# Patient Record
Sex: Female | Born: 1985 | Hispanic: No | Marital: Single | Smoking: Current every day smoker
Health system: Southern US, Community
[De-identification: ages and names within clinical notes are randomized; demographics above are authoritative.]

## PROBLEM LIST (undated history)

## (undated) ENCOUNTER — Inpatient Hospital Stay (HOSPITAL_COMMUNITY): Payer: Self-pay

## (undated) DIAGNOSIS — F329 Major depressive disorder, single episode, unspecified: Secondary | ICD-10-CM

## (undated) DIAGNOSIS — F99 Mental disorder, not otherwise specified: Secondary | ICD-10-CM

## (undated) DIAGNOSIS — Z7289 Other problems related to lifestyle: Secondary | ICD-10-CM

## (undated) DIAGNOSIS — F4310 Post-traumatic stress disorder, unspecified: Secondary | ICD-10-CM

## (undated) DIAGNOSIS — F32A Depression, unspecified: Secondary | ICD-10-CM

## (undated) DIAGNOSIS — R45851 Suicidal ideations: Secondary | ICD-10-CM

## (undated) DIAGNOSIS — F319 Bipolar disorder, unspecified: Secondary | ICD-10-CM

## (undated) HISTORY — PX: FOOT SURGERY: SHX648

## (undated) HISTORY — DX: Suicidal ideations: R45.851

---
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
## 2007-03-08 ENCOUNTER — Encounter: Admission: RE | Admit: 2007-03-08 | Discharge: 2007-04-02 | Payer: Self-pay | Admitting: Podiatry

## 2007-04-03 LAB — SICKLE CELL SCREEN: SICKLE CELL SCREEN: NORMAL

## 2007-04-30 ENCOUNTER — Emergency Department (HOSPITAL_COMMUNITY): Admission: EM | Admit: 2007-04-30 | Discharge: 2007-04-30 | Payer: Self-pay | Admitting: Emergency Medicine

## 2010-02-16 ENCOUNTER — Emergency Department (HOSPITAL_COMMUNITY)
Admission: EM | Admit: 2010-02-16 | Discharge: 2010-02-17 | Disposition: A | Discharge status: Other Acute Inpatient Hospital | Payer: Self-pay | Source: Home / Self Care | Admitting: Emergency Medicine

## 2010-02-17 ENCOUNTER — Inpatient Hospital Stay (HOSPITAL_COMMUNITY)
Admission: AD | Admit: 2010-02-17 | Discharge: 2010-02-22 | Payer: Self-pay | Source: Home / Self Care | Attending: Psychiatry | Admitting: Psychiatry

## 2010-02-22 LAB — ETHANOL
Alcohol, Ethyl (B): 296 mg/dL — ABNORMAL HIGH (ref 0–10)
Alcohol, Ethyl (B): 109 mg/dL — ABNORMAL HIGH (ref 0–10)

## 2010-02-22 LAB — BASIC METABOLIC PANEL
BUN: 3 mg/dL — ABNORMAL LOW (ref 6–23)
CO2: 27 mEq/L (ref 19–32)
Calcium: 9.3 mg/dL (ref 8.4–10.5)
Chloride: 113 mEq/L — ABNORMAL HIGH (ref 96–112)
Creatinine, Ser: 0.82 mg/dL (ref 0.4–1.2)
GFR calc Af Amer: >60 mL/min (ref >60)
GFR calc non Af Amer: >60 mL/min (ref >60)
Glucose, Bld: 108 mg/dL — ABNORMAL HIGH (ref 70–99)
Potassium: 3.7 mEq/L (ref 3.5–5.1)
Sodium: 145 mEq/L (ref 135–145)

## 2010-02-22 LAB — RAPID URINE DRUG SCREEN, HOSP PERFORMED
Amphetamines: NOT DETECTED
Barbiturates: NOT DETECTED
Benzodiazepines: NOT DETECTED
Cocaine: NOT DETECTED
Opiates: NOT DETECTED
Tetrahydrocannabinol: NOT DETECTED

## 2010-02-22 LAB — URINALYSIS, ROUTINE W REFLEX MICROSCOPIC
Bilirubin Urine: NEGATIVE
Hgb urine dipstick: NEGATIVE
Ketones, ur: NEGATIVE mg/dL
Nitrite: NEGATIVE
Protein, ur: NEGATIVE mg/dL
Specific Gravity, Urine: 1.017 (ref 1.005–1.030)
Urine Glucose, Fasting: NEGATIVE mg/dL
Urobilinogen, UA: 0.2 mg/dL (ref 0.0–1.0)
pH: 5.5 (ref 5.0–8.0)

## 2010-02-22 LAB — URINE MICROSCOPIC-ADD ON

## 2010-02-22 LAB — CBC
HCT: 42 % (ref 36.0–46.0)
Hemoglobin: 14.7 g/dL (ref 12.0–15.0)
MCH: 31.2 pg (ref 26.0–34.0)
MCHC: 35 g/dL (ref 30.0–36.0)
MCV: 89.2 fL (ref 78.0–100.0)
Platelets: 294 10*3/uL (ref 150–400)
RBC: 4.71 MIL/uL (ref 3.87–5.11)
RDW: 11.9 % (ref 11.5–15.5)
WBC: 6.9 10*3/uL (ref 4.0–10.5)

## 2010-02-22 LAB — DIFFERENTIAL
Basophils Absolute: 0.1 10*3/uL (ref 0.0–0.1)
Basophils Relative: 1 % (ref 0–1)
Eosinophils Absolute: 0.1 10*3/uL (ref 0.0–0.7)
Eosinophils Relative: 1 % (ref 0–5)
Lymphocytes Relative: 40 % (ref 12–46)
Lymphs Abs: 2.8 10*3/uL (ref 0.7–4.0)
Monocytes Absolute: 0.4 10*3/uL (ref 0.1–1.0)
Monocytes Relative: 5 % (ref 3–12)
Neutro Abs: 3.6 10*3/uL (ref 1.7–7.7)
Neutrophils Relative %: 53 % (ref 43–77)

## 2010-02-22 LAB — PREGNANCY, URINE: Preg Test, Ur: NEGATIVE

## 2010-02-22 LAB — TRICYCLICS SCREEN, URINE: TCA Scrn: NOT DETECTED

## 2010-03-04 NOTE — Discharge Summary (Addendum)
NAMESALLEE, HOGREFE NO.:  [id number]  MEDICAL RECORD NO.:  [id number]          PATIENT TYPE:  IPS  LOCATION:  0306                          FACILITY:  [facility]  PHYSICIAN:  Eulogio Ditch, MD DATE OF BIRTH:  04/01/1985  DATE OF ADMISSION:  02/17/2010 DATE OF DISCHARGE:  02/22/2010                              DISCHARGE SUMMARY   IDENTIFYING INFORMATION:  This is a 25 year old single African American female.  This is a voluntary admission.  HISTORY OF PRESENT ILLNESS:  Leathie presented by way of our emergency room where she was brought by EMS after she had been drinking alcohol and stated that she was depressed and felt suicidal.  She called EMS and reported this then hung up the phone.  The call was subsequently traced, found by EMS, and she was brought to the emergency room.  She was quite intoxicated and combative and verbally abusive at the time she was brought to the emergency room.  She has a history of major depressive disorder and possibly schizoaffective disorder and is followed at [facility].  She has a history of previous admissions to our adolescent unit, the last one at age 39 and a previous admission to [facility] [place] 68.  Her closest sibling is a sister who lives in [place].  She lives alone here in her own apartment.  Her parents died together when the patient was 25 years old from a drug overdose.  She had been in and out of foster care system during her childhood and teenage years.  She admits to drinking heavily, but denies any drug use.  MEDICAL EVALUATION AND DIAGNOSTIC STUDIES:  She was medically evaluated in the emergency room where she was found to have an alcohol level of 296 mg/dL.  Basic metabolic profile unremarkable.  BUN 3, creatinine 0.82.  Urine pregnancy test negative urine.  Drug screen also negative.  ADMITTING MEDICATIONS: 1. Trazodone 50 mg at bedtime. 2. Had previously  been on Abilify which had been stopped over a year     ago.  DRUG ALLERGIES:  None.  ADMITTING MENTAL STATUS EXAM:  Fully alert female, cooperative, blunt affect.  Fairly good eye contact.  Speech soft, soft in tone, barely audible sometimes, coherent, goal directed.  Affect depressed.  This is her mood.  Denying suicidal thoughts and blaming her actions and behavior on the alcohol.  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  DIAGNOSIS:  AXIS I:  Major depressive disorder recurrent, rule out schizoaffective disorder with paranoid thinking, alcohol abuse and rule out alcohol induced mood disorder. AXIS II: Borderline intellectual functioning. AXIS III: Asthma. AXIS IV: Burden of chronic mental illness and significant social isolation. AXIS V: Current 40.  COURSE OF HOSPITALIZATION:  She was admitted to our dual diagnosis unit and initially started on Seroquel 200 mg p.o. q.h.s. and trazodone 50 mg h.s. p.r.n. insomnia.  She did not require detox from alcohol while here.  Her affect and demeanor were very quiet and she did appear to be quite depressed.  She expressed that she only feels suicidal when drinking alone  and also admitted that she was kind of depressed, feeling that her life was going nowhere, always living alone, having no one except her sister over in [place].  After discharge, she planned on going staying with her sister about a week so that she did not need to be alone.  She was looking for work but was unable to find any.  She had previously been attending the [employer] [employer] [facility] [employer] [facility] [employer] [facility], but had stopped going there.  Because of her complaints of depression, she was started on Celexa 20 mg daily which, records indicated, that she had done well on previously.  She was started on Celexa on the 14th.  By the 15th, her affect was full, looking much better, requesting to go home and planning on going to stay with her sister.  Our counselor made  contact with the patient's sister, Wynona Canes, via phone.  Her sister expressed her concerns that something might happen to Hilja while she was drinking. The sister lives in [place] and is not physically close and was not able to identify anyone that could monitor the patient's behavior but believed the patient had no access to weapons which the patient had confirmed.  Her sister was receptive to receiving suicide prevention information.  DISCHARGE MENTAL STATUS EXAM:  By the 16th, she was in full contact with reality, pleasant, cooperative.  Affect broader she was looking forward to going to her own home, and then she was getting a ride to her sister's house.  No suicidal thoughts, positive thoughts about getting a job and going and visiting her sister, agreed to follow up with [facility].  DISCHARGE DIAGNOSIS:  AXIS I:  Depressive disorder NOS, alcohol abuse. AXIS II:  Borderline intellectual functioning. AXIS III:  No diagnosis. AXIS IV: Significant social isolation, having a supportive sister in [place] is an asset. AXIS V: Current 60, past year not known.  DISCHARGE CONDITION:  Stable.  DISCHARGE PLAN:  Follow up with [facility] and at [facility] on January 23 at 9:00 a.m. with Berton Mount, RN.  DISCHARGE MEDICATIONS: 1. Citalopram 20 mg daily. 2. Quetiapine XR 200 mg daily at bedtime. 3. Trazodone 50 mg h.s. p.r.n. insomnia.  She was instructed to stop     Abilify.     Margaret A. Lorin Picket, N.P.   ______________________________ Eulogio Ditch, MD    MAS/MEDQ  D:  02/25/2010  T:  02/25/2010  Job:  [id number]  Electronically Signed by Kari Baars N.P. on 02/26/2010 11:41:24 AM Electronically Signed by Eulogio Ditch  on 03/04/2010 05:31:37 AM

## 2010-03-10 ENCOUNTER — Emergency Department (HOSPITAL_COMMUNITY)
Admission: EM | Admit: 2010-03-10 | Discharge: 2010-03-11 | Disposition: A | Discharge status: Home / Self Care | Payer: Medicare Other | Attending: Emergency Medicine | Admitting: Emergency Medicine

## 2010-03-10 DIAGNOSIS — F1010 Alcohol abuse, uncomplicated: Secondary | ICD-10-CM | POA: Insufficient documentation

## 2010-03-10 DIAGNOSIS — F39 Unspecified mood [affective] disorder: Secondary | ICD-10-CM

## 2010-03-10 DIAGNOSIS — F3289 Other specified depressive episodes: Secondary | ICD-10-CM | POA: Insufficient documentation

## 2010-03-10 DIAGNOSIS — F329 Major depressive disorder, single episode, unspecified: Secondary | ICD-10-CM | POA: Insufficient documentation

## 2010-04-06 ENCOUNTER — Emergency Department (HOSPITAL_COMMUNITY)
Admission: EM | Admit: 2010-04-06 | Discharge: 2010-04-06 | Disposition: A | Discharge status: Other Acute Inpatient Hospital | Payer: Medicare Other | Source: Home / Self Care | Attending: Emergency Medicine | Admitting: Emergency Medicine

## 2010-04-06 ENCOUNTER — Inpatient Hospital Stay (HOSPITAL_COMMUNITY)
Admission: AD | Admit: 2010-04-06 | Discharge: 2010-04-09 | DRG: 885 | Disposition: A | Discharge status: Home / Self Care | Payer: Medicare Other | Source: Ambulatory Visit | Attending: Psychiatry | Admitting: Psychiatry

## 2010-04-06 DIAGNOSIS — F3289 Other specified depressive episodes: Secondary | ICD-10-CM | POA: Insufficient documentation

## 2010-04-06 DIAGNOSIS — Z609 Problem related to social environment, unspecified: Secondary | ICD-10-CM

## 2010-04-06 DIAGNOSIS — F17200 Nicotine dependence, unspecified, uncomplicated: Secondary | ICD-10-CM

## 2010-04-06 DIAGNOSIS — Z733 Stress, not elsewhere classified: Secondary | ICD-10-CM

## 2010-04-06 DIAGNOSIS — J45909 Unspecified asthma, uncomplicated: Secondary | ICD-10-CM

## 2010-04-06 DIAGNOSIS — R45851 Suicidal ideations: Secondary | ICD-10-CM | POA: Insufficient documentation

## 2010-04-06 DIAGNOSIS — T43502A Poisoning by unspecified antipsychotics and neuroleptics, intentional self-harm, initial encounter: Secondary | ICD-10-CM | POA: Insufficient documentation

## 2010-04-06 DIAGNOSIS — T438X2A Poisoning by other psychotropic drugs, intentional self-harm, initial encounter: Secondary | ICD-10-CM | POA: Insufficient documentation

## 2010-04-06 DIAGNOSIS — Z79899 Other long term (current) drug therapy: Secondary | ICD-10-CM | POA: Insufficient documentation

## 2010-04-06 DIAGNOSIS — F329 Major depressive disorder, single episode, unspecified: Principal | ICD-10-CM

## 2010-04-06 DIAGNOSIS — T43294A Poisoning by other antidepressants, undetermined, initial encounter: Secondary | ICD-10-CM | POA: Insufficient documentation

## 2010-04-06 DIAGNOSIS — F1020 Alcohol dependence, uncomplicated: Secondary | ICD-10-CM

## 2010-04-06 LAB — RAPID URINE DRUG SCREEN, HOSP PERFORMED
Amphetamines: NOT DETECTED
Barbiturates: NOT DETECTED
Benzodiazepines: NOT DETECTED
Cocaine: NOT DETECTED
Opiates: NOT DETECTED
Tetrahydrocannabinol: NOT DETECTED

## 2010-04-06 LAB — DIFFERENTIAL
Basophils Absolute: 0 10*3/uL (ref 0.0–0.1)
Basophils Relative: 0 % (ref 0–1)
Eosinophils Absolute: 0 10*3/uL (ref 0.0–0.7)
Eosinophils Relative: 0 % (ref 0–5)
Lymphocytes Relative: 28 % (ref 12–46)
Lymphs Abs: 2.5 10*3/uL (ref 0.7–4.0)
Monocytes Absolute: 0.6 10*3/uL (ref 0.1–1.0)
Monocytes Relative: 7 % (ref 3–12)
Neutro Abs: 5.7 10*3/uL (ref 1.7–7.7)
Neutrophils Relative %: 65 % (ref 43–77)

## 2010-04-06 LAB — CBC
HCT: 39 % (ref 36.0–46.0)
Hemoglobin: 13.1 g/dL (ref 12.0–15.0)
MCH: 29.6 pg (ref 26.0–34.0)
MCHC: 33.6 g/dL (ref 30.0–36.0)
MCV: 88.2 fL (ref 78.0–100.0)
Platelets: 259 10*3/uL (ref 150–400)
RBC: 4.42 MIL/uL (ref 3.87–5.11)
RDW: 11.7 % (ref 11.5–15.5)
WBC: 8.8 10*3/uL (ref 4.0–10.5)

## 2010-04-06 LAB — URINALYSIS, ROUTINE W REFLEX MICROSCOPIC
Bilirubin Urine: NEGATIVE
Hgb urine dipstick: NEGATIVE
Ketones, ur: NEGATIVE mg/dL
Nitrite: NEGATIVE
Protein, ur: NEGATIVE mg/dL
Specific Gravity, Urine: 1.011 (ref 1.005–1.030)
Urine Glucose, Fasting: NEGATIVE mg/dL
Urobilinogen, UA: 0.2 mg/dL (ref 0.0–1.0)
pH: 5.5 (ref 5.0–8.0)

## 2010-04-06 LAB — COMPREHENSIVE METABOLIC PANEL
ALT: 10 U/L (ref 0–35)
AST: 15 U/L (ref 0–37)
Albumin: 4.4 g/dL (ref 3.5–5.2)
Alkaline Phosphatase: 51 U/L (ref 39–117)
BUN: 5 mg/dL — ABNORMAL LOW (ref 6–23)
CO2: 22 mEq/L (ref 19–32)
Calcium: 9 mg/dL (ref 8.4–10.5)
Chloride: 108 mEq/L (ref 96–112)
Creatinine, Ser: 0.79 mg/dL (ref 0.4–1.2)
GFR calc Af Amer: >60 mL/min (ref >60)
GFR calc non Af Amer: >60 mL/min (ref >60)
Glucose, Bld: 99 mg/dL (ref 70–99)
Potassium: 3.6 mEq/L (ref 3.5–5.1)
Sodium: 138 mEq/L (ref 135–145)
Total Bilirubin: 0.7 mg/dL (ref 0.3–1.2)
Total Protein: 7.1 g/dL (ref 6.0–8.3)

## 2010-04-06 LAB — ETHANOL
Alcohol, Ethyl (B): 172 mg/dL — ABNORMAL HIGH (ref 0–10)
Alcohol, Ethyl (B): 69 mg/dL — ABNORMAL HIGH (ref 0–10)

## 2010-04-06 LAB — ACETAMINOPHEN LEVEL: Acetaminophen (Tylenol), Serum: <10 ug/mL — ABNORMAL LOW (ref 10–30)

## 2010-04-06 LAB — PREGNANCY, URINE: Preg Test, Ur: NEGATIVE

## 2010-04-06 LAB — SALICYLATE LEVEL: Salicylate Lvl: <4 mg/dL (ref 2.8–20.0)

## 2010-04-07 DIAGNOSIS — F329 Major depressive disorder, single episode, unspecified: Secondary | ICD-10-CM

## 2010-04-07 DIAGNOSIS — F1010 Alcohol abuse, uncomplicated: Secondary | ICD-10-CM

## 2010-04-07 NOTE — H&P (Addendum)
NAME:  Nichole Lopez, Nichole Lopez NO.:  [id number]  MEDICAL RECORD NO.:  [id number]           PATIENT TYPE:  I  LOCATION:  0306                          FACILITY:  [facility]  PHYSICIAN:  Anselm Jungling, MD  DATE OF BIRTH:  [birth year]  DATE OF ADMISSION:  04/06/2010 DATE OF DISCHARGE:                      PSYCHIATRIC ADMISSION ASSESSMENT   PSYCHIATRIC ADMISSION ASSESSMENT:  Admission information:  The patient is a 25 year old African American female patient who presented to the [facility] emergency room after overdosing on her medication after an argument with her boyfriend while quite inebriated.  PAST PSYCHIATRIC HISTORY:  This is the second admission at [facility] for Nichole Lopez.  Her last admission was in January of this year and she was admitted and treated.  PAST PSYCHIATRIC HISTORY:  __________  one admission at age 89, second admission in child adolescent unit at 46.  She has finished the eighth grade, was not in regular classes and has no GED.  She is the second oldest of 4 children.  Older sister and two younger brothers.  Two brothers are in [place].  The sister is in [place].  She lives alone and is followed as a payee from DSS by Charlette Caffey and goes through [facility] and gets her [facility] through the [facility].  FAMILY HISTORY:  Parents died together when the patient was 25 years old from a drug overdose.  She has been in and out of foster care during that time.  She is currently on disability for major depressive disorder, schizoaffective disorder, paranoid schizophrenia and possible bipolar disorder.  ALCOHOL AND DRUG HISTORY:  The patient says she had her first alcohol at age 34 and denies any drug usage.  She smokes half a pack a day.  She sees a medical provider at [facility].  MEDICAL PROBLEMS:  Significant for asthma.  CURRENT MEDICATIONS: 1. Citalopram 20 mg p.o. daily. 2. Quetiapine XR 200 mg  daily at bedtime. 3. Trazodone 50 mg p.r.n. insomnia.  DRUG ALLERGIES:  No known drug allergies.  The patient was seen in the emergency room and taken there by EMS at 3 a.m. after an argument with her boyfriend where she took 11 trazodone pills after drinking quite heavily.  Physical exam was unremarkable and the patient appeared depressed.  SIGNIFICANT LABORATORY:  Included negative urine pregnancy, negative urine microscopic.  Urine drug screen was negative.  CBC was normal. Acetaminophen level less than 10.  Comprehensive metabolic panel was normal with a low BUN of 5.  Salicylate was less than 4 and blood alcohol was significant at 172.  The patient denies any previous history of seizure disorder or withdrawal symptoms.  MENTAL STATUS EXAM:  The patient states that she was not suicidal at the time, she just wanted to go to the emergency room to have her stomach pumped and then go home.  She is wearing paper scrubs, appears depressed.  She is cooperative.  Hair is combed.  She makes poor eye contact.  Speech is clear and goal oriented and is coherent.  Mood is depressed.  Affect is restricted.  Thought process appears linear however, she has absolutely no  insight and very poor judgment.  She has obvious decreased intellectual functioning.  ASSESSMENT:  Axis I:  Major depressive disorder, not otherwise specified.  Alcohol abuse. Axis II:  Borderline intellectual functioning. Axis III:  None. Status post overdose without significant sequelae. Axis IV:  Significant social isolation. Axis V:  40, past year 74.  PLAN:  Admit for stabilization and to follow for withdrawal symptoms. Case manager and therapist will contact DSS and [facility] to follow up on more supportive interaction between the patient and client.  Estimated length of stay 2-4 days.    ______________________________ Verne Spurr, PA   ______________________________ Anselm Jungling,  MD    NM/MEDQ  D:  04/07/2010  T:  04/07/2010  Job:  [id number]  Electronically Signed by Geralyn Flash MD on 04/07/2010 12:56:43 PM Electronically Signed by Verne Spurr  on 05/18/2010 09:48:57 AM

## 2010-04-08 DIAGNOSIS — F329 Major depressive disorder, single episode, unspecified: Secondary | ICD-10-CM

## 2010-04-12 NOTE — Discharge Summary (Signed)
NAME:  [name], [name] NO.:  [id number]  MEDICAL RECORD NO.:  [id number]           PATIENT TYPE:  I  LOCATION:  0306                          FACILITY:  BH  PHYSICIAN:  Anselm Jungling, MD  DATE OF BIRTH:  02/01/1986  DATE OF ADMISSION:  04/06/2010 DATE OF DISCHARGE:  04/09/2010                              DISCHARGE SUMMARY   IDENTIFYING DATA/REASON FOR ADMISSION:  This was an inpatient psychiatric admission for Nichole Lopez, a 25 year old single African American female and client of the [facility]. She was admitted because of increasing features of depression.  Please refer to the admission note for further details pertaining to the symptoms, circumstances and history that led to her hospitalization. She was given an initial Axis I diagnosis of depressive disorder NOS and history of alcohol abuse.  MEDICAL/LABORATORY:  The patient was medically and physically assessed by the psychiatric nurse practitioner.  She had come to [place] through the emergency department where she had been evaluated after an overdose. There were no significant medical issues during her stay.  HOSPITAL COURSE:  The patient was admitted to the adult inpatient psychiatric service.  She presented as a well-nourished, normally- developed young adult female who was pleasant, but depressed and very sad.  She spoke with a soft voice and tended to avoid eye contact.  She appeared very shy and under confident.  She stated that it was her idea to come into this inpatient setting to get help, but she could not really state why.  She acknowledged having taken some form of overdose prior to admission, but stated that she did not know why she had tried to kill herself.  She indicated fairly quickly that she did not want to remain in the inpatient program, but she was felt to be in need of further evaluation and treatment.  She was continued on her usual regimen of  Seroquel 200 mg nightly.  She was also given trazodone to assist with sleep which was helpful.  We contacted her assertive community treatment team and the case manager coordinated the aftercare and discharge plan with them.  The patient appeared appropriate for discharge on the fourth hospital day.  She had been consistently absent suicidal ideation through that time and was ready and willing to continue working with the [employer].  Other plans of hers that indicated strong future orientation included a thought of returning to school at [employer] to earn her GED.  She agreed to the following aftercare plan.  AFTERCARE:  The patient was to be picked up by [facility] immediately upon discharge.  She was also to follow up with [employer] with an appointment on April 14, 2010 at 4 p.m.  DISCHARGE MEDICATIONS:  Seroquel 200 mg nightly.  DISCHARGE DIAGNOSES:  AXIS I:  Depressive disorder not otherwise specified and history of alcohol dependence. AXIS II:  Deferred. AXIS III:  No acute or chronic illnesses. AXIS IV:  Stressors severe. AXIS V:  Global assessment of functioning on discharge 47.     Anselm Jungling, MD  SPB/MEDQ  D:  04/09/2010  T:  04/10/2010  Job:  [id number]  Electronically Signed by Geralyn Flash MD on 04/12/2010 11:04:32 AM
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
## 2010-07-15 ENCOUNTER — Emergency Department (HOSPITAL_COMMUNITY)
Admission: EM | Admit: 2010-07-15 | Discharge: 2010-07-15 | Disposition: A | Discharge status: Home / Self Care | Payer: Medicare Other | Attending: Emergency Medicine | Admitting: Emergency Medicine

## 2010-07-15 DIAGNOSIS — F329 Major depressive disorder, single episode, unspecified: Secondary | ICD-10-CM | POA: Insufficient documentation

## 2010-07-15 DIAGNOSIS — F458 Other somatoform disorders: Secondary | ICD-10-CM | POA: Insufficient documentation

## 2010-07-15 DIAGNOSIS — R6884 Jaw pain: Secondary | ICD-10-CM | POA: Insufficient documentation

## 2010-07-15 DIAGNOSIS — F3289 Other specified depressive episodes: Secondary | ICD-10-CM | POA: Insufficient documentation

## 2010-07-22 ENCOUNTER — Inpatient Hospital Stay (INDEPENDENT_AMBULATORY_CARE_PROVIDER_SITE_OTHER)
Admission: RE | Admit: 2010-07-22 | Discharge: 2010-07-22 | Disposition: A | Discharge status: Home / Self Care | Payer: Medicare Other | Source: Ambulatory Visit | Attending: Family Medicine | Admitting: Family Medicine

## 2010-07-22 DIAGNOSIS — M26609 Unspecified temporomandibular joint disorder, unspecified side: Secondary | ICD-10-CM

## 2010-08-25 ENCOUNTER — Inpatient Hospital Stay (INDEPENDENT_AMBULATORY_CARE_PROVIDER_SITE_OTHER)
Admission: RE | Admit: 2010-08-25 | Discharge: 2010-08-25 | Disposition: A | Discharge status: Home / Self Care | Payer: Medicare Other | Source: Ambulatory Visit | Attending: Family Medicine | Admitting: Family Medicine

## 2010-08-25 DIAGNOSIS — R109 Unspecified abdominal pain: Secondary | ICD-10-CM

## 2010-08-25 LAB — POCT URINALYSIS DIP (DEVICE)
Bilirubin Urine: NEGATIVE
Glucose, UA: NEGATIVE mg/dL
Hgb urine dipstick: NEGATIVE
Leukocytes, UA: NEGATIVE
Nitrite: NEGATIVE
Protein, ur: NEGATIVE mg/dL
Specific Gravity, Urine: >=1.03 (ref 1.005–1.030)
Urobilinogen, UA: 0.2 mg/dL (ref 0.0–1.0)
pH: 5 (ref 5.0–8.0)

## 2010-08-25 LAB — POCT PREGNANCY, URINE: Preg Test, Ur: NEGATIVE

## 2010-09-14 ENCOUNTER — Emergency Department (HOSPITAL_COMMUNITY)
Admission: EM | Admit: 2010-09-14 | Discharge: 2010-09-15 | Disposition: A | Discharge status: Home / Self Care | Payer: Medicare Other | Attending: Emergency Medicine | Admitting: Emergency Medicine

## 2010-09-14 DIAGNOSIS — F1010 Alcohol abuse, uncomplicated: Secondary | ICD-10-CM | POA: Insufficient documentation

## 2010-09-14 DIAGNOSIS — IMO0002 Reserved for concepts with insufficient information to code with codable children: Secondary | ICD-10-CM | POA: Insufficient documentation

## 2010-09-15 LAB — CBC
MCV: 85.2 fL (ref 78.0–100.0)
Platelets: 305 10*3/uL (ref 150–400)
RBC: 4.99 MIL/uL (ref 3.87–5.11)
RDW: 12.3 % (ref 11.5–15.5)
WBC: 7.7 10*3/uL (ref 4.0–10.5)

## 2010-09-15 LAB — RAPID URINE DRUG SCREEN, HOSP PERFORMED
Barbiturates: NOT DETECTED
Benzodiazepines: NOT DETECTED
Cocaine: NOT DETECTED
Tetrahydrocannabinol: NOT DETECTED

## 2010-09-15 LAB — COMPREHENSIVE METABOLIC PANEL
ALT: 8 U/L (ref 0–35)
AST: 13 U/L (ref 0–37)
Albumin: 5 g/dL (ref 3.5–5.2)
Alkaline Phosphatase: 55 U/L (ref 39–117)
CO2: 20 mEq/L (ref 19–32)
Chloride: 105 mEq/L (ref 96–112)
GFR calc non Af Amer: >60 mL/min (ref >60)
Potassium: 3.8 mEq/L (ref 3.5–5.1)
Sodium: 139 mEq/L (ref 135–145)
Total Bilirubin: 0.3 mg/dL (ref 0.3–1.2)

## 2010-09-15 LAB — DIFFERENTIAL
Basophils Absolute: 0.1 10*3/uL (ref 0.0–0.1)
Eosinophils Absolute: 0 10*3/uL (ref 0.0–0.7)
Eosinophils Relative: 0 % (ref 0–5)
Lymphs Abs: 2.4 10*3/uL (ref 0.7–4.0)
Neutrophils Relative %: 63 % (ref 43–77)

## 2010-09-24 ENCOUNTER — Emergency Department (HOSPITAL_COMMUNITY)
Admission: EM | Admit: 2010-09-24 | Discharge: 2010-09-25 | Disposition: A | Discharge status: Home / Self Care | Payer: Medicare Other | Attending: Emergency Medicine | Admitting: Emergency Medicine

## 2010-09-24 DIAGNOSIS — R4182 Altered mental status, unspecified: Secondary | ICD-10-CM | POA: Insufficient documentation

## 2010-09-24 DIAGNOSIS — F1010 Alcohol abuse, uncomplicated: Secondary | ICD-10-CM | POA: Insufficient documentation

## 2010-09-24 DIAGNOSIS — F329 Major depressive disorder, single episode, unspecified: Secondary | ICD-10-CM | POA: Insufficient documentation

## 2010-09-24 DIAGNOSIS — F3289 Other specified depressive episodes: Secondary | ICD-10-CM | POA: Insufficient documentation

## 2010-09-24 LAB — URINALYSIS, ROUTINE W REFLEX MICROSCOPIC
Glucose, UA: NEGATIVE mg/dL
Leukocytes, UA: NEGATIVE
pH: 6.5 (ref 5.0–8.0)

## 2010-09-24 LAB — DIFFERENTIAL
Basophils Relative: 1 % (ref 0–1)
Eosinophils Absolute: 0 10*3/uL (ref 0.0–0.7)
Eosinophils Relative: 1 % (ref 0–5)
Lymphs Abs: 2.3 10*3/uL (ref 0.7–4.0)
Monocytes Absolute: 0.3 10*3/uL (ref 0.1–1.0)
Monocytes Relative: 5 % (ref 3–12)
Neutrophils Relative %: 53 % (ref 43–77)

## 2010-09-24 LAB — CBC
MCH: 30.1 pg (ref 26.0–34.0)
MCHC: 35.4 g/dL (ref 30.0–36.0)
MCV: 85.1 fL (ref 78.0–100.0)
Platelets: 285 10*3/uL (ref 150–400)
RBC: 4.82 MIL/uL (ref 3.87–5.11)

## 2010-09-24 LAB — RAPID URINE DRUG SCREEN, HOSP PERFORMED
Cocaine: NOT DETECTED
Opiates: NOT DETECTED

## 2010-09-24 LAB — BASIC METABOLIC PANEL
BUN: 4 mg/dL — ABNORMAL LOW (ref 6–23)
CO2: 20 mEq/L (ref 19–32)
Calcium: 9.7 mg/dL (ref 8.4–10.5)
Glucose, Bld: 100 mg/dL — ABNORMAL HIGH (ref 70–99)
Sodium: 139 mEq/L (ref 135–145)

## 2010-09-24 LAB — PREGNANCY, URINE: Preg Test, Ur: NEGATIVE

## 2010-09-24 LAB — URINE MICROSCOPIC-ADD ON

## 2010-10-18 ENCOUNTER — Emergency Department (HOSPITAL_COMMUNITY)
Admission: EM | Admit: 2010-10-18 | Discharge: 2010-10-19 | Disposition: A | Discharge status: Home / Self Care | Payer: Medicare Other | Attending: Emergency Medicine | Admitting: Emergency Medicine

## 2010-10-18 DIAGNOSIS — F411 Generalized anxiety disorder: Secondary | ICD-10-CM | POA: Insufficient documentation

## 2010-10-18 DIAGNOSIS — F1010 Alcohol abuse, uncomplicated: Secondary | ICD-10-CM | POA: Insufficient documentation

## 2010-10-18 DIAGNOSIS — IMO0002 Reserved for concepts with insufficient information to code with codable children: Secondary | ICD-10-CM | POA: Insufficient documentation

## 2010-10-18 LAB — URINALYSIS, ROUTINE W REFLEX MICROSCOPIC
Glucose, UA: NEGATIVE mg/dL
Leukocytes, UA: NEGATIVE
Specific Gravity, Urine: 1.006 (ref 1.005–1.030)
pH: 6 (ref 5.0–8.0)

## 2010-10-18 LAB — CBC
Hemoglobin: 13.9 g/dL (ref 12.0–15.0)
MCH: 30.5 pg (ref 26.0–34.0)
MCHC: 35 g/dL (ref 30.0–36.0)

## 2010-10-18 LAB — DIFFERENTIAL
Basophils Relative: 1 % (ref 0–1)
Eosinophils Absolute: 0 10*3/uL (ref 0.0–0.7)
Eosinophils Relative: 0 % (ref 0–5)
Monocytes Absolute: 0.2 10*3/uL (ref 0.1–1.0)
Monocytes Relative: 4 % (ref 3–12)
Neutro Abs: 3.5 10*3/uL (ref 1.7–7.7)

## 2010-10-18 LAB — RAPID URINE DRUG SCREEN, HOSP PERFORMED
Amphetamines: NOT DETECTED
Benzodiazepines: NOT DETECTED
Cocaine: NOT DETECTED
Opiates: NOT DETECTED

## 2010-10-18 LAB — COMPREHENSIVE METABOLIC PANEL
AST: 19 U/L (ref 0–37)
CO2: 21 mEq/L (ref 19–32)
Calcium: 9.4 mg/dL (ref 8.4–10.5)
Creatinine, Ser: 0.6 mg/dL (ref 0.50–1.10)
GFR calc Af Amer: >60 mL/min (ref >60)
GFR calc non Af Amer: >60 mL/min (ref >60)

## 2010-10-18 LAB — ACETAMINOPHEN LEVEL: Acetaminophen (Tylenol), Serum: <15 ug/mL (ref 10–30)

## 2010-11-01 LAB — STREP A DNA PROBE: Group A Strep Probe: NEGATIVE

## 2010-12-30 ENCOUNTER — Inpatient Hospital Stay (HOSPITAL_COMMUNITY)
Admission: AD | Admit: 2010-12-30 | Discharge: 2011-01-03 | DRG: 882 | Disposition: A | Discharge status: Home / Self Care | Payer: Medicare Other | Attending: Psychiatry | Admitting: Psychiatry

## 2010-12-30 ENCOUNTER — Encounter (HOSPITAL_COMMUNITY): Payer: Self-pay | Admitting: Behavioral Health

## 2010-12-30 ENCOUNTER — Emergency Department (HOSPITAL_COMMUNITY)
Admission: EM | Admit: 2010-12-30 | Discharge: 2010-12-30 | Disposition: A | Discharge status: Home / Self Care | Payer: Medicare Other | Source: Home / Self Care | Attending: Emergency Medicine | Admitting: Emergency Medicine

## 2010-12-30 ENCOUNTER — Encounter: Payer: Self-pay | Admitting: Emergency Medicine

## 2010-12-30 DIAGNOSIS — Z818 Family history of other mental and behavioral disorders: Secondary | ICD-10-CM

## 2010-12-30 DIAGNOSIS — X838XXA Intentional self-harm by other specified means, initial encounter: Secondary | ICD-10-CM

## 2010-12-30 DIAGNOSIS — F1994 Other psychoactive substance use, unspecified with psychoactive substance-induced mood disorder: Secondary | ICD-10-CM

## 2010-12-30 DIAGNOSIS — F609 Personality disorder, unspecified: Secondary | ICD-10-CM

## 2010-12-30 DIAGNOSIS — S61209A Unspecified open wound of unspecified finger without damage to nail, initial encounter: Secondary | ICD-10-CM

## 2010-12-30 DIAGNOSIS — IMO0002 Reserved for concepts with insufficient information to code with codable children: Secondary | ICD-10-CM

## 2010-12-30 DIAGNOSIS — S41109A Unspecified open wound of unspecified upper arm, initial encounter: Secondary | ICD-10-CM

## 2010-12-30 DIAGNOSIS — F4310 Post-traumatic stress disorder, unspecified: Principal | ICD-10-CM

## 2010-12-30 DIAGNOSIS — Z6379 Other stressful life events affecting family and household: Secondary | ICD-10-CM

## 2010-12-30 DIAGNOSIS — F1010 Alcohol abuse, uncomplicated: Secondary | ICD-10-CM

## 2010-12-30 DIAGNOSIS — F603 Borderline personality disorder: Secondary | ICD-10-CM

## 2010-12-30 DIAGNOSIS — R45851 Suicidal ideations: Secondary | ICD-10-CM

## 2010-12-30 DIAGNOSIS — F509 Eating disorder, unspecified: Secondary | ICD-10-CM

## 2010-12-30 HISTORY — DX: Major depressive disorder, single episode, unspecified: F32.9

## 2010-12-30 HISTORY — DX: Mental disorder, not otherwise specified: F99

## 2010-12-30 HISTORY — DX: Depression, unspecified: F32.A

## 2010-12-30 LAB — DIFFERENTIAL
Basophils Relative: 1 % (ref 0–1)
Lymphocytes Relative: 31 % (ref 12–46)
Lymphs Abs: 1.8 10*3/uL (ref 0.7–4.0)
Monocytes Relative: 4 % (ref 3–12)
Neutro Abs: 3.8 10*3/uL (ref 1.7–7.7)
Neutrophils Relative %: 64 % (ref 43–77)

## 2010-12-30 LAB — BASIC METABOLIC PANEL
GFR calc Af Amer: >90 mL/min (ref >90)
GFR calc non Af Amer: >90 mL/min (ref >90)
Potassium: 3.7 mEq/L (ref 3.5–5.1)
Sodium: 137 mEq/L (ref 135–145)

## 2010-12-30 LAB — RAPID URINE DRUG SCREEN, HOSP PERFORMED: Benzodiazepines: NOT DETECTED

## 2010-12-30 LAB — URINALYSIS, ROUTINE W REFLEX MICROSCOPIC
Glucose, UA: NEGATIVE mg/dL
Protein, ur: NEGATIVE mg/dL
pH: 5.5 (ref 5.0–8.0)

## 2010-12-30 LAB — CBC
Hemoglobin: 14.8 g/dL (ref 12.0–15.0)
RBC: 4.86 MIL/uL (ref 3.87–5.11)

## 2010-12-30 LAB — PREGNANCY, URINE: Preg Test, Ur: NEGATIVE

## 2010-12-30 LAB — URINE MICROSCOPIC-ADD ON

## 2010-12-30 MED ORDER — IBUPROFEN 600 MG PO TABS
600.0000 mg | ORAL_TABLET | Freq: Three times a day (TID) | ORAL | Status: DC | PRN
  Administered 2010-12-30 – 2011-01-03 (×2): 600 mg via ORAL
  Filled 2010-12-30 (×3): qty 1

## 2010-12-30 MED ORDER — LORAZEPAM 1 MG PO TABS: 1.0000 mg | ORAL_TABLET | Freq: Three times a day (TID) | ORAL | Status: DC | PRN

## 2010-12-30 MED ORDER — ONDANSETRON HCL 4 MG PO TABS: 4.0000 mg | ORAL_TABLET | Freq: Three times a day (TID) | ORAL | Status: DC | PRN

## 2010-12-30 MED ORDER — ALUM & MAG HYDROXIDE-SIMETH 200-200-20 MG/5ML PO SUSP: 30.0000 mL | ORAL | Status: DC | PRN

## 2010-12-30 MED ORDER — ACETAMINOPHEN 325 MG PO TABS: 650.0000 mg | ORAL_TABLET | ORAL | Status: DC | PRN

## 2010-12-30 MED ORDER — PNEUMOCOCCAL VAC POLYVALENT 25 MCG/0.5ML IJ INJ
0.5000 mL | INJECTION | INTRAMUSCULAR | Status: AC
  Administered 2010-12-31: 0.5 mL via INTRAMUSCULAR

## 2010-12-30 MED ORDER — IBUPROFEN 200 MG PO TABS: 600.0000 mg | ORAL_TABLET | Freq: Three times a day (TID) | ORAL | Status: DC | PRN

## 2010-12-30 MED ORDER — ZOLPIDEM TARTRATE 5 MG PO TABS: 5.0000 mg | ORAL_TABLET | Freq: Every evening | ORAL | Status: DC | PRN

## 2010-12-30 MED ORDER — MAGNESIUM HYDROXIDE 400 MG/5ML PO SUSP: 30.0000 mL | Freq: Every day | ORAL | Status: DC | PRN

## 2010-12-30 MED ORDER — NICOTINE 21 MG/24HR TD PT24: 21.0000 mg | MEDICATED_PATCH | Freq: Every day | TRANSDERMAL | Status: DC

## 2010-12-30 MED ORDER — TRAZODONE HCL 50 MG PO TABS
50.0000 mg | ORAL_TABLET | Freq: Every evening | ORAL | Status: DC | PRN
  Administered 2010-12-31 – 2011-01-02 (×3): 50 mg via ORAL
  Filled 2010-12-30 (×3): qty 1

## 2010-12-30 MED ORDER — CHLORDIAZEPOXIDE HCL 25 MG PO CAPS
25.0000 mg | ORAL_CAPSULE | Freq: Four times a day (QID) | ORAL | Status: DC | PRN
  Administered 2010-12-30 – 2010-12-31 (×2): 25 mg via ORAL
  Filled 2010-12-30 (×3): qty 1

## 2010-12-30 MED ORDER — NICOTINE 21 MG/24HR TD PT24
21.0000 mg | MEDICATED_PATCH | Freq: Every day | TRANSDERMAL | Status: DC
  Administered 2010-12-30 – 2011-01-02 (×3): 21 mg via TRANSDERMAL
  Filled 2010-12-30 (×4): qty 1
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
## 2010-12-30 NOTE — ED Notes (Signed)
Breakfast tray served---pt. Eating from tray--police at bedside.
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
## 2010-12-30 NOTE — ED Notes (Signed)
Pt ambulated to the restroom and void, sample collected at that time. Pt also changed into blue scrubs.

## 2010-12-30 NOTE — ED Provider Notes (Signed)
History     CSN: [id number] Arrival date & time: 12/30/2010  2:26 AM   First MD Initiated Contact with Patient 12/30/10 0247      Chief Complaint  Patient presents with  . Extremity Laceration    (Consider location/radiation/quality/duration/timing/severity/associated sxs/prior treatment) The history is provided by the patient.    Pt presents to the ED by [employer] for cutting her own for arm multiples times and being suicidal. She states that she is suicidal and will kill herself if she goes home because she was listening to depressed music and is sad. Pt is currently intoxicated and is here voluntarily, she states that she needs help and that she drinks a lot. Pt denies any illicit drugs. PTs behavior is inappropriate. Unable to assess pt and do a proper HPI due to intoxication.  History reviewed. No pertinent past medical history.  Past Surgical History  Procedure Date  . Foot surgery     Family History  Problem Relation Age of Onset  . Diabetes Other     History  Substance Use Topics  . Smoking status: Current Everyday Smoker -- 0.5 packs/day    Types: Cigarettes  . Smokeless tobacco: Not on file  . Alcohol Use: Yes     Heavy    OB History    Grav Para Term Preterm Abortions TAB SAB Ect Mult Living                  Review of Systems  Unable to perform ROS   Allergies  Review of patient's allergies indicates no known allergies.  Home Medications  No current outpatient prescriptions on file.  BP 138/90  Pulse 125  Temp(Src) 98.8 F (37.1 C) (Oral)  Resp 18  SpO2 100%  LMP 12/12/2010  Physical Exam  Constitutional: She appears well-developed and well-nourished.  HENT:  Head: Normocephalic and atraumatic.  Eyes: Conjunctivae are normal. Pupils are equal, round, and reactive to light.  Neck: Trachea normal, normal range of motion and full passive range of motion without pain. Neck supple.  Cardiovascular: Normal rate, regular rhythm and normal pulses.     Pulmonary/Chest: Effort normal and breath sounds normal. Chest wall is not dull to percussion. She exhibits no tenderness, no crepitus, no edema, no deformity and no retraction.  Abdominal: Soft. Normal appearance and bowel sounds are normal.  Musculoskeletal: Normal range of motion.  Lymphadenopathy:       Head (right side): No submental, no submandibular, no tonsillar, no preauricular, no posterior auricular and no occipital adenopathy present.       Head (left side): No submental, no submandibular, no tonsillar, no preauricular, no posterior auricular and no occipital adenopathy present.    She has no cervical adenopathy.    She has no axillary adenopathy.  Neurological: She is alert. She has normal strength.  Skin: Skin is warm, dry and intact.     Psychiatric: Her affect is blunt and inappropriate. Her speech is slurred (pt is intoxicated on alcohol). Cognition and memory are normal. She expresses impulsivity and inappropriate judgment. She expresses homicidal and suicidal ideation. She expresses suicidal plans.    ED Course  Procedures (including critical care time)   Labs Reviewed  BASIC METABOLIC PANEL  CBC  DIFFERENTIAL  DRUGS OF ABUSE SCREEN W ALC, ROUTINE URINE  ETHANOL  URINALYSIS, ROUTINE W REFLEX MICROSCOPIC  PREGNANCY, URINE   No results found.   No diagnosis found.    MDM  Waiting to be assessed by ACT  Dorthula Matas, PA 12/30/10 [phone]

## 2010-12-30 NOTE — ED Notes (Signed)
[place] with Behavioral Health called stating pt. Has been accepted at that facility--will be going to room 300 bed 2--police officers here and will transport---waiting for a call back from there to give report so she can be transferred

## 2010-12-30 NOTE — ED Notes (Signed)
Requesting drink of water---water given--pt. Drinking and tolerating well.  No change in status
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
## 2010-12-30 NOTE — ED Notes (Signed)
Pt has multiple cuts noted to her left forearm she states she cut herself because she wanted to hurt herself  Denies SI

## 2010-12-30 NOTE — ED Notes (Signed)
Ate approx. 75% of breaklfast--now lying on carrier---more calm watching TV--Police remain at bedside.

## 2010-12-30 NOTE — ED Provider Notes (Signed)
Medical screening examination/treatment/procedure(s) were performed by non-physician practitioner and as supervising physician I was immediately available for consultation/collaboration.   Joya Gaskins, MD 12/30/10 [phone]

## 2010-12-30 NOTE — ED Notes (Signed)
Report given to Minerva Areola, RN @ Behavioral Health

## 2010-12-30 NOTE — ED Notes (Signed)
Taken to bathroom to void----handcuffs removed and police escorted to restroom--Ambulates with a steady gait.--Back to exam room after voiding and handcuffs are off---police remain on either side of pt.

## 2010-12-30 NOTE — BH Assessment (Signed)
Writer informed by Patsy Lager in the assessment office that pt has been accepted to [facility] by Dr.  Estill Dooms. Pt informed EDP-Anthony Freida Busman and pt's nurse of pt's disposition. EDP agrees with disposition and will discharge patient to [facility] for in-pt treatment. Writer has completed pt's support paperwork. Pt is under IVC and will be transferred to [facility] by [employer].   Melynda Ripple, MS (Assessment Counselor)

## 2010-12-30 NOTE — ED Notes (Signed)
Has rested on carrier for the past hour--Police remain at bedside with pt.

## 2010-12-31 DIAGNOSIS — F319 Bipolar disorder, unspecified: Secondary | ICD-10-CM

## 2010-12-31 MED ORDER — FLUOXETINE HCL 10 MG PO CAPS
30.0000 mg | ORAL_CAPSULE | Freq: Every day | ORAL | Status: DC
  Administered 2010-12-31: 30 mg via ORAL
  Administered 2011-01-01: 10 mg via ORAL
  Administered 2011-01-02 – 2011-01-03 (×2): 30 mg via ORAL
  Filled 2010-12-31 (×4): qty 3
  Filled 2010-12-31: qty 21
  Filled 2010-12-31: qty 3
  Filled 2010-12-31: qty 1
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
## 2010-12-31 NOTE — Progress Notes (Signed)
[facility] Group Notes:  (Counselor/Nursing/MHT/Case Management/Adjunct)  12/31/2010 3:48 PM  Type of Therapy:  Group Therapy aty 1:15pm  Participation Level:  None  Participation Quality:  Attentive and Resistant  Affect:  Flat  Cognitive:  Alert  Insight:  None  Engagement in Group:  None  Engagement in Therapy:  None  Modes of Intervention:  Activity, Socialization and Support  Summary of Progress/Problems:Group played 'Ungame' in afternoon as we needed something to lift mood after an unpleasant outburst by one pt. Seriah was given several opportunities to participate yet chose not to although she was attentive to others.    Clide Dales 12/31/2010, 3:48 PM

## 2010-12-31 NOTE — Progress Notes (Signed)
Patient ID: Nichole Lopez, female   DOB: 1985-06-10, 25 y.o.   MRN: [id number] Pt denies SI/HI/AVH and pain.  Her left finger's dressing was removed, wound shows no S/S of infection--steristrips in place.  Wound cleaned, antibiotic ointment, and non-stick dressing applied.  Pt did not attend groups this am, stayed in her bed--wanted to get some sleep.

## 2010-12-31 NOTE — Progress Notes (Signed)
Patient ID: Nichole Lopez, female   DOB: 02-18-1985, 25 y.o.   MRN: [id number] Pt. Eyes closed, resting, resp. Even no distress noted. Staff will continue to monitor q64min for safety.

## 2010-12-31 NOTE — Progress Notes (Signed)
[facility] Group Notes:  (Counselor/Nursing/MHT/Case Management/Adjunct)  12/31/2010 12:23 PM  Type of Therapy:  Group Processing at 11am  Participation Level:  Did Not Attend  Nichole Lopez 12/31/2010, 12:23 PM

## 2010-12-31 NOTE — Progress Notes (Signed)
[facility] Adult Inpatient Family/Significant Other Suicide Prevention Education  Suicide Prevention Education:  Education Completed, Trula Ore, , sister at [phone]  (name of family member/significant other) has been identified by the patient as the family member/significant other with whom the patient will be residing, and identified as the person(s) who will aid the patient in the event of a mental health crisis (suicidal ideations/suicide attempt).  With written consent from the patient, the family member/significant other has been provided the following suicide prevention education, prior to the and/or following the discharge of the patient.  The suicide prevention education provided includes the following:  Suicide risk factors  Suicide prevention and interventions  National Suicide Hotline telephone number  [facility] assessment telephone number  [facility] Emergency Assistance 911  [facility] and/or Residential Mobile Crisis Unit telephone number  Reminder to contact Pt's therapist at [facility]  Request made of family/significant other to:  Remove weapons (e.g., guns, rifles, knives), all items previously/currently identified as safety concern.    Remove drugs/medications (over-the-counter, prescriptions, illicit drugs), all items previously/currently identified as a safety concern.  The family member/significant other verbalizes understanding of the suicide prevention education information provided.  The family member/significant other agrees to remove the items of safety concern listed above. Mobile Crisis Services also explained to pt and card placed in her chart for pt at discharge.  Clide Dales 12/31/2010, 4:27 PM

## 2010-12-31 NOTE — Progress Notes (Signed)
Adult Psychosocial Assessment Update Interdisciplinary Team  Previous [facility] admissions/discharges:  Admissions Discharges  Date:04/06/10 Date:  Date:02/17/10 Date:  Date: Date:  Date: Date:  Date: Date:   Changes since the last Psychosocial Assessment (including adherence to outpatient mental health and/or substance abuse treatment, situational issues contributing to decompensation and/or relapse). Pt reports that she is seen by [facility] and has recently enrolled in [employer]  to work towards obtaining GED; pt reports anxiety increases when she leaves the house.  Pt also reports use of alcohol increasing yet unclear on current use. Pt also reports  Tendency to isolate in apartment and recently listening to sad music which increased   Her thoughts/feelings of hopelessness.     Discharge Plan 1. Will you be returning to the same living situation after discharge?   Yes: Yes; lives alone No:      If no, what is your plan?           2. Would you like a referral for services when you are discharged? Yes:     If yes, for what services?  No:              Summary and Recommendations (to be completed by the evaluator) Pt is 25 YO unemployed female admitted with diagnosis of Bipolar, Depressed. Pt cut  Self with mirror while intoxicated.Patient will benefit from crisis stabilization, medication evaluation, group therapy and psycho ed groups, in addition to case management for discharge planning.                      Signature:  Clide Dales, 12/31/2010 4:14 PM
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
## 2010-12-31 NOTE — Progress Notes (Signed)
Patient seen during d/c planning group.  She advised of admitted to the hospital after becoming drunk and cutting herself.  She currently dines SI/HI.  She rates depression at zero, anxiety at six and hopelessness/helplessness at zero.  She is not interested in residential treatment.  She advised of having a place to live at discharge and that she has a sister who is supportive.  She receives outpatient services through [employer] [facility].  Suicide Prevention education reviewed.

## 2011-01-01 NOTE — Progress Notes (Signed)
PatID: Nichole Lopez, female   DOB: April 22, 1985, 25 y.o.   MRN: [id number]  [facility] Group Notes:  (Counselor/Nursing/MHT/Case Management/Adjunct)  01/01/2011 1:15 PM  Type of Therapy:  Group Therapy, Dance/Movement Therapy   Participation Level:  Active  Participation Quality:  Inattentive  Affect:  Depressed  Cognitive:  Oriented  Insight:  None  Engagement in Group:  Limited  Engagement in Therapy:  Limited  Modes of Intervention:  Clarification, Problem-solving, Role-play, Socialization and Support  Summary of Progress/Problems:   Group focused on an time when they were successful in their lives. Group discussed ways to be successful with the 12 steps and AA and NA surrounded by others in recovery. 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 Pt made eye contact with peers and therapist but did not share personal experiences with the group.   Thomasena Edis, [employer]

## 2011-01-01 NOTE — Progress Notes (Signed)
Pt in room laying in bed, denies pain/complaints at this time.  Minimal contact, did not attend group this evening.  Support and encouragement offered, will continue to monitor.

## 2011-01-01 NOTE — Progress Notes (Signed)
Pt.  Reports day good, denies depression, but facial expression is sad. Pt. Reports she will continue services at [facility] (community support), pt. Reports sadness, mother died in 08-Jul-2002, more sad during holidays. Staff will continue to monitor q33min for safety, Pinkey finger left hand cleaned dressed.

## 2011-01-01 NOTE — Progress Notes (Signed)
[facility] MD Progress Note  01/01/2011 12:12 PM  Subjective: Patient seen and evaluated. Chart reviewed. Patient stated that she slept from 10 PM until 6 AM this morning. She states that she is feeling better today and denied any acute complications. She denied any side effects from the reinitiation of trazodone and fluoxetine. She contracted for safety and denied any acute safety concerns. She denied any current thoughts of self injurious behavior and felt stable on the unit.  Objective: Patient reports her mood to be "okay". Her affect was mood congruent and euthymic. she was a lot brighter today than yesterday upon admission. She denied any current thoughts of self injurious behavior, suicidal ideation or homicidal ideation. Her speech was normal rate, tone and volume. Her eye contact was good. There is no psychomotor agitation or retardation noted. She seemed to be in good spirits and was not having any acute issues this time. Insight is limited and judgment is poor.  Sleep:  Number of Hours: 6.5   Vital Signs:Blood pressure 129/88, pulse 68, temperature 98.9 F (37.2 C), temperature source Oral, resp. rate 15, height 6\' 9"  (2.057 m), weight 79.833 kg (176 lb), last menstrual period 12/12/2010.  Lab Results: No results found for this or any previous visit (from the past 48 hour(s)).  Physical Findings: CIWA:  CIWA-Ar Total: 0   Assessment and plan: At this time we will continue crisis stabilization with the continuation of fluoxetine and trazodone. There no acute safety concerns at this time.  We will followup after the weekend with her community support team and family collateral is pending. Patient agreeable to the plan. No side effects reported from her medications.  Discussed with team.  Lupe Carney 01/01/2011, 12:12 PM

## 2011-01-02 DIAGNOSIS — F1010 Alcohol abuse, uncomplicated: Secondary | ICD-10-CM

## 2011-01-02 MED ORDER — NICOTINE 21 MG/24HR TD PT24
21.0000 mg | MEDICATED_PATCH | Freq: Every day | TRANSDERMAL | Status: DC
  Administered 2011-01-03: 21 mg via TRANSDERMAL
  Filled 2011-01-02 (×2): qty 1

## 2011-01-02 NOTE — Progress Notes (Signed)
Pt is depressed and sad   She attends groups and participates minimally   She contracts for safety and has no self harm behaviors    Pt received bandages For her cut finger   She said she did it before coming to the hospital by breaking a window    Verbal support given  Medications administered and effectiveness monitored    Q 15 min checks   Pt safe at present

## 2011-01-02 NOTE — Progress Notes (Signed)
Positive for group, feels ready to go home.  Pt does still appear blunted and depressed but brightens on approach and with interaction.  Denies SI/HI/hallucinations, denies any thoughts of self harm.  Support and encouragement offered, will continue to monitor.

## 2011-01-02 NOTE — Progress Notes (Signed)
[facility] MD Progress Note   Nichole Lopez is requesting to go home today. She is going home to her own apartment. Her sister lives in [place] but she does not plan on staying with her. She recognizes that she didn't unwise thing with drinking which led her to cut herself. But she says she feels fine and wants to get back to school and to her own activities.  Objective:Marland Kitchen Pleasant and alert. She is cooperative and polite.  In full contact with reality.  Does not appear distracted, but rather somewhat bored and disinterested.  She has a sense of humor and can appreciate me teasing her. No dangerous ideas. Insight is adequate. Impulse control poor. Judgement poor.   No withdrawal symptoms.   Sleep:  Number of Hours: 6.5   Vital Signs:Blood pressure 112/79, pulse 86, temperature 98.1 F (36.7 C), temperature source Oral, resp. rate 65, height 6\' 9"  (2.057 m), weight 79.833 kg (176 lb), last menstrual period 12/12/2010.  Lab Results: No results found for this or any previous visit (from the past 48 hour(s)).   Sosie Gato A 01/02/2011, 3:35 PM

## 2011-01-02 NOTE — Progress Notes (Signed)
[facility] Group Notes:  (Counselor/Nursing/MHT/Case Management/Adjunct)  01/02/2011 1:15 PM  Type of Therapy:  Group Therapy, Dance/Movement Therapy   Participation Level:  Did Not Attend   Nichole Lopez  

## 2011-01-03 MED ORDER — FLUOXETINE HCL 10 MG PO CAPS: 30.0000 mg | ORAL_CAPSULE | Freq: Every day | ORAL | Status: DC

## 2011-01-03 MED ORDER — TRAZODONE HCL 50 MG PO TABS: 50.0000 mg | ORAL_TABLET | Freq: Every day | ORAL | Status: DC

## 2011-01-03 NOTE — Progress Notes (Signed)
[facility] Group Notes:  (Counselor/Nursing/MHT/Case Management/Adjunct)  01/03/2011 2:38 PM  Type of Therapy:  Group Processing at 11am  Participation Level:  None  Participation Quality:  Quiet  Affect:  Blunted  Cognitive:  Oriented  Insight:  Unknown as patient did not share  Engagement in Group:  None  Engagement in Therapy:  None  Modes of Intervention:  Problem-solving, Socialization and Support  Summary of Progress/Problems: Patient attended group session for only a portion of the group; she was quiet and did not participate verbally.   Clide Dales 01/03/2011, 2:38 PM

## 2011-01-03 NOTE — Progress Notes (Signed)
Patient ID: Nichole Lopez, female   DOB: [birth year], 25 y.o.   MRN: [id number] Nursing Pt. Is med compliant and attending Groups.She states she is ready for Discharge and has made plans for follow up care. She denies S/I,H/I and A/V hallucinations.Encouraged and supported.
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
## 2011-01-03 NOTE — Progress Notes (Signed)
Pt to d/c today.  Sent with scripts, meds.  Follow up with [facility] and [facility].  Met all inpt. Goals.

## 2011-01-03 NOTE — Progress Notes (Signed)
Patient ID: Nichole Lopez, female   DOB: 28-Aug-1985, 25 y.o.   MRN: [id number] Nursing Discharge Note: patient discharged home today.  She denies any SI/HI/AVH.  She was given samples of her medications along with her discharge instructions.  Her prescription for prozac was left on the unit and staff attempted to call her with no success.  She is supposed to follow up with [facility] tomorrow, but she stated that she had to attend school and was vague about showing up.  Patient did not have any questions regarding discharge.  She received all her belongings and left ambulatory.

## 2011-01-04 NOTE — Progress Notes (Signed)
Patient Discharge Instructions: No consents for [facility], or [facility], 01/04/2011, 2:11 PM

## 2011-02-25 ENCOUNTER — Emergency Department (HOSPITAL_COMMUNITY)
Admission: EM | Admit: 2011-02-25 | Discharge: 2011-02-26 | Disposition: A | Discharge status: Other Acute Inpatient Hospital | Payer: Medicare Other | Source: Home / Self Care | Attending: Emergency Medicine | Admitting: Emergency Medicine

## 2011-02-25 ENCOUNTER — Encounter (HOSPITAL_COMMUNITY): Payer: Self-pay | Admitting: *Deleted

## 2011-02-25 DIAGNOSIS — Z7289 Other problems related to lifestyle: Secondary | ICD-10-CM

## 2011-02-25 DIAGNOSIS — J45909 Unspecified asthma, uncomplicated: Secondary | ICD-10-CM | POA: Insufficient documentation

## 2011-02-25 DIAGNOSIS — F411 Generalized anxiety disorder: Secondary | ICD-10-CM | POA: Insufficient documentation

## 2011-02-25 DIAGNOSIS — F1010 Alcohol abuse, uncomplicated: Secondary | ICD-10-CM

## 2011-02-25 DIAGNOSIS — R45851 Suicidal ideations: Secondary | ICD-10-CM | POA: Insufficient documentation

## 2011-02-25 DIAGNOSIS — F329 Major depressive disorder, single episode, unspecified: Secondary | ICD-10-CM

## 2011-02-25 DIAGNOSIS — F4310 Post-traumatic stress disorder, unspecified: Secondary | ICD-10-CM | POA: Insufficient documentation

## 2011-02-25 DIAGNOSIS — Z79899 Other long term (current) drug therapy: Secondary | ICD-10-CM | POA: Insufficient documentation

## 2011-02-25 DIAGNOSIS — F17200 Nicotine dependence, unspecified, uncomplicated: Secondary | ICD-10-CM | POA: Insufficient documentation

## 2011-02-25 DIAGNOSIS — Z781 Physical restraint status: Secondary | ICD-10-CM | POA: Insufficient documentation

## 2011-02-25 LAB — POCT PREGNANCY, URINE: Preg Test, Ur: NEGATIVE

## 2011-02-25 LAB — CBC
MCHC: 34.9 g/dL (ref 30.0–36.0)
RDW: 11.9 % (ref 11.5–15.5)

## 2011-02-25 LAB — POCT I-STAT, CHEM 8
BUN: 5 mg/dL — ABNORMAL LOW (ref 6–23)
HCT: 42 % (ref 36.0–46.0)
Sodium: 143 mEq/L (ref 135–145)
TCO2: 22 mmol/L (ref 0–100)

## 2011-02-25 LAB — DIFFERENTIAL
Basophils Absolute: 0 10*3/uL (ref 0.0–0.1)
Basophils Relative: 1 % (ref 0–1)
Monocytes Absolute: 0.3 10*3/uL (ref 0.1–1.0)
Neutro Abs: 3.2 10*3/uL (ref 1.7–7.7)
Neutrophils Relative %: 58 % (ref 43–77)

## 2011-02-25 LAB — RAPID URINE DRUG SCREEN, HOSP PERFORMED
Barbiturates: NOT DETECTED
Cocaine: NOT DETECTED
Opiates: NOT DETECTED

## 2011-02-25 LAB — ACETAMINOPHEN LEVEL: Acetaminophen (Tylenol), Serum: <15 ug/mL (ref 10–30)

## 2011-02-25 MED ORDER — ZIPRASIDONE MESYLATE 20 MG IM SOLR: 10.0000 mg | Freq: Once | INTRAMUSCULAR | Status: DC

## 2011-02-25 MED ORDER — ONDANSETRON HCL 4 MG PO TABS: 4.0000 mg | ORAL_TABLET | Freq: Three times a day (TID) | ORAL | Status: DC | PRN

## 2011-02-25 MED ORDER — ALUM & MAG HYDROXIDE-SIMETH 200-200-20 MG/5ML PO SUSP: 30.0000 mL | ORAL | Status: DC | PRN

## 2011-02-25 MED ORDER — TETANUS-DIPHTHERIA TOXOIDS TD 5-2 LFU IM INJ
0.5000 mL | INJECTION | Freq: Once | INTRAMUSCULAR | Status: AC
  Administered 2011-02-25: 0.5 mL via INTRAMUSCULAR
  Filled 2011-02-25: qty 0.5

## 2011-02-25 MED ORDER — ZOLPIDEM TARTRATE 5 MG PO TABS: 5.0000 mg | ORAL_TABLET | Freq: Every evening | ORAL | Status: DC | PRN

## 2011-02-25 MED ORDER — NICOTINE 21 MG/24HR TD PT24
21.0000 mg | MEDICATED_PATCH | Freq: Every day | TRANSDERMAL | Status: DC
  Administered 2011-02-26: 21 mg via TRANSDERMAL
  Filled 2011-02-25: qty 1

## 2011-02-25 MED ORDER — ZIPRASIDONE MESYLATE 20 MG IM SOLR
20.0000 mg | Freq: Once | INTRAMUSCULAR | Status: AC
  Administered 2011-02-25: 20 mg via INTRAMUSCULAR
  Filled 2011-02-25: qty 20

## 2011-02-25 MED ORDER — ACETAMINOPHEN 325 MG PO TABS: 650.0000 mg | ORAL_TABLET | ORAL | Status: DC | PRN

## 2011-02-25 MED ORDER — IBUPROFEN 600 MG PO TABS: 600.0000 mg | ORAL_TABLET | Freq: Three times a day (TID) | ORAL | Status: DC | PRN

## 2011-02-25 MED ORDER — ZIPRASIDONE MESYLATE 20 MG IM SOLR
INTRAMUSCULAR | Status: AC
  Filled 2011-02-25: qty 20

## 2011-02-25 MED ORDER — ZIPRASIDONE MESYLATE 20 MG IM SOLR
10.0000 mg | Freq: Once | INTRAMUSCULAR | Status: AC
  Administered 2011-02-25: 10 mg via INTRAMUSCULAR

## 2011-02-25 MED ORDER — LORAZEPAM 1 MG PO TABS: 1.0000 mg | ORAL_TABLET | Freq: Three times a day (TID) | ORAL | Status: DC | PRN

## 2011-02-25 NOTE — ED Notes (Signed)
1 bag belongings and 1 large purse placed and locked in [address] in TCU area

## 2011-02-25 NOTE — ED Notes (Signed)
This RN asked the patient if she could be cooperative so we could remove the restraints. Patient responded by shaking her head "no" back and forth; no verbalizations at this time. Restraints and sitter remain in place.

## 2011-02-25 NOTE — ED Notes (Signed)
Attempted to assess pt. Pt moving around in bed, but would not open eyes. Did confirm address and who she was with  affirmative "mmm-humm". Pt would not respond to other questions, only readjusted self in bed several times. Will revisit with pt when more responsive.

## 2011-02-25 NOTE — ED Notes (Signed)
Pt released of all restraints. Pt ambulated to bathroom, now laying in bed with eyes closed. Pt made aware that if pt starts to act up or become violent restraints will be applied again. Pt reports being hungry, pt made aware that lunch tray was ordered and will be up soon. Will call and give report to pysch ed.

## 2011-02-25 NOTE — ED Notes (Addendum)
Night shift RN reported that In and out cath not performed or necessary because pt used the bathroom and provided a clean catch urine specimen.

## 2011-02-25 NOTE — ED Notes (Signed)
Pt resting in bed with eyes closed. Upper limb restraints applied. Radial pulses +2. 2 finger widths between restraint and wrists. Respirations even and unlabored, bilateral symmetrical rise and fall of chest. Skin warm and dry. In no acute distress.

## 2011-02-25 NOTE — ED Notes (Signed)
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

## 2011-02-25 NOTE — ED Notes (Signed)
Pt. Was offered food and drink but did not want any at this time. Pt. Was offered to use the restroom, but does not have to go at this time.

## 2011-02-25 NOTE — ED Notes (Signed)
Pt. Belongings were lock in locker 37.

## 2011-02-25 NOTE — ED Notes (Signed)
Upon observation noted R hand restraint off, went to room w/security and reapplied restraint, patient talking asking for cover, she is cold, blanket applied as well, does not need anything at this time

## 2011-02-25 NOTE — ED Notes (Signed)
[id number]<[id number]> Expected date:<BR> Expected time:<BR> Means of arrival:<BR> Comments:<BR> EMS/suicidal/ETOH

## 2011-02-25 NOTE — ED Notes (Signed)
Report received from [place], [place]

## 2011-02-25 NOTE — ED Notes (Signed)
Went by pt's room, pt still sleeping and not rousing. Will revisit when pt more responsive.

## 2011-02-25 NOTE — ED Notes (Signed)
Pt attempting to bite her wrist restraints off, was explained that if she cooperates that possibly wrist restraint could be removed in near future and that she will soon be given a lunch tray. Pt agreed.

## 2011-02-25 NOTE — ED Provider Notes (Signed)
History     CSN: [id number]  Arrival date & time 02/25/11  4098   First MD Initiated Contact with Patient 02/25/11 0430      Chief Complaint  Patient presents with  . Suicidal    (Consider location/radiation/quality/duration/timing/severity/associated sxs/prior treatment) HPI  Past Medical History  Diagnosis Date  . Asthma   . Mental disorder PTSD Depression Schizophrenic disorder,  anxiety  . Depression     Past Surgical History  Procedure Date  . Foot surgery     Family History  Problem Relation Age of Onset  . Diabetes Other   . Depression Maternal Uncle     History  Substance Use Topics  . Smoking status: Current Everyday Smoker -- 0.5 packs/day    Types: Cigarettes  . Smokeless tobacco: Not on file  . Alcohol Use: 13.2 oz/week    12 Cans of beer, 10 Shots of liquor per week     Heavy    OB History    Grav Para Term Preterm Abortions TAB SAB Ect Mult Living                  Review of Systems  Allergies  Review of patient's allergies indicates no known allergies.  Home Medications   Current Outpatient Rx  Name Route Sig Dispense Refill  . FLUOXETINE HCL 20 MG PO CAPS Oral Take 20 mg by mouth daily.    . TRAZODONE HCL 50 MG PO TABS Oral Take 50 mg by mouth at bedtime.        BP 99/67  Pulse 85  Temp(Src) 98.2 F (36.8 C) (Oral)  Resp 22  SpO2 99%  Physical Exam  ED Course  Procedures (including critical care time)  Labs Reviewed  ETHANOL - Abnormal; Notable for the following:    Alcohol, Ethyl (B) 200 (*)    All other components within normal limits  SALICYLATE LEVEL - Abnormal; Notable for the following:    Salicylate Lvl <2.0 (*)    All other components within normal limits  POCT I-STAT, CHEM 8 - Abnormal; Notable for the following:    BUN 5 (*)    All other components within normal limits  ETHANOL - Abnormal; Notable for the following:    Alcohol, Ethyl (B) 27 (*)    All other components within normal limits  CBC    DIFFERENTIAL  URINE RAPID DRUG SCREEN (HOSP PERFORMED)  ACETAMINOPHEN LEVEL  POCT PREGNANCY, URINE  I-STAT, CHEM 8  POCT PREGNANCY, URINE   No results found.   No diagnosis found.    MDM  Patient required restraints and Geodon IM for potential harmful behavior to self.  Recheck at 1545: Sleeping but easily aroused        Donnetta Hutching, MD 02/25/11 1555

## 2011-02-25 NOTE — ED Notes (Signed)
Seizure pads applied to pt bed per RN.,Nichole Lopez. Pt. Hitting head on bed rails.

## 2011-02-25 NOTE — ED Notes (Signed)
Plan of care for pt is that once pt wakes up, restraints will be taken off slowly and pt response and psychological status will be assessed. If pt remains calm and cooperative pt will be moved to psych ed.
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
## 2011-02-25 NOTE — ED Provider Notes (Addendum)
History     CSN: [id number]  Arrival date & time 02/25/11  6962   First MD Initiated Contact with Patient 02/25/11 0430      Chief Complaint  Patient presents with  . Suicidal    (Consider location/radiation/quality/duration/timing/severity/associated sxs/prior treatment) Patient is a 26 y.o. female presenting with mental health disorder and intoxication. The history is provided by the patient. No language interpreter was used.  Mental Health Problem The primary symptoms include bizarre behavior. The current episode started today. This is a recurrent problem.  The bizarre behavior started this week. She has abnormal sexual behavior, agression and agitated behavior.  Precipitated by: unknown. The degree of incapacity that she is experiencing as a consequence of her illness is severe. Additional symptoms of the illness include agitation and distractible. Additional symptoms of the illness do not include no headaches or no seizures. She has already injured self. She does not contemplate injuring another person. She has not already  injured another person. Risk factors that are present for mental illness include a history of mental illness and substance abuse.  Alcohol Intoxication This is a recurrent problem. Episode onset: unknown. The problem occurs constantly. The problem has not changed since onset.Pertinent negatives include no chest pain, no headaches and no shortness of breath. The symptoms are aggravated by nothing. The symptoms are relieved by nothing. She has tried nothing for the symptoms. The treatment provided no relief.    Past Medical History  Diagnosis Date  . Asthma   . Mental disorder PTSD Depression Schizophrenic disorder,  anxiety  . Depression     Past Surgical History  Procedure Date  . Foot surgery     Family History  Problem Relation Age of Onset  . Diabetes Other   . Depression Maternal Uncle     History  Substance Use Topics  . Smoking status:  Current Everyday Smoker -- 0.5 packs/day    Types: Cigarettes  . Smokeless tobacco: Not on file  . Alcohol Use: 13.2 oz/week    12 Cans of beer, 10 Shots of liquor per week     Heavy    OB History    Grav Para Term Preterm Abortions TAB SAB Ect Mult Living                  Review of Systems  Constitutional: Negative for fever.  HENT: Negative.   Eyes: Negative.   Respiratory: Negative for shortness of breath.   Cardiovascular: Negative.  Negative for chest pain.  Gastrointestinal: Negative.   Genitourinary: Negative.   Musculoskeletal: Negative.   Skin: Negative.   Neurological: Negative.  Negative for seizures and headaches.  Hematological: Negative.   Psychiatric/Behavioral: Positive for self-injury and agitation.    Allergies  Review of patient's allergies indicates no known allergies.  Home Medications   Current Outpatient Rx  Name Route Sig Dispense Refill  . FLUOXETINE HCL 10 MG PO CAPS Oral Take 3 capsules (30 mg total) by mouth daily. 90 capsule 0  . TRAZODONE HCL 50 MG PO TABS Oral Take 50 mg by mouth at bedtime.        BP 118/88  Pulse 111  Resp 18  SpO2 99%  Physical Exam  Constitutional: She appears well-developed and well-nourished. She is uncooperative.  HENT:  Head: Normocephalic and atraumatic.  Right Ear: No hemotympanum.  Left Ear: No hemotympanum.  Eyes: Conjunctivae and EOM are normal. Pupils are equal, round, and reactive to light.  Neck: Normal range of motion. Neck supple.  No tracheal deviation present.  Cardiovascular: Normal rate and regular rhythm.   Pulmonary/Chest: Effort normal and breath sounds normal. She has no wheezes.  Abdominal: Soft. Bowel sounds are normal. There is no tenderness. There is no rebound and no guarding.  Musculoskeletal: Normal range of motion.  Neurological: She is alert. She has normal reflexes.  Skin: Skin is warm.       Multiple superfical lacerations on the volar aspect of the left forearm    Psychiatric: Her speech is rapid and/or pressured. She is agitated and aggressive. She expresses impulsivity.       Hyper sexual comments  Chaperone Porfirio Mylar present during entirety of exam  ED Course  Procedures (including critical care time)   Labs Reviewed  CBC  DIFFERENTIAL  I-STAT, CHEM 8  POCT PREGNANCY, URINE  ETHANOL  URINE RAPID DRUG SCREEN (HOSP PERFORMED)  SALICYLATE LEVEL  ACETAMINOPHEN LEVEL   No results found.   No diagnosis found.    MDM  Trying to run and verbally assualtive to staff  Signed out to EDP   Case d/w Aurther Loft of ACT and patient will be added to list  Rolly Magri K Demani Weyrauch-Rasch, MD 02/25/11 [phone]

## 2011-02-25 NOTE — ED Notes (Signed)
Report given to pysch ed.

## 2011-02-25 NOTE — ED Notes (Signed)
This RN asked patient if she could be cooperative if we removed the restraints. Patient shook head "no" back and forth. No verbalizations at this time. Restraints and sitter to remain in place at this time.
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
## 2011-02-25 NOTE — ED Notes (Signed)
Restraints released, given drink, offered food, offered toileting, placed blanket over her, she went back to sleep, no complaints, one scratched area on left wrist bleeding, cleaned and bandaid applied
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
## 2011-02-26 ENCOUNTER — Encounter (HOSPITAL_COMMUNITY): Payer: Self-pay | Admitting: *Deleted

## 2011-02-26 ENCOUNTER — Inpatient Hospital Stay (HOSPITAL_COMMUNITY)
Admission: AD | Admit: 2011-02-26 | Discharge: 2011-03-03 | DRG: 897 | Disposition: A | Discharge status: Home / Self Care | Payer: Medicare Other | Source: Ambulatory Visit | Attending: Psychiatry | Admitting: Psychiatry

## 2011-02-26 DIAGNOSIS — F411 Generalized anxiety disorder: Secondary | ICD-10-CM

## 2011-02-26 DIAGNOSIS — Z79899 Other long term (current) drug therapy: Secondary | ICD-10-CM

## 2011-02-26 DIAGNOSIS — X838XXA Intentional self-harm by other specified means, initial encounter: Secondary | ICD-10-CM

## 2011-02-26 DIAGNOSIS — F1994 Other psychoactive substance use, unspecified with psychoactive substance-induced mood disorder: Secondary | ICD-10-CM

## 2011-02-26 DIAGNOSIS — Z8659 Personal history of other mental and behavioral disorders: Secondary | ICD-10-CM

## 2011-02-26 DIAGNOSIS — F17200 Nicotine dependence, unspecified, uncomplicated: Secondary | ICD-10-CM

## 2011-02-26 DIAGNOSIS — F3289 Other specified depressive episodes: Secondary | ICD-10-CM

## 2011-02-26 DIAGNOSIS — F4310 Post-traumatic stress disorder, unspecified: Secondary | ICD-10-CM | POA: Diagnosis present

## 2011-02-26 DIAGNOSIS — S51809A Unspecified open wound of unspecified forearm, initial encounter: Secondary | ICD-10-CM

## 2011-02-26 DIAGNOSIS — J45909 Unspecified asthma, uncomplicated: Secondary | ICD-10-CM

## 2011-02-26 DIAGNOSIS — Z6827 Body mass index (BMI) 27.0-27.9, adult: Secondary | ICD-10-CM

## 2011-02-26 DIAGNOSIS — F1010 Alcohol abuse, uncomplicated: Secondary | ICD-10-CM

## 2011-02-26 DIAGNOSIS — F329 Major depressive disorder, single episode, unspecified: Secondary | ICD-10-CM

## 2011-02-26 DIAGNOSIS — IMO0002 Reserved for concepts with insufficient information to code with codable children: Secondary | ICD-10-CM

## 2011-02-26 DIAGNOSIS — F509 Eating disorder, unspecified: Secondary | ICD-10-CM

## 2011-02-26 DIAGNOSIS — F603 Borderline personality disorder: Secondary | ICD-10-CM | POA: Diagnosis present

## 2011-02-26 DIAGNOSIS — F1999 Other psychoactive substance use, unspecified with unspecified psychoactive substance-induced disorder: Secondary | ICD-10-CM

## 2011-02-26 DIAGNOSIS — F1020 Alcohol dependence, uncomplicated: Principal | ICD-10-CM

## 2011-02-26 MED ORDER — VITAMIN B-1 100 MG PO TABS
100.0000 mg | ORAL_TABLET | Freq: Every day | ORAL | Status: DC
  Administered 2011-02-27 – 2011-03-03 (×5): 100 mg via ORAL
  Filled 2011-02-26 (×7): qty 1

## 2011-02-26 MED ORDER — FLUOXETINE HCL 20 MG PO CAPS
20.0000 mg | ORAL_CAPSULE | Freq: Every day | ORAL | Status: DC
  Administered 2011-02-26 – 2011-03-02 (×5): 20 mg via ORAL
  Filled 2011-02-26 (×7): qty 1

## 2011-02-26 MED ORDER — CHLORDIAZEPOXIDE HCL 25 MG PO CAPS
50.0000 mg | ORAL_CAPSULE | Freq: Once | ORAL | Status: AC
  Administered 2011-02-26: 50 mg via ORAL
  Filled 2011-02-26: qty 2

## 2011-02-26 MED ORDER — CHLORDIAZEPOXIDE HCL 25 MG PO CAPS: 25.0000 mg | ORAL_CAPSULE | Freq: Four times a day (QID) | ORAL | Status: DC | PRN

## 2011-02-26 MED ORDER — CHLORDIAZEPOXIDE HCL 25 MG PO CAPS
25.0000 mg | ORAL_CAPSULE | ORAL | Status: AC
  Administered 2011-02-28 (×2): 25 mg via ORAL
  Filled 2011-02-26: qty 1

## 2011-02-26 MED ORDER — MAGNESIUM HYDROXIDE 400 MG/5ML PO SUSP: 30.0000 mL | Freq: Every day | ORAL | Status: DC | PRN

## 2011-02-26 MED ORDER — TRAZODONE HCL 50 MG PO TABS: 50.0000 mg | ORAL_TABLET | Freq: Every day | ORAL | Status: DC

## 2011-02-26 MED ORDER — FLUOXETINE HCL 20 MG PO CAPS
20.0000 mg | ORAL_CAPSULE | Freq: Every day | ORAL | Status: DC
  Filled 2011-02-26 (×2): qty 1

## 2011-02-26 MED ORDER — ACETAMINOPHEN 325 MG PO TABS: 650.0000 mg | ORAL_TABLET | Freq: Four times a day (QID) | ORAL | Status: DC | PRN

## 2011-02-26 MED ORDER — ACETAMINOPHEN 325 MG PO TABS
650.0000 mg | ORAL_TABLET | Freq: Four times a day (QID) | ORAL | Status: DC | PRN
  Administered 2011-02-27: 650 mg via ORAL

## 2011-02-26 MED ORDER — THIAMINE HCL 100 MG/ML IJ SOLN: 100.0000 mg | Freq: Once | INTRAMUSCULAR | Status: DC

## 2011-02-26 MED ORDER — HYDROXYZINE HCL 25 MG PO TABS: 25.0000 mg | ORAL_TABLET | Freq: Four times a day (QID) | ORAL | Status: DC | PRN

## 2011-02-26 MED ORDER — ONDANSETRON 4 MG PO TBDP: 4.0000 mg | ORAL_TABLET | Freq: Four times a day (QID) | ORAL | Status: AC | PRN

## 2011-02-26 MED ORDER — CHLORDIAZEPOXIDE HCL 25 MG PO CAPS
25.0000 mg | ORAL_CAPSULE | Freq: Four times a day (QID) | ORAL | Status: AC
  Administered 2011-02-26 (×2): 25 mg via ORAL
  Filled 2011-02-26 (×2): qty 1

## 2011-02-26 MED ORDER — CHLORDIAZEPOXIDE HCL 25 MG PO CAPS: 25.0000 mg | ORAL_CAPSULE | Freq: Three times a day (TID) | ORAL | Status: AC | PRN

## 2011-02-26 MED ORDER — TRAZODONE HCL 50 MG PO TABS
50.0000 mg | ORAL_TABLET | Freq: Every day | ORAL | Status: DC
  Administered 2011-02-26 – 2011-03-01 (×3): 50 mg via ORAL
  Filled 2011-02-26 (×6): qty 1

## 2011-02-26 MED ORDER — LOPERAMIDE HCL 2 MG PO CAPS: 2.0000 mg | ORAL_CAPSULE | ORAL | Status: AC | PRN

## 2011-02-26 MED ORDER — ADULT MULTIVITAMIN W/MINERALS CH
1.0000 | ORAL_TABLET | Freq: Every day | ORAL | Status: DC
  Administered 2011-02-26 – 2011-03-03 (×6): 1 via ORAL
  Filled 2011-02-26 (×8): qty 1

## 2011-02-26 MED ORDER — CHLORDIAZEPOXIDE HCL 25 MG PO CAPS
25.0000 mg | ORAL_CAPSULE | Freq: Every day | ORAL | Status: AC
  Administered 2011-03-01: 25 mg via ORAL
  Filled 2011-02-26: qty 1

## 2011-02-26 MED ORDER — ALUM & MAG HYDROXIDE-SIMETH 200-200-20 MG/5ML PO SUSP: 30.0000 mL | ORAL | Status: DC | PRN

## 2011-02-26 MED ORDER — CHLORDIAZEPOXIDE HCL 25 MG PO CAPS
25.0000 mg | ORAL_CAPSULE | Freq: Three times a day (TID) | ORAL | Status: AC
  Administered 2011-02-27 (×3): 25 mg via ORAL
  Filled 2011-02-26 (×3): qty 1

## 2011-02-26 NOTE — ED Provider Notes (Signed)
Pt relates she was drinking last night and felt sad and she cut her left arm. States she was last admitted to [facility] in Feb for similar episode and felt better when discharged. States she has been depressed and admits she isn't taking her medications.   Pt has flat affect. She has multiple linear superficial fresh abrasions in chaotic placement on her left volar forearm and upper arm.   Devoria Albe, MD, FACEP   Ward Givens, MD 02/26/11 [phone]

## 2011-02-26 NOTE — ED Notes (Signed)
Pt accepted to [facility] by Jorje Guild, PA to [place], 304-1. Pt IVC Reita Cliche will do paperwork from the Assessment office at [facility] as well as notify Dr Lynelle Doctor.

## 2011-02-26 NOTE — Progress Notes (Signed)
Reita Cliche, ACT states patient has been accepted at [facility] by Dr Jorje Guild.   Devoria Albe, MD, Armando Gang

## 2011-02-26 NOTE — ED Notes (Signed)
[employer] here to serve IVC papers and transport pt to [facility].  Security will transport valubles from the safe to [facility].

## 2011-02-26 NOTE — Progress Notes (Signed)
Suicide Risk Assessment  Admission Assessment     Demographic factors:  Assessment Details Time of Assessment: Admission Information Obtained From: Patient Current Mental Status:  Current Mental Status: Self-harm behaviors Loss Factors:  Loss Factors: Legal issues Historical Factors:  Historical Factors: Family history of mental illness or substance abuse Risk Reduction Factors:  Risk Reduction Factors: Religious beliefs about death  CLINICAL FACTORS:   Severe Anxiety and/or Agitation Depression:   Comorbid alcohol abuse/dependence Impulsivity Alcohol/Substance Abuse/Dependencies Personality Disorders:   Cluster B More than one psychiatric diagnosis Previous Psychiatric Diagnoses and Treatments  COGNITIVE FEATURES THAT CONTRIBUTE TO RISK:  Polarized thinking    Diagnosis:  Axis I: Alcohol Dependence. Substance Induced Mood Disorder. Posttraumatic Stress Disorder. Axis II: Borderline Personality Disorder.   The patient was seen today and reports the following:   ADL's: Intact.  Sleep: The patient reports to sleeping reasonably well.  Appetite: The patient reports a good appetite today.   Mild>(1-10) >Severe (By patient report) Hopelessness (1-10): 0  Depression (1-10): 0  Anxiety (1-10): 0    Suicidal Ideation: The patient denies any suicidal ideations today.  Plan: No  Intent: No  Means: No   Homicidal Ideation: The patient adamantly denies any homicidal ideations today.  Plan: No  Intent: No.  Means: No   General Appearance /Behavior: Casual and cooperative and repeatedly rocking back and forth.  Eye Contact: Good.  Speech: Appropriate in rate and volume.  Motor Behavior: Appropriate.  Level of Consciousness: Alert and Oriented x 3.  Mental Status: Alert and Oriented x 3.  Mood: Mildly Depressed in appearance. Affect: Mildly Constricted.  Anxiety Level: Moderately Anxious.  Thought Process: WNL.  Thought Content: The patient denies any auditory or visual  hallucinations or delusional thinking. Perception:. WNL.  Judgment: Fair.  Insight: Fair.  Cognition: Orientated to time, place and person.   Time was spent today discussing with the patient the situation leading to her admission.  The patient states that she began drinking alcohol again almost immediately after her last discharge from [facility],  The patient reports to drinking at least 12 beers per day over the last several weeks.  She denies ever stating that she would kill herself by jumping off of a bridge.  She does have multiple superficial lacerations on her right arm stating she does this "to feel good."  Treatment Plan Summary:  1. Daily contact with patient to assess and evaluate symptoms and progress in treatment  2. Medication management  3. The patient will deny suicidal ideations or homicidal ideations for 48 hours prior to discharge and have a depression and anxiety rating of 3 or less. The patient will also deny any auditory or visual hallucinations or delusional thinking.  4. The patient will report no symptoms of withdrawal at time of discharge.  Plan:  1. Will restart Prozac at 20 mgs po q am for depression. 2. Will restart Trazodone 50 mgs po qhs for sleep.  3. Will continue to monitor. 4. Will allow the patient to sign for voluntary care. 5. Will place on a Librium Protocol and will add a 50 mg loading dosage.  SUICIDE RISK:   Mild:  Suicidal ideation of limited frequency, intensity, duration, and specificity.  There are no identifiable plans, no associated intent, mild dysphoria and related symptoms, good self-control (both objective and subjective assessment), few other risk factors, and identifiable protective factors, including available and accessible social support.  Jarae Panas 02/26/2011, 1:40 PM

## 2011-02-26 NOTE — Progress Notes (Signed)
Patient ID: Nichole Lopez, female   DOB: Nov 13, 1985, 26 y.o.   MRN: [id number]  Pt reported that she was depressed and that she has severe ADHD. Pt was very fidgeted on admission and had very poor focus, pt was also very silly and superficial. On the unit the patient continues to display the same behaviors. Pt continues to be very manic and continues to require redirection. Pt reported being negative SI/HI, no AH/VH noted.
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
## 2011-02-26 NOTE — Progress Notes (Signed)
Pt accepted to [facility] and will come by [employer] due to being IVC.  Pt will go to room/bed 304-1 per PA Hessie Diener to Dr. Koren Shiver.   Support paper work will be completed and will be done so at [facility] since pt is IVC.  Pt can arrive at [facility] at 10:15.

## 2011-02-26 NOTE — Tx Team (Signed)
Initial Interdisciplinary Treatment Plan  PATIENT STRENGTHS: (choose at least two) Active sense of humor  PATIENT STRESSORS: Substance abuse   PROBLEM LIST: Problem List/Patient Goals Date to be addressed Date deferred Reason deferred Estimated date of resolution  Substance Abuse  02/26/2011           Depression 02/26/2011                                          DISCHARGE CRITERIA:  Withdrawal symptoms are absent or subacute and managed without 24-hour nursing intervention  PRELIMINARY DISCHARGE PLAN: Return to previous living arrangement  PATIENT/FAMIILY INVOLVEMENT: This treatment plan has been presented to and reviewed with the patient, Nichole Lopez, and/or family member, .  The patient and family have been given the opportunity to ask questions and make suggestions.  Jacquelyne Balint Shanta 02/26/2011, 5:55 PM
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
## 2011-02-26 NOTE — Progress Notes (Signed)
Patient ID: Nichole Lopez, female   DOB: 27-Jul-1985, 26 y.o.   MRN: [id number]  Pt requested to sign a 72 hour request for discharge, it was signed at 1845 on 02/26/2011.

## 2011-02-26 NOTE — Progress Notes (Signed)
Patient ID: Marieclaire Bettenhausen, female   DOB: [birth year], 26 y.o.   MRN: [id number]   [facility] Group Notes:  (Counselor/Nursing/MHT/Case Management/Adjunct)  02/26/2011 1:15 PM  Type of Therapy:  Group Therapy, Dance/Movement Therapy   Participation Level:  Did Not Attend  Kym Groom

## 2011-02-26 NOTE — ED Notes (Signed)
2 pt belongings bags, 1 black pocket book sent w/  [employer]

## 2011-02-26 NOTE — Progress Notes (Signed)
Pt positive for group tonight, denies SI/HI/hallucinations.  Pt on phone much of evening.  Pt's brightens on approach, silly at times.  Pt interacting appropriately within milieu.  Support and encouragement offered, will continue to monitor.

## 2011-02-26 NOTE — ED Notes (Signed)
Pt information sent [facility], [facility], and [facility] for review.

## 2011-02-27 MED ORDER — NICOTINE POLACRILEX 2 MG MT GUM
2.0000 mg | CHEWING_GUM | OROMUCOSAL | Status: DC | PRN
  Administered 2011-02-27 – 2011-03-03 (×6): 2 mg via ORAL
  Filled 2011-02-27 (×2): qty 1

## 2011-02-27 MED ORDER — ZIPRASIDONE MESYLATE 20 MG IM SOLR
20.0000 mg | Freq: Once | INTRAMUSCULAR | Status: AC
  Administered 2011-02-27: 20 mg via INTRAMUSCULAR

## 2011-02-27 MED ORDER — ZIPRASIDONE MESYLATE 20 MG IM SOLR
INTRAMUSCULAR | Status: AC
  Administered 2011-02-27: 18:00:00
  Filled 2011-02-27: qty 20

## 2011-02-27 NOTE — Progress Notes (Signed)
Patient ID: Nichole Lopez, female   DOB: [birth year], 26 y.o.   MRN: [id number]   Pt has been very flat and depressed on the unit. Pt reported that she felt better than she did yesterday and that she was not experiencing any withdrawals. Pt reported that she is somewhat depressed, however she is hoping that she could be discharged before Tuesday. Pt reported being negative SI/HI, no AH/VH noted.

## 2011-02-27 NOTE — CIRT (Signed)
Face to face evaluation completed at 1830 and patient remains in open door seclusion. Evaluation reviewed with Dr. Harvie Heck Readling who had no further recommendation at this time.  Doristine Locks RN-BC A/C
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
## 2011-02-27 NOTE — Progress Notes (Signed)
Patient ID: Nichole Lopez, female   DOB: 09/21/1985, 26 y.o.   MRN: [id number]   [facility] Adult Inpatient Family/Significant Other Suicide Prevention Education  Suicide Prevention Education:  Patient Refusal for Family/Significant Other Suicide Prevention Education: The patient Nichole Lopez has refused to provide written consent for family/significant other to be provided Family/Significant Other Suicide Prevention Education during admission and/or prior to discharge.  Physician notified.  Pt. accepted information on suicide prevention, warning signs to look for with suicide and crisis line numbers to use. The pt. agreed to call crisis line numbers if having warning signs or having thoughts of suicide.    Rhunette Croft 02/27/2011, 2:49 PM

## 2011-02-27 NOTE — CIRT (Signed)
Pt was in the dining area eating dinner, she asked to go to the restroom where she locked herself in the bathroom. At [employer] security did a two person escort to the seclusion room. At 1757 patient is in open door seclusion. While in seclusion patient was very silly and was making vigor statements to staff. Pt was redirected several times regarding the vigor statement, she was able to respond to redirection. At 1800 Jorje Guild PA was notified, at [employer] PA gave orders for Geodon 20mg  IM. At 1825 patient was given Geodon 20mg  IM, she took medication without any problems. The shot was given in her right arm. At 1837 patient patient allowed staff to take her vital signs, her vitals were TEMP 97.9 HR 106 BP 129/89 RESP 18. At 1850 patient went to sleep, seclusion stopped at that time.

## 2011-02-27 NOTE — CIRT (Signed)
Post Seclusion/Restraint Episode Mini Treatment Team  Date of Seclusion or Restraint Episode:  02/27/2011 1755 Today's Date:  02/27/2011  List of Patient Triggers/Skill Deficits: Patient was in dining area eating dinner asked to go to the bathroom, she locked herself in the bathroom.   Review of Medications:  Current Facility-Administered Medications  Medication Dose Route Frequency Provider Last Rate Last Dose  . acetaminophen (TYLENOL) tablet 650 mg  650 mg Oral Q6H PRN Franchot Gallo, MD   650 mg at 02/27/11 1212  . alum & mag hydroxide-simeth (MAALOX/MYLANTA) 200-200-20 MG/5ML suspension 30 mL  30 mL Oral Q4H PRN Jorje Guild, PA      . chlordiazePOXIDE (LIBRIUM) capsule 25 mg  25 mg Oral QID Franchot Gallo, MD   25 mg at 02/26/11 2211   Followed by  . chlordiazePOXIDE (LIBRIUM) capsule 25 mg  25 mg Oral TID Franchot Gallo, MD   25 mg at 02/27/11 1728   Followed by  . chlordiazePOXIDE (LIBRIUM) capsule 25 mg  25 mg Oral BH-qamhs Randy Readling, MD       Followed by  . chlordiazePOXIDE (LIBRIUM) capsule 25 mg  25 mg Oral Daily Randy Readling, MD      . chlordiazePOXIDE (LIBRIUM) capsule 25 mg  25 mg Oral TID PRN Jorje Guild, PA      . FLUoxetine (PROZAC) capsule 20 mg  20 mg Oral Daily Franchot Gallo, MD   20 mg at 02/27/11 0927  . loperamide (IMODIUM) capsule 2-4 mg  2-4 mg Oral PRN Franchot Gallo, MD      . magnesium hydroxide (MILK OF MAGNESIA) suspension 30 mL  30 mL Oral Daily PRN Franchot Gallo, MD      . mulitivitamin with minerals tablet 1 tablet  1 tablet Oral Daily Franchot Gallo, MD   1 tablet at 02/27/11 1610  . nicotine polacrilex (NICORETTE) gum 2 mg  2 mg Oral PRN Mickie D. Adams, PA   2 mg at 02/27/11 1729  . ondansetron (ZOFRAN-ODT) disintegrating tablet 4 mg  4 mg Oral Q6H PRN Franchot Gallo, MD      . thiamine (B-1) injection 100 mg  100 mg Intramuscular Once Franchot Gallo, MD      . thiamine (VITAMIN B-1) tablet 100 mg  100 mg Oral Daily Franchot Gallo, MD   100 mg at  02/27/11 0927  . traZODone (DESYREL) tablet 50 mg  50 mg Oral QHS Franchot Gallo, MD   50 mg at 02/26/11 2211  . ziprasidone (GEODON) 20 MG injection           . ziprasidone (GEODON) injection 20 mg  20 mg Intramuscular Once Jorje Guild, PA   20 mg at 02/27/11 1833    Compliant with Medications:  Yes  Need for Medication Adjustment:  Yes  Plan to Prevent Future Episodes of Seclusion and Restraint: Patient verbalized that she would not attempt to escape again and that she work on programming in hopes that she could be released on Tuesday.    Staff Present: Maryjo Rochester RN, Gena Fray, Scientist, physiological, Security, Cherlyn Roberts RN, Will RN, Tori RN  Physician   Nurse Practitioner/PA   Pharmacist   Nurse   Nurse   Nurse   MHT/NT   Counselor/Case Manager   Department Leadership        Jacquelyne Balint Shanta 02/27/2011, 6:58 PM

## 2011-02-27 NOTE — Progress Notes (Signed)
Pt is currently in quiet room sleeping.  She had received 20 mg of Geodon earlier after CIRT.  Respirations even and unlabored, no distress noted.  Will continue to monitor.

## 2011-02-27 NOTE — Progress Notes (Signed)
Nichole Lopez is a 26 y.o. female [id number] [birth year]  02/26/2011 Principal Problem:  *Alcohol dependence, continuous Active Problems:  Substance-induced disorder  Post traumatic stress disorder (PTSD)  Borderline personality disorder   Mental Status:    Subjective/Objective:    Filed Vitals:   02/27/11 1201  BP: 106/72  Pulse: 71  Temp:   Resp:     Lab Results:   BMET    Component Value Date/Time   NA 143 02/25/2011 0549   K 3.7 02/25/2011 0549   CL 108 02/25/2011 0549   CO2 22 12/30/2010 0400   GLUCOSE 84 02/25/2011 0549   BUN 5* 02/25/2011 0549   CREATININE 1.10 02/25/2011 0549   CALCIUM 9.9 12/30/2010 0400   GFRNONAA >90 12/30/2010 0400   GFRAA >90 12/30/2010 0400    Medications:  Scheduled:     . chlordiazePOXIDE  25 mg Oral QID   Followed by  . chlordiazePOXIDE  25 mg Oral TID   Followed by  . chlordiazePOXIDE  25 mg Oral BH-qamhs   Followed by  . chlordiazePOXIDE  25 mg Oral Daily  . chlordiazePOXIDE  50 mg Oral Once  . FLUoxetine  20 mg Oral Daily  . mulitivitamin with minerals  1 tablet Oral Daily  . thiamine  100 mg Intramuscular Once  . thiamine  100 mg Oral Daily  . traZODone  50 mg Oral QHS  . DISCONTD: FLUoxetine  20 mg Oral Daily  . DISCONTD: traZODone  50 mg Oral QHS     PRN Meds acetaminophen, alum & mag hydroxide-simeth, chlordiazePOXIDE, loperamide, magnesium hydroxide, ondansetron, DISCONTD: acetaminophen, DISCONTD: alum & mag hydroxide-simeth, DISCONTD: chlordiazePOXIDE, DISCONTD: magnesium hydroxide   Ordean Fouts,MICKIE D. 02/27/2011    Nichole Lopez is a 26 y.o. female [id number] Aug 10, 1985  02/26/2011 Principal Problem:  *Alcohol dependence, continuous Active Problems:  Substance-induced disorder  Post traumatic stress disorder (PTSD)  Borderline personality disorder   Mental Status:  alert & oriented is not suicidal homicidal or psychotic. No evidence for withdrawal and she denies cravings. Does ask for a  nicotine patch 1pack lasts 3-4 days . Suggested she try the gum.   Subjective/Objective: Asks if she could have ADD as she is impulsive and puts herself in dangerous situations. Also wants a pill she can take every time she feels anxious. Wants discharge tomorrow as she next has class Tuesday and court Wednesday. Superficical self inflicted lacerations are closed no evidence for infection.       Filed Vitals:   02/27/11 1201  BP: 106/72  Pulse: 71  Temp:   Resp:     Lab Results:   BMET    Component Value Date/Time   NA 143 02/25/2011 0549   K 3.7 02/25/2011 0549   CL 108 02/25/2011 0549   CO2 22 12/30/2010 0400   GLUCOSE 84 02/25/2011 0549   BUN 5* 02/25/2011 0549   CREATININE 1.10 02/25/2011 0549   CALCIUM 9.9 12/30/2010 0400   GFRNONAA >90 12/30/2010 0400   GFRAA >90 12/30/2010 0400    Medications:  Scheduled:     . chlordiazePOXIDE  25 mg Oral QID   Followed by  . chlordiazePOXIDE  25 mg Oral TID   Followed by  . chlordiazePOXIDE  25 mg Oral BH-qamhs   Followed by  . chlordiazePOXIDE  25 mg Oral Daily  . chlordiazePOXIDE  50 mg Oral Once  . FLUoxetine  20 mg Oral Daily  . mulitivitamin with minerals  1 tablet Oral Daily  . thiamine  100 mg Intramuscular  Once  . thiamine  100 mg Oral Daily  . traZODone  50 mg Oral QHS  . DISCONTD: FLUoxetine  20 mg Oral Daily  . DISCONTD: traZODone  50 mg Oral QHS     PRN Meds acetaminophen, alum & mag hydroxide-simeth, chlordiazePOXIDE, loperamide, magnesium hydroxide, ondansetron, DISCONTD: acetaminophen, DISCONTD: alum & mag hydroxide-simeth, DISCONTD: chlordiazePOXIDE, DISCONTD: magnesium hydroxide  Plan: Verify her med history tomorrow.    Aidin Doane,MICKIE D. 02/27/2011

## 2011-02-28 MED ORDER — LORAZEPAM 1 MG PO TABS
2.0000 mg | ORAL_TABLET | Freq: Four times a day (QID) | ORAL | Status: DC | PRN
  Administered 2011-02-28: 2 mg via ORAL
  Filled 2011-02-28: qty 2

## 2011-02-28 MED ORDER — RISPERIDONE 0.5 MG PO TBDP
0.5000 mg | ORAL_TABLET | Freq: Once | ORAL | Status: AC
Start: 2011-02-28 — End: 2011-02-28
  Administered 2011-02-28: 0.5 mg via ORAL
  Filled 2011-02-28 (×2): qty 1
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
## 2011-02-28 NOTE — Progress Notes (Signed)
Patient ID: Nichole Lopez, female   DOB: Mar 11, 1985, 26 y.o.   MRN: [id number] Pt. Lying in bed talking asking for a shot that makes you feel drunk but don't make you act like you drinking, "something to stop the craving." "I want a beer so bad." Pt. Reports anxiety at "8" out of 10. Writer asked pt. What plans she has for the future. She reports she want to finish school and be a cop. Denies SHI. Staff will continue 1:1 for safety.

## 2011-02-28 NOTE — Progress Notes (Signed)
[facility] Group Notes:  (Counselor/Nursing/MHT/Case Management/Adjunct)  02/28/2011 4:03 PM   Type of Therapy:  Processing Group at 11:00 am  Participation Level:  Minimal   Participation Quality:  Resistant  Affect:  Labile  Cognitive:  Alert  Insight:  none  Engagement in Group:  none  Engagement in Therapy:  none  Modes of Intervention:  Exploration, Socialization and Support  Summary of Progress/Problems:  Pt was somewhat attentive and displayed reactions to group process, especially what may be termed inappropriate laughter. Pt described as labile and was not pushed to "conform" to group process.  [facility] Group Notes:  (Counselor/Nursing/MHT/Case Management/Adjunct)  02/28/2011 4:03 PM   Type of Therapy:  Counseling Group at 1:15 pm  Participation Level:  Did Not Attend  Ronda Fairly, LCSWA 02/28/2011 4:03 PM
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
## 2011-02-28 NOTE — Discharge Planning (Signed)
Returning patient is here as a result of "drinking and cutting on myself."  Said she does not feel like she needs to be here any longer.  Mentioned that she has been referred to mental health court which will help her avoid jail time, but she has to refrain from drinking and drug use.  Is considering going to rehab, but only if her court liaison French Ana recommends it.  I suggested we call French Ana together as it is probably easier to get into rehab from here than if she goes home.  Declined to call.  Says she sees and therapist at [facility] and also gets services from [facility].  Will contact them today. Per State Regulation 482.30 This chart was reviewed for medical necessity with respect to the patient's Admission/Duration of stay. Daryel Gerald, [place]  02/28/2011  Next Review Date:  03/03/11

## 2011-03-01 MED ORDER — QUETIAPINE FUMARATE 25 MG PO TABS
25.0000 mg | ORAL_TABLET | Freq: Two times a day (BID) | ORAL | Status: DC
  Administered 2011-03-01: 25 mg via ORAL
  Filled 2011-03-01 (×6): qty 1
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
## 2011-03-01 NOTE — Progress Notes (Signed)
[facility] Group Notes:  (Counselor/Nursing/MHT/Case Management/Adjunct)  03/01/2011 2:56 PM  Type of Therapy:  Group Therapy at 11:00AM  Participation Level:  Did Not Attend   Clide Dales 03/01/2011, 2:56 PM

## 2011-03-01 NOTE — Progress Notes (Signed)
Recreation Therapy Notes  03/01/2011         Time: 1415      Group Topic/Focus: The focus of this group is on discussing various styles of communication and communicating assertively using 'I' (feeling) statements.  Participation Level: Minimal  Participation Quality: Attentive and Sharing  Affect: Irritable  Cognitive: Oriented   Additional Comments: Patient present for the last half of group, able to identify examples of positive communication.   Nichole Lopez 03/01/2011 3:41 PM

## 2011-03-01 NOTE — Progress Notes (Signed)
(  1400) Pt stayed in group and participated appropriately with other patients. Pt smiled, laughed and seemed relaxed. Pt denies SI/HI, and states "I will behave if it gets me out of here."
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
## 2011-03-01 NOTE — Progress Notes (Signed)
(  1000) 1:1 OBS continues: Pt hopes she can go home on Thursday. MD and RN told pt what is expected in order for her to go home ie: no inappropriate behavior, no running off the unit and to go to as many groups as she can. Pt is quiet, and repeated back what was expected of her. Pt understands. Pt denies SI/HI today. Pt met with treatment team.

## 2011-03-01 NOTE — Progress Notes (Signed)
[facility] MD Progress Note  03/01/2011 2:11 PM  Diagnoses: Alcohol Dependence and Withdrawal; PTSD; Borderline Personality Disorder; r/o Eating Disorder NOS  S/O: Pt seen and evaluated in treatment team.  Reviewed short term and long term goals, medications, current treatment in the hospital and acute/chronic safety issues.  Pt denied any current thoughts of self harm, suicidal ideation or homicidal ideation.  Contracted for safety on the unit.  Is on 1:1 because she tried to elope x2 yesterday, flashed the staff and was banging on the window.  Discussed importance of making improved decisions today in order to progress towards discharge.    Sleep:  Number of Hours: 6.5   Vital Signs:Blood pressure 102/60, pulse 80, temperature 98 F (36.7 C), temperature source Oral, resp. rate 20, height 5\' 8"  (1.727 m), weight 80.74 kg (178 lb), last menstrual period 02/26/2011, SpO2 96.00%.  Lab Results: No results found for this or any previous visit (from the past 48 hour(s)).  Physical Findings: CIWA:  CIWA-Ar Total: 2   A/P: Cont 1:1 until further stability is noted.  Medication education completed.  Will initiate Seroquel 25mg  bid for improved mood stability, decreased agitation and anxiety.  Pros, cons, risks, potential side effects and benefits were discussed with pt.  Pt agreeable with the plan.  See orders.  Discussed with team.  Lupe Carney 03/01/2011, 2:11 PM

## 2011-03-01 NOTE — Treatment Plan (Signed)
Interdisciplinary Treatment Plan Update (Adult)  Date: 03/01/2011  Time Reviewed: 9:10 AM   Progress in Treatment: Attending groups: Yes Participating in groups: Yes Taking medication as prescribed: Yes Tolerating medication: Yes   Family/Significant othe contact made: None noted  Patient understands diagnosis:  No  Limited insight Discussing patient identified problems/goals with staff:  Yes  See below Medical problems stabilized or resolved:  Yes Denies suicidal/homicidal ideation: Yes Issues/concerns per patient self-inventory: None noted Other:  New problem(s) identified: N/A  Reason for Continuation of Hospitalization: Medication stabilization Withdrawal symptoms  Interventions implemented related to continuation of hospitalization: Encourage group attendance and participation,  Coordinate with outside providers Additional comments:On 1:1 for unsafe behavior [attempting to escape, flashing, climbing on furniture]  Estimated length of stay:2-3 days  Discharge Plan:unclear  New goal(s): N/A  Review of initial/current patient goals per problem list:   1.  Goal(s):Exhibit self control  Met:  No  Target date:1/23  As evidenced [id number] off of 1:1 staffing  2.  Goal (s):Coordinate services with providers [CST, court liaison, apartment manager]  Met:  No  Target date:1/24  As evidenced by:CM to call with patient  3.  Goal(s):Identify comprehensive sobriety plan  Met:  No  Target date:1/24  As evidenced [id number] of plan  4.  Goal(s):  Met:  Yes  Target date:  As evidenced by:  Attendees: Patient:  Nichole Lopez 03/01/2011 9:10 AM  Family:     Physician:  Lupe Carney 03/01/2011 9:10 AM   Nursing: Carolynn Comment   03/01/2011 9:10 AM   Case Manager:  Richelle Ito, LCSW 03/01/2011 9:10 AM   Counselor:  Ronda Fairly, LCSWA 03/01/2011 9:10 AM   Other: Shelda Jakes PA 03/01/2011 9:10 AM  Other:     Other:     Other:      Scribe  for Treatment Team:   Ida Rogue, 03/01/2011 9:10 AM

## 2011-03-02 MED ORDER — FLUOXETINE HCL 10 MG PO CAPS
30.0000 mg | ORAL_CAPSULE | Freq: Every day | ORAL | Status: DC
  Administered 2011-03-03: 30 mg via ORAL
  Filled 2011-03-02 (×3): qty 3

## 2011-03-02 MED ORDER — TRAZODONE HCL 50 MG PO TABS
50.0000 mg | ORAL_TABLET | Freq: Every day | ORAL | Status: DC
  Administered 2011-03-02: 50 mg via ORAL
  Filled 2011-03-02 (×3): qty 1

## 2011-03-02 NOTE — Progress Notes (Signed)
Patient ID: Nichole Lopez, female   DOB: Aug 31, 1985, 26 y.o.   MRN: [id number] Nursing   Pt. Is no longer on 1-1 observation.She is doing puzzles and entertaining herself to  Keep it together so she can go home tomorrow and go on to out patient treatment.Encouraged and supported.

## 2011-03-02 NOTE — Progress Notes (Signed)
Patient ID: Nichole Lopez, female   DOB: [birth year], 26 y.o.   MRN: [id number] Pt. Takes meds without incidence, denies SHI. Pt. Sad affect tonight. Staff will continue 1:1 for safety.

## 2011-03-02 NOTE — Progress Notes (Signed)
Phone conference was done with Pt and therapist where she is currently being seen at [facility] of the [place] to inquire about possible SA treatment options there. Pt opposed to groups and prefers to be seen individually for SA treatment. Waiting to hear back about possible options. Spoke with Danne Harbor at [facility] where Pt is receiving community support services and was informed that the agency will be closing as of tomorrow. Waiting to hear more information on the matter tomorrow.

## 2011-03-02 NOTE — Progress Notes (Addendum)
[facility] Group Notes:  (Counselor/Nursing/MHT/Case Management/Adjunct)  03/02/2011 1:04 PM   Type of Therapy:  Processing Group at 11:00 am  Participation Level:  Minimal  Participation Quality:  Attentive  Affect:  Depressed  Cognitive:  Oriented  Insight:  Unknown  Engagement in Group:  Minimal  Engagement in Therapy:  Unknown  Modes of Intervention:  Exploration, support  Summary of Progress/Problems:  Natalynn was attentive yet chose not to participate  [facility] Group Notes:  (Counselor/Nursing/MHT/Case Management/Adjunct)  03/02/2011 2:50 PM   Type of Therapy:  Counseling Group at 1:15 pm  Participation Level: Minimal  Participation Quality:  In attendance  Affect:  Distracted  Cognitive:  Oriented  Insight:  Minimal  Engagement in Group:  Minimal  Engagement in Therapy:  Unknown  Modes of Intervention:  Exploration and support  Summary of Progress/Problems:  Tiffany attended entire group session focused on emotional regulation. She shared about growing up in a home in which adults had alcohol and/or drug addiction; thus trust was an issue; otherwise patient appeared distracted by crossword puzzle book.  Ronda Fairly, LCSWA 03/02/2011 2:58 PM

## 2011-03-02 NOTE — Treatment Plan (Signed)
Interdisciplinary Treatment Plan Update (Adult)  Date: 03/02/2011  Time Reviewed: 10:56 AM   Progress in Treatment: Attending groups: Yes Participating in groups: Yes Taking medication as prescribed: Yes Tolerating medication: Yes   Family/Significant othe contact made:   Patient understands diagnosis:  Yes Discussing patient identified problems/goals with staff:  Yes Medical problems stabilized or resolved:  Yes Denies suicidal/homicidal ideation: Yes per self inventory Issues/concerns per patient self-inventory:  None noted Other:  New problem(s) identified: N/A  Reason for Continuation of Hospitalization: Medication stabilization  Interventions implemented related to continuation of hospitalization: D/C 1:1 today  Medication adjustment  Hold accountable for decisions, behavior  Med changes today  D/C Seroquel   Additional comments:  Estimated length of stay:Possible d/c tomorrow  Discharge Plan:Return home, follow up outpt  New goal(s): N/A  Review of initial/current patient goals per problem list:   1.  Goal(s):Exhibit self control  Met:  Yes  Target date:  As evidenced by:  2.  Goal (s):Coordinate services with providers  Met:  Yes  Target date:  As evidenced by:  3.  Goal(s):Identify comprehensive sobriety plan  Met:  Yes  Target date:  As evidenced by:Attend IOP, go for assessment at [facility] on scheduled date  4.  Goal(s):  Met:  Yes  Target date:  As evidenced by:  Attendees: Patient:  Nichole Lopez 03/02/2011 10:56 AM  Family:     Physician:  Lupe Carney 03/02/2011 10:56 AM   Nursing:  Milinda Cave  03/02/2011 10:56 AM   Case Manager:  Richelle Ito, LCSW 03/02/2011 10:56 AM   Counselor:  Ronda Fairly, LCSWA 03/02/2011 10:56 AM   Other: Shelda Jakes  03/02/2011 10:56 AM  Other:     Other:     Other:      Scribe for Treatment Team:   Ida Rogue, 03/02/2011 10:56 AM

## 2011-03-02 NOTE — Progress Notes (Signed)
Nichole Lopez  25 y.o.  [id number] 1985-05-29  03/02/2011   Diagnosis:  Alcohol dependency                      BPD   Subjective/ Objective:Vital Signs:Blood pressure 102/67, pulse 112, temperature 98 F (36.7 C), temperature source Oral, resp. rate 20, height 5\' 8"  (1.727 m), weight 178 lb (80.74 kg), last menstrual period 02/26/2011, SpO2 96.00%. The patient was seen and evaluated by the Treatment team consisting of Psychiatrist, PAC, RN, Case Manager, and Therapist for evaluation and treatment plan with goal of stabilization upon discharge. The patient's physical and mental health problems were identified and treated appropriately.      Multiple modalities of treatment were used including medication, individual and group therapies, unit programming, AA/NA, improved nutrition, physical activity, and family sessions as needed.     Assessment: Pt continues to demonstrate ambivalence regarding her treatment. She shows a great deal of immaturity and poor judgement.  She does agree to continue to cooperate regarding her inappropriate behavior so 1:1 will be discontinued.  Medications Scheduled: . FLUoxetine  30 mg Oral Daily  . mulitivitamin with minerals  1 tablet Oral Daily  . thiamine  100 mg Intramuscular Once  . thiamine  100 mg Oral Daily  . traZODone  50 mg Oral QHS  . DISCONTD: FLUoxetine  20 mg Oral Daily  . DISCONTD: QUEtiapine  25 mg Oral BID  . DISCONTD: traZODone  50 mg Oral QHS  PRN Meds acetaminophen, alum & mag hydroxide-simeth, LORazepam, magnesium hydroxide, nicotine polacrilex  Plan: 1:1 will be discontinued.  Meds will be changed to her last dose of fluoxetine and trazodone which she agrees to take and showed improvement on during her last inpatient stay. Continue current plan of care with no changes at this time.  Anticipated D/C is 24-48 hours.  Rona Ravens. [name] [facility] 03/02/2011 4:45 PM

## 2011-03-02 NOTE — Progress Notes (Signed)
Patient ID: Nichole Lopez, female   DOB: [birth year], 26 y.o.   MRN: [id number] Pt. Appropriate in room doing puzzles, denies SHI. Pt. Reports that she wants to go home. She is being cooperative. Pt. Reports depression as "1" out of 10. Pt. 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 Pt. Encourage pt. To set realistic goals, and take one step at a time, first step making a conscious effort to stop abusing alcohol. Pt. Reports she has a lot of social anxiety so that's why she drinks "being around people and stuff." Writer encouraged pt. To continue verbalizing feeling accept therapeutic help, continue meds and avoid negativity. Staff will continue to monitor q49min for safety.

## 2011-03-03 MED ORDER — FLUOXETINE HCL 10 MG PO CAPS: 30.0000 mg | ORAL_CAPSULE | Freq: Every day | ORAL | Status: DC

## 2011-03-03 NOTE — Progress Notes (Signed)
[facility] Case Management Discharge Plan:  Will you be returning to the same living situation after discharge: Yes,  home At discharge, do you have transportation home?:Yes,  CST worker Do you have the ability to pay for your medications:Yes,  MCD  Interagency Information:     Release of information consent forms completed and in the chart;  Patient's signature needed at discharge.  Patient to Follow up at:  Follow-up Information    Follow up with [facility].   Contact information:    [phone]  [place] [phone]       Follow up with [facility] of the [place].   Contact information:   [address]  [place] [phone]      Follow up with [facility] on 03/14/2011. (You have an assessment with Richelle Ito at [facility] for admission.  If accepted, you will go directly into the program)    Contact information:   [address] [phone]         Patient denies SI/HI:   Yes,  yes    Aeronautical engineer and Suicide Prevention discussed:  Yes,  yes  Barrier to discharge identified:No.  Summary and Recommendations:   Nichole Lopez 03/03/2011, 8:09 AM
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
## 2011-03-03 NOTE — Progress Notes (Signed)
Patient discharged to home today.  Reviewed all discharge instructions, medications, and follow up care.  Patient verbalized understanding of all.  All belongings from locker returned.  Patient denies suicidal ideation or thoughts of self harm.  She left the unit ambulatory with RN and was escorted to the front lobby.

## 2011-03-03 NOTE — Treatment Plan (Signed)
Interdisciplinary Treatment Plan Update (Adult)  Date: 03/03/2011  Time Reviewed: 8:02 AM   Progress in Treatment: Attending groups: Yes Participating in groups: Yes Taking medication as prescribed: Yes Tolerating medication: Yes   Family/Significant othe contact made:   Patient understands diagnosis:  Yes Discussing patient identified problems/goals with staff:  Yes Medical problems stabilized or resolved:  Yes Denies suicidal/homicidal ideation: Yes  Per self inventory and in tx team Issues/concerns per patient self-inventory:  None noted Other:  New problem(s) identified: N/A  Reason for Continuation of Hospitalization: Other; describe D/C today  Interventions implemented related to continuation of hospitalization:   Additional comments:  Estimated length of stay:D/C today  Discharge Plan:Return home,  Follow up Vesta Mixer, Family Services, Assessment at [facility] rehab on Feb 4  New goal(s): N/A  Review of initial/current patient goals per problem list:   1.  Goal(s):Identify comprehensive sobriety plan  Met:  No  Target date:  As evidenced by:Although Tiffany states she will go to [facility] for [employer] , we are recommending inpt rehab services  2.  Goal (s):Exhibit self control  Met:  Yes  Target date:  As evidenced by:  3.  Goal(s):Coordinate services with providers  Met:  Yes  Target date:  As evidenced by:  4.  Goal(s):  Met:  Yes  Target date:  As evidenced by:  Attendees: Patient:  Nichole Lopez 03/03/2011 8:02 AM  Family:     Physician:  Lupe Carney 03/03/2011 8:02 AM   Nursing: Rudean Hitt Dax   03/03/2011 8:02 AM   Case Manager:  Richelle Ito, LCSW 03/03/2011 8:02 AM   Counselor:  Ronda Fairly, LCSWA 03/03/2011 8:02 AM   Other: Shelda Jakes PA  03/03/2011 8:02 AM  Other:     Other:     Other:      Scribe for Treatment Team:   Ida Rogue, 03/03/2011 8:02 AM  [place]

## 2011-03-04 NOTE — Progress Notes (Signed)
Patient Discharge Instructions:  Admission Note Faxed,  03/04/2011 After Visit Summary Faxed,  03/04/2011 Faxed to the [facility] Care provider:  03/04/2011 D/C Summary faxed 03/04/2011 Facesheet faxed 03/04/2011   Faxed to [facility] - Richelle Ito @ [phone] And to [facility] @ [phone] And to [facility] counseling @ [phone]  Wandra Scot, 03/04/2011, 3:28 PM

## 2011-03-12 ENCOUNTER — Encounter (HOSPITAL_COMMUNITY): Payer: Self-pay | Admitting: *Deleted

## 2011-03-12 ENCOUNTER — Emergency Department (HOSPITAL_COMMUNITY)
Admission: EM | Admit: 2011-03-12 | Discharge: 2011-03-13 | Disposition: A | Discharge status: Other Acute Inpatient Hospital | Payer: Medicare Other | Attending: Emergency Medicine | Admitting: Emergency Medicine

## 2011-03-12 DIAGNOSIS — F3289 Other specified depressive episodes: Secondary | ICD-10-CM | POA: Insufficient documentation

## 2011-03-12 DIAGNOSIS — F1010 Alcohol abuse, uncomplicated: Secondary | ICD-10-CM | POA: Insufficient documentation

## 2011-03-12 DIAGNOSIS — X58XXXA Exposure to other specified factors, initial encounter: Secondary | ICD-10-CM | POA: Insufficient documentation

## 2011-03-12 DIAGNOSIS — IMO0002 Reserved for concepts with insufficient information to code with codable children: Secondary | ICD-10-CM | POA: Insufficient documentation

## 2011-03-12 DIAGNOSIS — F329 Major depressive disorder, single episode, unspecified: Secondary | ICD-10-CM | POA: Insufficient documentation

## 2011-03-12 DIAGNOSIS — F17200 Nicotine dependence, unspecified, uncomplicated: Secondary | ICD-10-CM | POA: Insufficient documentation

## 2011-03-12 DIAGNOSIS — J45909 Unspecified asthma, uncomplicated: Secondary | ICD-10-CM | POA: Insufficient documentation

## 2011-03-12 DIAGNOSIS — R45851 Suicidal ideations: Secondary | ICD-10-CM | POA: Insufficient documentation

## 2011-03-12 DIAGNOSIS — F10929 Alcohol use, unspecified with intoxication, unspecified: Secondary | ICD-10-CM

## 2011-03-12 DIAGNOSIS — Z79899 Other long term (current) drug therapy: Secondary | ICD-10-CM | POA: Insufficient documentation

## 2011-03-12 HISTORY — DX: Other problems related to lifestyle: Z72.89

## 2011-03-12 LAB — COMPREHENSIVE METABOLIC PANEL
AST: 17 U/L (ref 0–37)
CO2: 23 mEq/L (ref 19–32)
Calcium: 9.5 mg/dL (ref 8.4–10.5)
Creatinine, Ser: 0.69 mg/dL (ref 0.50–1.10)
GFR calc Af Amer: >90 mL/min (ref >90)
GFR calc non Af Amer: >90 mL/min (ref >90)
Sodium: 140 mEq/L (ref 135–145)
Total Protein: 8.1 g/dL (ref 6.0–8.3)

## 2011-03-12 LAB — CBC
MCH: 31 pg (ref 26.0–34.0)
MCHC: 35.3 g/dL (ref 30.0–36.0)
MCV: 87.9 fL (ref 78.0–100.0)
Platelets: 360 10*3/uL (ref 150–400)
RBC: 4.64 MIL/uL (ref 3.87–5.11)

## 2011-03-12 LAB — RAPID URINE DRUG SCREEN, HOSP PERFORMED
Benzodiazepines: NOT DETECTED
Cocaine: NOT DETECTED
Opiates: NOT DETECTED
Tetrahydrocannabinol: NOT DETECTED

## 2011-03-12 LAB — ETHANOL: Alcohol, Ethyl (B): <11 mg/dL (ref 0–11)

## 2011-03-12 MED ORDER — LORAZEPAM 2 MG/ML IJ SOLN: 2.0000 mg | Freq: Once | INTRAMUSCULAR | Status: DC

## 2011-03-12 MED ORDER — LORAZEPAM 2 MG/ML IJ SOLN
INTRAMUSCULAR | Status: AC
  Filled 2011-03-12: qty 1

## 2011-03-12 MED ORDER — DIPHENHYDRAMINE HCL 50 MG/ML IJ SOLN: 50.0000 mg | Freq: Once | INTRAMUSCULAR | Status: DC

## 2011-03-12 MED ORDER — LORAZEPAM 1 MG PO TABS
1.0000 mg | ORAL_TABLET | Freq: Three times a day (TID) | ORAL | Status: DC | PRN
  Administered 2011-03-12 – 2011-03-13 (×2): 1 mg via ORAL
  Filled 2011-03-12 (×2): qty 1

## 2011-03-12 MED ORDER — NICOTINE 21 MG/24HR TD PT24: 21.0000 mg | MEDICATED_PATCH | Freq: Every day | TRANSDERMAL | Status: DC

## 2011-03-12 MED ORDER — LORAZEPAM 2 MG/ML IJ SOLN: 1.0000 mg | Freq: Once | INTRAMUSCULAR | Status: DC

## 2011-03-12 MED ORDER — ZIPRASIDONE MESYLATE 20 MG IM SOLR
10.0000 mg | Freq: Once | INTRAMUSCULAR | Status: AC
  Administered 2011-03-12: 10 mg via INTRAMUSCULAR
  Filled 2011-03-12: qty 20

## 2011-03-12 MED ORDER — LORAZEPAM 2 MG/ML IJ SOLN
2.0000 mg | Freq: Once | INTRAMUSCULAR | Status: AC
  Administered 2011-03-12: 2 mg via INTRAMUSCULAR

## 2011-03-12 MED ORDER — ZIPRASIDONE MESYLATE 20 MG IM SOLR
20.0000 mg | Freq: Once | INTRAMUSCULAR | Status: AC
  Administered 2011-03-12: 20 mg via INTRAMUSCULAR
  Filled 2011-03-12: qty 20

## 2011-03-12 MED ORDER — DIPHENHYDRAMINE HCL 50 MG/ML IJ SOLN
50.0000 mg | Freq: Once | INTRAMUSCULAR | Status: AC
  Administered 2011-03-12: 50 mg via INTRAMUSCULAR

## 2011-03-12 MED ORDER — DIPHENHYDRAMINE HCL 50 MG/ML IJ SOLN
INTRAMUSCULAR | Status: AC
  Filled 2011-03-12: qty 1
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
## 2011-03-12 NOTE — ED Provider Notes (Signed)
Medical screening examination/treatment/procedure(s) were performed by non-physician practitioner and as supervising physician I was immediately available for consultation/collaboration.  Flint Melter, MD 03/12/11 [phone]

## 2011-03-12 NOTE — ED Notes (Signed)
Pt asleep at this time. Respirations even and unlabored. [employer] removed handcuffs from
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
## 2011-03-12 NOTE — ED Notes (Signed)
Attempted to remove pt's facial and tongue piercings. Pt refused and threatened to bite. Removal process stopped at this time.

## 2011-03-12 NOTE — ED Notes (Signed)
Pt presents w/ shallow cuts on bilateral forearms and ETOH intoxication. Pt is under IVC papers for SI

## 2011-03-12 NOTE — ED Provider Notes (Addendum)
Pt presented intoxicated. Cuts to forearm. ivc papers. Act eval pending. Pt currently resting comfortably. Vitals normal.   [name], MD 03/12/11 0804  Pt w period agitation/acting out c/w prior, verbally abusive w staff. Pt attempted to be calmed. Geodon im for symptom management.   [name], MD 03/13/11 [phone]

## 2011-03-12 NOTE — ED Notes (Signed)
Pt asked for a sheet then a pen and paper; pt proceeded to smile and say what is a person going to do with a thin sheet, pen and paper? She gave options with the letters a, b, c, with c being commit suicide then she started laughing. Pt advised that's an example of why we think we need to keep her here to keep her safe. At that time pt laughed more and said I'm just fucking with you I just need to go home and get something to drink and go to bed. Items were not given to pt, security, gpd remain outside her door. Safety maintained.

## 2011-03-12 NOTE — ED Notes (Signed)
Pt remains hostile, yelling, talking in a sexual way to staff.

## 2011-03-12 NOTE — ED Notes (Signed)
IVC paperwork completed. Awaiting notary to notarize.
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
## 2011-03-13 MED ORDER — ZIPRASIDONE MESYLATE 20 MG IM SOLR
10.0000 mg | Freq: Once | INTRAMUSCULAR | Status: AC
  Administered 2011-03-13: 10 mg via INTRAMUSCULAR
  Filled 2011-03-13: qty 20

## 2011-03-13 MED ORDER — LORAZEPAM 1 MG PO TABS
1.0000 mg | ORAL_TABLET | Freq: Once | ORAL | Status: DC
  Filled 2011-03-13: qty 1

## 2011-03-13 MED ORDER — ZIPRASIDONE MESYLATE 20 MG IM SOLR
20.0000 mg | Freq: Once | INTRAMUSCULAR | Status: AC
  Administered 2011-03-13: 20 mg via INTRAMUSCULAR
  Filled 2011-03-13: qty 20
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
## 2011-03-13 NOTE — BH Assessment (Signed)
Assessment Note  Spoke w/ Nichole Lopez at [facility]. Pt has been accepted by Dr. Robet Leu to [facility]. Pt to arrive after 8 am. # for RN to give report - [phone].  Nichole Lopez P 03/13/2011 1:24 AM

## 2011-03-13 NOTE — ED Notes (Signed)
Per Baird Lyons at [facility] the Sheriff's dept would handle this transport [place]. Called Sheriff's dept transportation [phone] and left message stating we have pt to transport.

## 2011-03-13 NOTE — ED Notes (Addendum)
Per report pt is accepted to the [facility] at [facility] so the Tele[facility] is no longer needed.

## 2011-03-13 NOTE — ED Notes (Signed)
Sheriff here to transport pt to [facility]. One bag of personal belongings being sent w/ Sheriff and paperwork as well.

## 2011-03-13 NOTE — ED Notes (Signed)
Pt laying down, per off duty officer pt laid down to be able to use the phone later.

## 2011-03-13 NOTE — ED Notes (Signed)
Sheriff's office called to say they will probably be here around 1200 or 1230 to transport pt.
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
## 2011-03-13 NOTE — ED Notes (Signed)
Left message again for transport.

## 2011-03-23 ENCOUNTER — Encounter (HOSPITAL_COMMUNITY): Payer: Self-pay | Admitting: Emergency Medicine

## 2011-03-23 ENCOUNTER — Emergency Department (HOSPITAL_COMMUNITY)
Admission: EM | Admit: 2011-03-23 | Discharge: 2011-03-23 | Disposition: A | Discharge status: Other Acute Inpatient Hospital | Payer: Medicare Other | Attending: Emergency Medicine | Admitting: Emergency Medicine

## 2011-03-23 ENCOUNTER — Other Ambulatory Visit: Payer: Self-pay

## 2011-03-23 ENCOUNTER — Inpatient Hospital Stay (HOSPITAL_COMMUNITY): Admission: AD | Admit: 2011-03-23 | Payer: Medicare Other | Source: Ambulatory Visit | Admitting: Psychiatry

## 2011-03-23 DIAGNOSIS — T50901A Poisoning by unspecified drugs, medicaments and biological substances, accidental (unintentional), initial encounter: Secondary | ICD-10-CM | POA: Insufficient documentation

## 2011-03-23 DIAGNOSIS — T50902A Poisoning by unspecified drugs, medicaments and biological substances, intentional self-harm, initial encounter: Secondary | ICD-10-CM | POA: Insufficient documentation

## 2011-03-23 DIAGNOSIS — F17200 Nicotine dependence, unspecified, uncomplicated: Secondary | ICD-10-CM | POA: Insufficient documentation

## 2011-03-23 DIAGNOSIS — R45851 Suicidal ideations: Secondary | ICD-10-CM

## 2011-03-23 DIAGNOSIS — F411 Generalized anxiety disorder: Secondary | ICD-10-CM | POA: Insufficient documentation

## 2011-03-23 LAB — COMPREHENSIVE METABOLIC PANEL
Alkaline Phosphatase: 42 U/L (ref 39–117)
BUN: 7 mg/dL (ref 6–23)
Chloride: 104 mEq/L (ref 96–112)
GFR calc Af Amer: >90 mL/min (ref >90)
Glucose, Bld: 92 mg/dL (ref 70–99)
Potassium: 3.5 mEq/L (ref 3.5–5.1)
Total Bilirubin: 0.2 mg/dL — ABNORMAL LOW (ref 0.3–1.2)
Total Protein: 7 g/dL (ref 6.0–8.3)

## 2011-03-23 LAB — URINE MICROSCOPIC-ADD ON

## 2011-03-23 LAB — BLOOD GAS, VENOUS
FIO2: 0.21 %
O2 Saturation: 49.2 %
Patient temperature: 98.6

## 2011-03-23 LAB — CBC
HCT: 37 % (ref 36.0–46.0)
Hemoglobin: 12.8 g/dL (ref 12.0–15.0)
MCHC: 34.6 g/dL (ref 30.0–36.0)

## 2011-03-23 LAB — URINALYSIS, ROUTINE W REFLEX MICROSCOPIC
Protein, ur: NEGATIVE mg/dL
Urobilinogen, UA: 0.2 mg/dL (ref 0.0–1.0)

## 2011-03-23 LAB — PREGNANCY, URINE: Preg Test, Ur: NEGATIVE

## 2011-03-23 LAB — SALICYLATE LEVEL: Salicylate Lvl: <2 mg/dL — ABNORMAL LOW (ref 2.8–20.0)

## 2011-03-23 LAB — RAPID URINE DRUG SCREEN, HOSP PERFORMED: Barbiturates: NOT DETECTED

## 2011-03-23 LAB — ETHANOL: Alcohol, Ethyl (B): 112 mg/dL — ABNORMAL HIGH (ref 0–11)

## 2011-03-23 MED ORDER — IBUPROFEN 600 MG PO TABS: 600.0000 mg | ORAL_TABLET | Freq: Three times a day (TID) | ORAL | Status: DC | PRN

## 2011-03-23 MED ORDER — LORAZEPAM 2 MG/ML IJ SOLN
1.0000 mg | Freq: Once | INTRAMUSCULAR | Status: AC
  Administered 2011-03-23: 1 mg via INTRAVENOUS
  Filled 2011-03-23: qty 1

## 2011-03-23 MED ORDER — LORAZEPAM 1 MG PO TABS: 1.0000 mg | ORAL_TABLET | Freq: Three times a day (TID) | ORAL | Status: DC | PRN

## 2011-03-23 MED ORDER — HALOPERIDOL LACTATE 5 MG/ML IJ SOLN
10.0000 mg | Freq: Once | INTRAMUSCULAR | Status: AC
  Administered 2011-03-23: 10 mg via INTRAMUSCULAR

## 2011-03-23 MED ORDER — HALOPERIDOL LACTATE 5 MG/ML IJ SOLN
INTRAMUSCULAR | Status: AC
  Administered 2011-03-23: 10 mg via INTRAMUSCULAR
  Filled 2011-03-23: qty 2

## 2011-03-23 NOTE — ED Notes (Signed)
Asleep at this time, lying on her right side.

## 2011-03-23 NOTE — ED Notes (Signed)
Security remains bedside, 2nd RN bedside, cont to monitor

## 2011-03-23 NOTE — ED Notes (Signed)
MD at bedside. 
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
## 2011-03-23 NOTE — ED Notes (Signed)
Two patient belonging bags locked in locker 34 

## 2011-03-23 NOTE — ED Notes (Signed)
Report given to Kathlene November, RN [facility]. Pt to be moved to [facility] 34. Pt refusing to take out piercing from lips. Charge Nurse made aware and will talk with pt.

## 2011-03-23 NOTE — ED Notes (Signed)
Poison Control contacted, suggest supportive care, EKG, MD made aware, will f/u as needed

## 2011-03-23 NOTE — ED Notes (Signed)
Pt alert, arrives via EMS, c/o overdose, onset this evening, pt states "i took 79 prozac", per EMS pt has 3 empty prozac bottles in room on arrival, pt resp even unlabored, skin pwd, IV est pta, [employer] bedside

## 2011-03-23 NOTE — ED Notes (Signed)
Pt states she is here because she overdosed on her prozac.  Says she has been depressed x 15 years.  Denies SI/HI.

## 2011-03-23 NOTE — ED Notes (Signed)
Locked pt's jewelry in locker #34

## 2011-03-23 NOTE — ED Notes (Signed)
This RN at door, pt attempting to remove IV, this RN instructs pt to stop, pt becoming verbally abusive, [facility] [employer] bedside, MD bedside, orders obtained, charge RN bedside

## 2011-03-23 NOTE — ED Notes (Signed)
Pt accepted to [facility] (Dr. Dan Humphreys to Dr. Catha Brow) Completed assessment and documentation for pt. Faxed to [facility]. TC to EDP to notify that the pt accepted and all things completed. Informed RN to contact [employer] for transport. TC from [facility] Assessment informing that Dr. Dan Humphreys had rescinded his acceptance. Informed [facility] that EDP and RN notified and unsure if [employer] had been contacted. Instructed them to contact TA, as this was a pt they were placing. Contacted EDP and RN to stop process. EDP requested Dr. Dan Humphreys give her a call. RTC from EDP (Dr. Hyman Hopes) that pt was declined due to behavioral issues from her last visit to [facility] in January. Contacted TA who returned to ED for placement. Per note from TA, pt accepted to [facility] by Dr. Earlene Plater. RN Report to call [phone]. [facility] to transport.

## 2011-03-23 NOTE — ED Notes (Signed)
Pt in view of this RN, gown wrap around wrist, this RN question pt, pt states " i am not doing nothing", superficail lacerations noted to right wrist, bleeding stopped, pt refuses to open hand, states " i dont have nothing" security bedside, small piece of glass found on pt possession, this RN noticed pt still wearing underwear, female staff bedside to assist with removal

## 2011-03-23 NOTE — ED Notes (Signed)
BJY:[id number]<FA> Expected date:03/23/11<BR> Expected time: 1:00 AM<BR> Means of arrival:Ambulance<BR> Comments:<BR> Overdose 80 prozacs

## 2011-03-23 NOTE — Discharge Instructions (Signed)
Depression, Adolescent and Adult Depression is a true and treatable medical condition. In general there are two kinds of depression:  Depression we all experience in some form. For example depression from the death of a loved one, financial distress or natural disasters will trigger or increase depression.   Clinical depression, on the other hand, appears without an apparent cause or reason. This depression is a disease. Depression may be caused by chemical imbalance in the body and brain or may come as a response to a physical illness. Alcohol and other drugs can cause depression.  DIAGNOSIS  The diagnosis of depression is usually based upon symptoms and medical history. TREATMENT  Treatments for depression fall into three categories. These are:  Drug therapy. There are many medicines that treat depression. Responses may vary and sometimes trial and error is necessary to determine the best medicines and dosage for a particular patient.   Psychotherapy, also called talking treatments, helps people resolve their problems by looking at them from a different point of view and by giving people insight into their own personal makeup. Traditional psychotherapy looks at a childhood source of a problem. Other psychotherapy will look at current conflicts and move toward solving those. If the cause of depression is drug use, counseling is available to help abstain. In time the depression will usually improve. If there were underlying causes for the chemical use, they can be addressed.   ECT (electroconvulsive therapy) or shock treatment is not as commonly used today. It is a very effective treatment for severe suicidal depression. During ECT electrical impulses are applied to the head. These impulses cause a generalized seizure. It can be effective but causes a loss of memory for recent events. Sometimes this loss of memory may include the last several months.  Treat all depression or suicide threats as  serious. Obtain professional help. Do not wait to see if serious depression will get better over time without help. Seek help for yourself or those around you. In the [place] the number to the National Suicide Help Lines With 24 Hour Help Are: 1-800-SUICIDE (680)487-8908 Document Released: 01/22/2000 Document Revised: 10/06/2010 Document Reviewed: 09/12/2007 [facility] Patient Information 2012 [place], [place].Overdose, Adult A person can overdose on alcohol, drugs or both by accident or on purpose. If it was on purpose, it is a serious matter. Professional help should be sought. If the overdose was an accident, certain steps should be taken to make sure that it never happens again. ACCIDENTAL OVERDOSE Overdosing on prescription medications can be a result of:  Not understanding the instructions.   Misreading the label.   Forgetting that you took a dose and then taking another by mistake. This situation happens a lot.  To make sure this does not happen again:  Clarify the correct dosage with your caregiver.   Place the correct dosage in a "pill-minder" container (labeled for each day and time of day).   Have someone dispense your medicine.  Please be sure to follow-up with your primary care doctor as directed. INTENTIONAL OVERDOSE If the overdose was on purpose, it is a serious situation. Taking more than the prescribed amount of medications (including taking someone else's prescription), abusing street drugs or drinking an amount of alcohol that requires medical treatment can show a variety of possible problems. These may indicate you:  Are depressed or suicidal.   Are abusing drugs, took too much or combined different drugs to experiment with the effects.   Mixed alcohol with drugs and did not realize  the danger of doing so (this is drug abuse).   Are suffering addiction to drugs and/or alcohol (also known as chemical dependency).   Binge drink.  If you have not been referred to a  mental health professional for help, it is important that you get help right away. Only a professional can determine which problems may exist and what the best course of treatment may be. It is your responsibility to follow-up with further evaluation or treatment as directed.  Alcohol is responsible for a large number of overdoses and unintended deaths among college-age young adults. Binge drinking is consuming 4-5 drinks in a short period of time. The amount of alcohol in standard servings of wine (5 oz.), beer (12 oz.) and distilled spirits (1.5 oz., 80 proof) is the same. Beer or wine can be just as dangerous to the binge drinker as "hard" liquor can be.  CONSEQUENCES OF BINGE DRINKING Alcohol poisoning is the most serious consequence of binge drinking. This is a severe and potentially fatal physical reaction to an alcohol overdose. When too much alcohol is consumed, the brain does not get enough oxygen. The lack of oxygen will eventually cause the brain to shut down the voluntary functions that regulate breathing and heart rate. Symptoms of alcohol poisoning include:  Vomiting.   Passing out (unconsciousness).   Cold, clammy, pale or bluish skin.   Slow or irregular breathing.  WHAT SHOULD I DO NEXT? If you have a history of drug abuse or suffer chemical dependency (alcoholism, drug addiction or both), you might consider the following:  Talk with a qualified substance abuse counselor and consider entering a treatment program.   Go to a detox facility if necessary.   If you were attending self-help group meetings, consider returning to them and go often.   Explore other resources located near you (see sources listed below).  If you are unsure if you have a substance abuse problem, ask yourself the following questions:  Have you been told by friends or family that drugs/alcohol has become a problem?   Do you get into fights when drinking or using drugs?   Do you have blackouts (not  remembering what you do while using)?   Do you lie about use or amounts of drugs or alcohol you consume?   Do you need chemicals to get you going?   Do you suffer in work or school performance because of drug or alcohol use?   Do you get sick from drug or alcohol use but continue to use anyway?   Do you need drugs or alcohol to relate to people or feel comfortable in social situations?   Do you use drugs or alcohol to forget problems?  If you answered "Yes" to any of the above questions, it means you show signs of chemical dependency and a professional evaluation is suggested. The longer the use of drugs and alcohol continues, the problems will become greater. SEEK IMMEDIATE MEDICAL CARE IF:   You feel like you might repeat your problematic behavior.   You need someone to talk to and feel that it should not wait.   You feel you are a danger to yourself or someone else.   You feel like you are having a new reaction to medications you are taking, or you are getting worse after leaving a care center.   You have an overwhelming urge to drink or use drugs.  Addiction cannot be cured, but it can be treated successfully. Treatment centers are listed in  the yellow pages under: Cocaine, Narcotics, and Alcoholics Anonymous. Most hospitals and clinics can refer you to a specialized care center. The [place] government maintains a toll-free number for obtaining treatment referrals: 517-204-4928 or (717) 255-5010 (TDD) and maintains a website: http://findtreatment.RockToxic.pl. Other websites for additional information are: www.mentalhealth.RockToxic.pl. and GreatestFeeling.tn. In [place] treatment resources are listed in each [place] with listings available under [employer] or similar titles. Document Released: 01/27/2003 Document Revised: 10/06/2010 Document Reviewed: 12/19/2007 [facility] Patient Information 2012 [place], [place].Overdose, Adult A person can overdose on alcohol, drugs or both  by accident or on purpose. If it was on purpose, it is a serious matter. Professional help should be sought. If the overdose was an accident, certain steps should be taken to make sure that it never happens again. ACCIDENTAL OVERDOSE Overdosing on prescription medications can be a result of:  Not understanding the instructions.   Misreading the label.   Forgetting that you took a dose and then taking another by mistake. This situation happens a lot.  To make sure this does not happen again:  Clarify the correct dosage with your caregiver.   Place the correct dosage in a "pill-minder" container (labeled for each day and time of day).   Have someone dispense your medicine.  Please be sure to follow-up with your primary care doctor as directed. INTENTIONAL OVERDOSE If the overdose was on purpose, it is a serious situation. Taking more than the prescribed amount of medications (including taking someone else's prescription), abusing street drugs or drinking an amount of alcohol that requires medical treatment can show a variety of possible problems. These may indicate you:  Are depressed or suicidal.   Are abusing drugs, took too much or combined different drugs to experiment with the effects.   Mixed alcohol with drugs and did not realize the danger of doing so (this is drug abuse).   Are suffering addiction to drugs and/or alcohol (also known as chemical dependency).   Binge drink.  If you have not been referred to a mental health professional for help, it is important that you get help right away. Only a professional can determine which problems may exist and what the best course of treatment may be. It is your responsibility to follow-up with further evaluation or treatment as directed.  Alcohol is responsible for a large number of overdoses and unintended deaths among college-age young adults. Binge drinking is consuming 4-5 drinks in a short period of time. The amount of alcohol in  standard servings of wine (5 oz.), beer (12 oz.) and distilled spirits (1.5 oz., 80 proof) is the same. Beer or wine can be just as dangerous to the binge drinker as "hard" liquor can be.  CONSEQUENCES OF BINGE DRINKING Alcohol poisoning is the most serious consequence of binge drinking. This is a severe and potentially fatal physical reaction to an alcohol overdose. When too much alcohol is consumed, the brain does not get enough oxygen. The lack of oxygen will eventually cause the brain to shut down the voluntary functions that regulate breathing and heart rate. Symptoms of alcohol poisoning include:  Vomiting.   Passing out (unconsciousness).   Cold, clammy, pale or bluish skin.   Slow or irregular breathing.  WHAT SHOULD I DO NEXT? If you have a history of drug abuse or suffer chemical dependency (alcoholism, drug addiction or both), you might consider the following:  Talk with a qualified substance abuse counselor and consider entering a treatment program.   Go to a  detox facility if necessary.   If you were attending self-help group meetings, consider returning to them and go often.   Explore other resources located near you (see sources listed below).  If you are unsure if you have a substance abuse problem, ask yourself the following questions:  Have you been told by friends or family that drugs/alcohol has become a problem?   Do you get into fights when drinking or using drugs?   Do you have blackouts (not remembering what you do while using)?   Do you lie about use or amounts of drugs or alcohol you consume?   Do you need chemicals to get you going?   Do you suffer in work or school performance because of drug or alcohol use?   Do you get sick from drug or alcohol use but continue to use anyway?   Do you need drugs or alcohol to relate to people or feel comfortable in social situations?   Do you use drugs or alcohol to forget problems?  If you answered "Yes" to any  of the above questions, it means you show signs of chemical dependency and a professional evaluation is suggested. The longer the use of drugs and alcohol continues, the problems will become greater. SEEK IMMEDIATE MEDICAL CARE IF:   You feel like you might repeat your problematic behavior.   You need someone to talk to and feel that it should not wait.   You feel you are a danger to yourself or someone else.   You feel like you are having a new reaction to medications you are taking, or you are getting worse after leaving a care center.   You have an overwhelming urge to drink or use drugs.  Addiction cannot be cured, but it can be treated successfully. Treatment centers are listed in the yellow pages under: Cocaine, Narcotics, and Alcoholics Anonymous. Most hospitals and clinics can refer you to a specialized care center. The [place] government maintains a toll-free number for obtaining treatment referrals: (214) 766-0739 or (909)053-9968 (TDD) and maintains a website: http://findtreatment.RockToxic.pl. Other websites for additional information are: www.mentalhealth.RockToxic.pl. and GreatestFeeling.tn. In [place] treatment resources are listed in each [place] with listings available under [employer] for [employer] or similar titles. Document Released: 01/27/2003 Document Revised: 10/06/2010 Document Reviewed: 12/19/2007 [facility] Patient Information 2012 [place], [place].

## 2011-03-23 NOTE — ED Provider Notes (Signed)
History     CSN: [id number]  Arrival date & time 03/23/11  0122   First MD Initiated Contact with Patient 03/23/11 0129      Chief Complaint  Patient presents with  . Drug Overdose    (Consider location/radiation/quality/duration/timing/severity/associated sxs/prior treatment) HPI Comments: Patient presents via EMS after overdose on Prozac. She states that she took 80 pills some of which are 10 mg a monitor 20 mg about an hour ago. She denies any nausea, vomiting, abdominal pain. She denies alcohol use as well. She admits this was a suicide attempt. She has a history of schizophrenia and self cutting. She denies any homicidal ideation or hallucinations.  The history is provided by the patient and the EMS personnel.    Past Medical History  Diagnosis Date  . Asthma   . Mental disorder PTSD Depression Schizophrenic disorder,  anxiety  . Depression   . Deliberate self-cutting     Past Surgical History  Procedure Date  . Foot surgery     Family History  Problem Relation Age of Onset  . Diabetes Other   . Depression Maternal Uncle     History  Substance Use Topics  . Smoking status: Current Everyday Smoker -- 0.5 packs/day    Types: Cigarettes  . Smokeless tobacco: Not on file  . Alcohol Use: 13.2 oz/week    12 Cans of beer, 10 Shots of liquor per week     Heavy    OB History    Grav Para Term Preterm Abortions TAB SAB Ect Mult Living                  Review of Systems  Constitutional: Negative for activity change and appetite change.  HENT: Negative for congestion and rhinorrhea.   Eyes: Negative for visual disturbance.  Respiratory: Negative for cough, chest tightness and shortness of breath.   Cardiovascular: Negative for chest pain.  Gastrointestinal: Negative for nausea, vomiting and abdominal pain.  Genitourinary: Negative for dysuria and hematuria.  Musculoskeletal: Negative for back pain.  Neurological: Negative for dizziness, weakness and headaches.   Psychiatric/Behavioral: Positive for suicidal ideas, sleep disturbance and self-injury. The patient is nervous/anxious.     Allergies  Review of patient's allergies indicates no known allergies.  Home Medications   Current Outpatient Rx  Name Route Sig Dispense Refill  . FLUOXETINE HCL 10 MG PO CAPS Oral Take 3 capsules (30 mg total) by mouth daily. 90 capsule 0  . TRAZODONE HCL 50 MG PO TABS Oral Take 50 mg by mouth at bedtime.        BP 104/71  Pulse 62  Temp(Src) 97.3 F (36.3 C) (Oral)  Resp 18  Wt 178 lb (80.74 kg)  SpO2 98%  LMP 02/26/2011  Physical Exam  Constitutional: She is oriented to person, place, and time. She appears well-developed and well-nourished. No distress.  HENT:  Head: Normocephalic and atraumatic.  Mouth/Throat: Oropharynx is clear and moist. No oropharyngeal exudate.  Eyes: Conjunctivae are normal. Pupils are equal, round, and reactive to light.  Neck: Normal range of motion. Neck supple.  Cardiovascular: Normal rate, regular rhythm and normal heart sounds.   No murmur heard. Pulmonary/Chest: Effort normal. No respiratory distress.  Abdominal: Soft. There is no tenderness. There is no rebound and no guarding.  Musculoskeletal: Normal range of motion. She exhibits no edema and no tenderness.  Neurological: She is alert and oriented to person, place, and time. No cranial nerve deficit.  Skin: Skin is warm.  ED Course  Procedures (including critical care time)  Labs Reviewed  COMPREHENSIVE METABOLIC PANEL - Abnormal; Notable for the following:    Total Bilirubin 0.2 (*)    All other components within normal limits  ETHANOL - Abnormal; Notable for the following:    Alcohol, Ethyl (B) 112 (*)    All other components within normal limits  SALICYLATE LEVEL - Abnormal; Notable for the following:    Salicylate Lvl <2.0 (*)    All other components within normal limits  URINALYSIS, ROUTINE W REFLEX MICROSCOPIC - Abnormal; Notable for the  following:    Leukocytes, UA SMALL (*)    All other components within normal limits  BLOOD GAS, VENOUS - Abnormal; Notable for the following:    pH, Ven 7.422 (*)    pCO2, Ven 34.4 (*)    pO2, Ven 24.7 (*)    All other components within normal limits  CBC  ACETAMINOPHEN LEVEL  URINE RAPID DRUG SCREEN (HOSP PERFORMED)  PREGNANCY, URINE  LACTIC ACID, PLASMA  URINE MICROSCOPIC-ADD ON   No results found.   1. Overdose   2. Suicidal ideation       MDM  Suicide attempt with deliberate Prozac overdose. Patient is in no distress. Ingestion was about an hour ago but this as this is a nonlethal ingestion we'll not gastric lavage or give charcoal.  D/w poison control by nursing. Supportive care recommended. No evidence of coingestions.   No QT prolongation or seizure activity. Medically clear for psychiatry.    Date: 03/23/2011  Rate: 70  Rhythm: normal sinus rhythm  QRS Axis: left  Intervals: normal  ST/T Wave abnormalities: nonspecific ST/T changes  Conduction Disutrbances:none  Narrative Interpretation:   Old EKG Reviewed: unchanged       Glynn Octave, MD 03/23/11 1554

## 2011-03-23 NOTE — ED Notes (Signed)
MD made aware of B/P, pt sleeping soundly, cont to monitor

## 2011-03-23 NOTE — ED Provider Notes (Addendum)
Patient was seen in the emergency department by mobile crisis. IVC was completed by myself. Patient continues to await placement.  Cyndra Numbers, MD 03/23/11 1209  2:26 PM Patient accepted to behavioral health.  Patient remains in stable condition.  Cyndra Numbers, MD 03/23/11 1427  3:05 PM 30 minutes following Dr. Ceasar Mons acceptance of this patient Dr. Dan Humphreys subsequently refused the patient for admission to behavioral health. He states that the patient has been here numerous times and is "to behavioral forearm facility". He recommended that the patient may be more appropriate for [facility]. [facility] has been called to notify them that they will have to return if patient has now been turned down by behavioral health. We will pursue placement with [facility].  Cyndra Numbers, MD 03/23/11 1507  5:15 PM Patient has now been accepted back to talk. She was transferred in good condition.  Cyndra Numbers, MD 03/23/11 [phone]

## 2011-03-23 NOTE — ED Notes (Signed)
Spoke with Consulting civil engineer, pt to remain on unit, will re assess ability to be moved to Psych ED after AM rounding

## 2011-03-23 NOTE — ED Provider Notes (Signed)
Filed Vitals:   03/23/11 0715  BP: 101/62  Pulse: 71  Temp:   Resp: 18   Patient remains stable. Overnight she was hypotensive into the 80s systolic but blood pressures been increased to 100 systolic. She's not tachycardic with this. Patient is awaiting mobile crisis reassessment at this time. Behavioral health team in the emergency department is aware of the patient. They have confirmed that her reevaluation in disposition is to be completed by mobile crisis.  Cyndra Numbers, MD 03/23/11 [phone]

## 2011-03-23 NOTE — ED Notes (Signed)
Lab bedside to obtain sample, RT bedside to analyze VBG

## 2011-04-09 ENCOUNTER — Encounter (HOSPITAL_COMMUNITY): Payer: Self-pay | Admitting: Emergency Medicine

## 2011-04-09 ENCOUNTER — Emergency Department (HOSPITAL_COMMUNITY)
Admission: EM | Admit: 2011-04-09 | Discharge: 2011-04-10 | Disposition: A | Discharge status: Home / Self Care | Payer: Medicare Other | Attending: Emergency Medicine | Admitting: Emergency Medicine

## 2011-04-09 DIAGNOSIS — S51809A Unspecified open wound of unspecified forearm, initial encounter: Secondary | ICD-10-CM | POA: Insufficient documentation

## 2011-04-09 DIAGNOSIS — X789XXA Intentional self-harm by unspecified sharp object, initial encounter: Secondary | ICD-10-CM | POA: Insufficient documentation

## 2011-04-09 DIAGNOSIS — Z7289 Other problems related to lifestyle: Secondary | ICD-10-CM

## 2011-04-09 DIAGNOSIS — IMO0002 Reserved for concepts with insufficient information to code with codable children: Secondary | ICD-10-CM | POA: Insufficient documentation

## 2011-04-09 DIAGNOSIS — F489 Nonpsychotic mental disorder, unspecified: Secondary | ICD-10-CM | POA: Insufficient documentation

## 2011-04-09 LAB — DIFFERENTIAL
Basophils Absolute: 0.1 10*3/uL (ref 0.0–0.1)
Basophils Relative: 1 % (ref 0–1)
Eosinophils Relative: 1 % (ref 0–5)
Lymphocytes Relative: 32 % (ref 12–46)
Monocytes Absolute: 0.4 10*3/uL (ref 0.1–1.0)

## 2011-04-09 LAB — CBC
HCT: 37.8 % (ref 36.0–46.0)
MCHC: 33.6 g/dL (ref 30.0–36.0)
MCV: 90.6 fL (ref 78.0–100.0)
Platelets: 301 10*3/uL (ref 150–400)
RDW: 12.1 % (ref 11.5–15.5)
WBC: 6.4 10*3/uL (ref 4.0–10.5)

## 2011-04-09 LAB — COMPREHENSIVE METABOLIC PANEL
ALT: 37 U/L — ABNORMAL HIGH (ref 0–35)
AST: 23 U/L (ref 0–37)
Albumin: 3.8 g/dL (ref 3.5–5.2)
Calcium: 9.3 mg/dL (ref 8.4–10.5)
Creatinine, Ser: 0.66 mg/dL (ref 0.50–1.10)
GFR calc non Af Amer: >90 mL/min (ref >90)
Sodium: 138 mEq/L (ref 135–145)
Total Protein: 6.7 g/dL (ref 6.0–8.3)

## 2011-04-09 LAB — RAPID URINE DRUG SCREEN, HOSP PERFORMED
Amphetamines: NOT DETECTED
Barbiturates: NOT DETECTED
Benzodiazepines: NOT DETECTED
Cocaine: NOT DETECTED
Tetrahydrocannabinol: NOT DETECTED

## 2011-04-09 LAB — ETHANOL: Alcohol, Ethyl (B): 210 mg/dL — ABNORMAL HIGH (ref 0–11)

## 2011-04-09 LAB — PREGNANCY, URINE: Preg Test, Ur: NEGATIVE

## 2011-04-09 MED ORDER — SODIUM CHLORIDE 0.9 % IV BOLUS (SEPSIS)
1000.0000 mL | Freq: Once | INTRAVENOUS | Status: AC
  Administered 2011-04-09: 1000 mL via INTRAVENOUS

## 2011-04-09 MED ORDER — DTAP-HEPATITIS B RECOMB-IPV IM SUSP
0.5000 mL | Freq: Once | INTRAMUSCULAR | Status: DC
  Filled 2011-04-09: qty 0.5

## 2011-04-09 MED ORDER — DIVALPROEX SODIUM 250 MG PO DR TAB
250.0000 mg | DELAYED_RELEASE_TABLET | Freq: Two times a day (BID) | ORAL | Status: DC
  Filled 2011-04-09 (×2): qty 1

## 2011-04-09 MED ORDER — HALOPERIDOL LACTATE 5 MG/ML IJ SOLN
INTRAMUSCULAR | Status: AC
  Administered 2011-04-09: 10 mg via INTRAMUSCULAR
  Filled 2011-04-09: qty 2

## 2011-04-09 MED ORDER — HALOPERIDOL LACTATE 5 MG/ML IJ SOLN
10.0000 mg | Freq: Once | INTRAMUSCULAR | Status: AC
  Administered 2011-04-09: 10 mg via INTRAMUSCULAR

## 2011-04-09 MED ORDER — ONDANSETRON HCL 4 MG PO TABS: 4.0000 mg | ORAL_TABLET | Freq: Three times a day (TID) | ORAL | Status: DC | PRN

## 2011-04-09 MED ORDER — LORAZEPAM 1 MG PO TABS: 1.0000 mg | ORAL_TABLET | Freq: Three times a day (TID) | ORAL | Status: DC | PRN

## 2011-04-09 MED ORDER — TETANUS-DIPHTH-ACELL PERTUSSIS 5-2.5-18.5 LF-MCG/0.5 IM SUSP
0.5000 mL | Freq: Once | INTRAMUSCULAR | Status: AC
  Administered 2011-04-09: 0.5 mL via INTRAMUSCULAR
  Filled 2011-04-09: qty 0.5

## 2011-04-09 MED ORDER — IBUPROFEN 600 MG PO TABS: 600.0000 mg | ORAL_TABLET | Freq: Three times a day (TID) | ORAL | Status: DC | PRN

## 2011-04-09 MED ORDER — ACETAMINOPHEN 325 MG PO TABS: 650.0000 mg | ORAL_TABLET | ORAL | Status: DC | PRN

## 2011-04-09 MED ORDER — ZOLPIDEM TARTRATE 5 MG PO TABS
5.0000 mg | ORAL_TABLET | Freq: Every evening | ORAL | Status: DC | PRN
  Administered 2011-04-09: 5 mg via ORAL
  Filled 2011-04-09: qty 1

## 2011-04-09 MED ORDER — CITALOPRAM HYDROBROMIDE 20 MG PO TABS
20.0000 mg | ORAL_TABLET | Freq: Every day | ORAL | Status: DC
  Administered 2011-04-09 – 2011-04-10 (×2): 20 mg via ORAL
  Filled 2011-04-09 (×3): qty 1

## 2011-04-09 NOTE — ED Provider Notes (Signed)
History     CSN: [id number]  Arrival date & time 04/09/11  0143   First MD Initiated Contact with Patient 04/09/11 0203      Chief Complaint  Patient presents with  . Extremity Laceration  . Suicidal   Level 5 due to agitation and lack of cooperation (Consider location/radiation/quality/duration/timing/severity/associated sxs/prior treatment) HPI Patient escorted in by [place] [employer] with her reports that she had a self-inflicted laceration to her left forearm and has been suicidal. There is also report of alcohol intoxication. Patient does not give any history 2 providers.  Past Medical History  Diagnosis Date  . Asthma   . Mental disorder PTSD Depression Schizophrenic disorder,  anxiety  . Depression   . Deliberate self-cutting     Past Surgical History  Procedure Date  . Foot surgery     Family History  Problem Relation Age of Onset  . Diabetes Other   . Depression Maternal Uncle     History  Substance Use Topics  . Smoking status: Current Everyday Smoker -- 0.5 packs/day    Types: Cigarettes  . Smokeless tobacco: Not on file  . Alcohol Use: 13.2 oz/week    12 Cans of beer, 10 Shots of liquor per week     Heavy    OB History    Grav Para Term Preterm Abortions TAB SAB Ect Mult Living                  Review of Systems  Unable to perform ROS   Allergies  Review of patient's allergies indicates no known allergies.  Home Medications   Current Outpatient Rx  Name Route Sig Dispense Refill  . FLUOXETINE HCL 10 MG PO CAPS Oral Take 3 capsules (30 mg total) by mouth daily. 90 capsule 0  . TRAZODONE HCL 50 MG PO TABS Oral Take 50 mg by mouth at bedtime.        BP 113/79  Pulse 100  Temp(Src) 98.2 F (36.8 C) (Oral)  Resp 20  SpO2 97%  Physical Exam  Nursing note and vitals reviewed. Constitutional: She appears well-developed.  HENT:  Head: Normocephalic and atraumatic.  Right Ear: External ear normal.  Left Ear: External ear normal.  Nose: Nose  normal.  Mouth/Throat: Oropharynx is clear and moist.  Eyes: Conjunctivae are normal. Pupils are equal, round, and reactive to light.  Neck: Normal range of motion.  Cardiovascular: Normal rate, regular rhythm and normal heart sounds.   Pulmonary/Chest: Effort normal.  Abdominal: Soft. Bowel sounds are normal.  Musculoskeletal:       Multiple superficial lacerations of left forearm  Neurological:       Patient sleeping a but moves all 4 extremities with command and will roll over and follow commands.  Skin: Skin is warm.    ED Course  Procedures (including critical care time)  Labs Reviewed - No data to display No results found.   No diagnosis found.    MDM  Patient initially extremely agitated, screaming at staff, and attempting to destroy surroundings. Patient received ball 10 mg IM and was placed in 4. restraints. My physical exam was done after the Haldol had been administered.        Hilario Quarry, MD 04/10/11 [phone]

## 2011-04-09 NOTE — ED Notes (Signed)
Information sent for tele psche-phone call made for consultation

## 2011-04-09 NOTE — ED Notes (Signed)
Pt. moved to psyche ED to room #42. 2 bags of belongings in locker #42.
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
## 2011-04-09 NOTE — ED Notes (Signed)
Pt states she feels safe-denies SI/HI at this time

## 2011-04-09 NOTE — ED Notes (Signed)
Waiting for Dr Trisha Mangle, for tele psche, monitor in room

## 2011-04-09 NOTE — ED Provider Notes (Signed)
telepsych recommends admits meds updated BP 90/55  Pulse 83  Temp(Src) 98.4 F (36.9 C) (Oral)  Resp 18  SpO2 95%   Joya Gaskins, MD 04/09/11 [phone]

## 2011-04-09 NOTE — BH Assessment (Signed)
Pt denied at [facility] due to pt having multiple CIRTs in prior inpatient episodes and pt is constant elopement risk.

## 2011-04-09 NOTE — ED Notes (Signed)
Report to Toniann Fail, RN-transfer to psyche rm 37

## 2011-04-09 NOTE — ED Notes (Signed)
Report received, airway intact-pt resting quietly, no s/s's of distress-will continue to monitor
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
## 2011-04-10 NOTE — ED Notes (Signed)
Up on the phone calling for a ride

## 2011-04-10 NOTE — ED Notes (Signed)
Up to the desk on the phone 

## 2011-04-10 NOTE — ED Notes (Signed)
Dr allen into see 

## 2011-04-10 NOTE — ED Provider Notes (Signed)
3:58 PM I just spoke with Dr. Henderson Cloud, the psychiatrist (telepsych) has reassessed the patient and recommends that she be discharged.  Gerhard Munch, MD 04/10/11 [phone]

## 2011-04-10 NOTE — ED Notes (Signed)
Belongings returned

## 2011-04-10 NOTE — ED Notes (Signed)
Pt requesting to talk w/ the MD/ psychatrist about going home.  Pt reports that she was not awake yesterday when she talked to him.  Pt reports that she does want long term treatment, but wants to arrange it later.  Pt is aware that the psychatrist has reccommended in pt treatment.

## 2011-04-10 NOTE — ED Notes (Signed)
telepsych in progress 

## 2011-04-10 NOTE — ED Notes (Signed)
Dr Freida Busman in--repeat telepsych

## 2011-04-10 NOTE — ED Notes (Signed)
Dc instructions reviewed w/ pt.  Pt reports she is not SI and contract fro safety on dc.  Pt encouraged to follow up w/ her therapist on Tuesday as scheduled and take her medications as directed.  Pt encouraged to return/seek treatment for any suicidal thoughts or urges and to avoid alcohol.  Pt verbalized understanding.

## 2011-04-10 NOTE — ED Notes (Signed)
Up to the desk on the phone-pt is aware that the telepsych will be repeated, and remains pleasant and cooperative

## 2011-04-10 NOTE — ED Provider Notes (Addendum)
Pt seen and examined. No new complaints. Pt on meds as reccomended by tele-psych, awaiting placement  Toy Baker, MD 04/10/11 1308  Toy Baker, MD 04/10/11 [phone]

## 2011-04-20 ENCOUNTER — Emergency Department (HOSPITAL_COMMUNITY)
Admission: EM | Admit: 2011-04-20 | Discharge: 2011-04-25 | Disposition: A | Discharge status: Other Acute Inpatient Hospital | Payer: Medicare Other | Attending: Emergency Medicine | Admitting: Emergency Medicine

## 2011-04-20 ENCOUNTER — Encounter (HOSPITAL_COMMUNITY): Payer: Self-pay | Admitting: Emergency Medicine

## 2011-04-20 ENCOUNTER — Other Ambulatory Visit: Payer: Self-pay

## 2011-04-20 DIAGNOSIS — F17200 Nicotine dependence, unspecified, uncomplicated: Secondary | ICD-10-CM | POA: Insufficient documentation

## 2011-04-20 DIAGNOSIS — F3289 Other specified depressive episodes: Secondary | ICD-10-CM | POA: Insufficient documentation

## 2011-04-20 DIAGNOSIS — IMO0002 Reserved for concepts with insufficient information to code with codable children: Secondary | ICD-10-CM | POA: Insufficient documentation

## 2011-04-20 DIAGNOSIS — T50904A Poisoning by unspecified drugs, medicaments and biological substances, undetermined, initial encounter: Secondary | ICD-10-CM | POA: Insufficient documentation

## 2011-04-20 DIAGNOSIS — F29 Unspecified psychosis not due to a substance or known physiological condition: Secondary | ICD-10-CM | POA: Insufficient documentation

## 2011-04-20 DIAGNOSIS — R9431 Abnormal electrocardiogram [ECG] [EKG]: Secondary | ICD-10-CM | POA: Insufficient documentation

## 2011-04-20 DIAGNOSIS — T50901A Poisoning by unspecified drugs, medicaments and biological substances, accidental (unintentional), initial encounter: Secondary | ICD-10-CM | POA: Insufficient documentation

## 2011-04-20 DIAGNOSIS — F329 Major depressive disorder, single episode, unspecified: Secondary | ICD-10-CM

## 2011-04-20 LAB — BASIC METABOLIC PANEL
CO2: 24 mEq/L (ref 19–32)
Chloride: 104 mEq/L (ref 96–112)
Creatinine, Ser: 0.69 mg/dL (ref 0.50–1.10)
GFR calc Af Amer: >90 mL/min (ref >90)
Sodium: 138 mEq/L (ref 135–145)

## 2011-04-20 LAB — URINALYSIS, ROUTINE W REFLEX MICROSCOPIC
Bilirubin Urine: NEGATIVE
Glucose, UA: NEGATIVE mg/dL
Ketones, ur: NEGATIVE mg/dL
Leukocytes, UA: NEGATIVE
Nitrite: NEGATIVE
Specific Gravity, Urine: 1.01 (ref 1.005–1.030)
pH: 5.5 (ref 5.0–8.0)

## 2011-04-20 LAB — CBC
Platelets: 314 10*3/uL (ref 150–400)
RBC: 4.32 MIL/uL (ref 3.87–5.11)
RDW: 12.2 % (ref 11.5–15.5)
WBC: 6.3 10*3/uL (ref 4.0–10.5)

## 2011-04-20 LAB — RAPID URINE DRUG SCREEN, HOSP PERFORMED
Amphetamines: NOT DETECTED
Cocaine: NOT DETECTED
Opiates: NOT DETECTED
Tetrahydrocannabinol: NOT DETECTED

## 2011-04-20 LAB — PREGNANCY, URINE: Preg Test, Ur: NEGATIVE

## 2011-04-20 LAB — SALICYLATE LEVEL: Salicylate Lvl: <2 mg/dL — ABNORMAL LOW (ref 2.8–20.0)

## 2011-04-20 MED ORDER — ACETAMINOPHEN 325 MG PO TABS: 650.0000 mg | ORAL_TABLET | ORAL | Status: DC | PRN

## 2011-04-20 MED ORDER — FLUOXETINE HCL 20 MG PO CAPS
30.0000 mg | ORAL_CAPSULE | Freq: Every day | ORAL | Status: DC
  Administered 2011-04-21: 30 mg via ORAL
  Administered 2011-04-23: 10 mg via ORAL
  Administered 2011-04-24: 30 mg via ORAL
  Filled 2011-04-20 (×5): qty 1

## 2011-04-20 MED ORDER — ALUM & MAG HYDROXIDE-SIMETH 200-200-20 MG/5ML PO SUSP: 30.0000 mL | ORAL | Status: DC | PRN

## 2011-04-20 MED ORDER — SODIUM BICARBONATE 8.4 % IV SOLN
INTRAVENOUS | Status: DC
  Administered 2011-04-20: 06:00:00 via INTRAVENOUS
  Filled 2011-04-20 (×6): qty 1000

## 2011-04-20 MED ORDER — FLUOXETINE HCL 20 MG PO CAPS
30.0000 mg | ORAL_CAPSULE | Freq: Every day | ORAL | Status: DC
  Filled 2011-04-20 (×2): qty 1

## 2011-04-20 MED ORDER — LORAZEPAM 1 MG PO TABS
1.0000 mg | ORAL_TABLET | Freq: Three times a day (TID) | ORAL | Status: DC | PRN
  Administered 2011-04-20 – 2011-04-24 (×6): 1 mg via ORAL
  Filled 2011-04-20 (×6): qty 1

## 2011-04-20 MED ORDER — IBUPROFEN 600 MG PO TABS
600.0000 mg | ORAL_TABLET | Freq: Three times a day (TID) | ORAL | Status: DC | PRN
  Administered 2011-04-20: 600 mg via ORAL
  Filled 2011-04-20: qty 1

## 2011-04-20 MED ORDER — NICOTINE 21 MG/24HR TD PT24
21.0000 mg | MEDICATED_PATCH | Freq: Every day | TRANSDERMAL | Status: DC
  Administered 2011-04-21 – 2011-04-24 (×4): 21 mg via TRANSDERMAL
  Filled 2011-04-20 (×4): qty 1

## 2011-04-20 MED ORDER — DEXTROSE 5 % IV BOLUS: 1000.0000 mL | Freq: Once | INTRAVENOUS | Status: DC

## 2011-04-20 MED ORDER — IBUPROFEN 600 MG PO TABS: 600.0000 mg | ORAL_TABLET | Freq: Three times a day (TID) | ORAL | Status: DC | PRN

## 2011-04-20 MED ORDER — SODIUM CHLORIDE 0.9 % IV BOLUS (SEPSIS): 1000.0000 mL | Freq: Once | INTRAVENOUS | Status: DC

## 2011-04-20 MED ORDER — ZOLPIDEM TARTRATE 5 MG PO TABS
5.0000 mg | ORAL_TABLET | Freq: Every evening | ORAL | Status: DC | PRN
  Administered 2011-04-20 – 2011-04-24 (×3): 5 mg via ORAL
  Filled 2011-04-20 (×3): qty 1

## 2011-04-20 MED ORDER — ONDANSETRON HCL 4 MG PO TABS: 4.0000 mg | ORAL_TABLET | Freq: Three times a day (TID) | ORAL | Status: DC | PRN

## 2011-04-20 MED ORDER — CHARCOAL ACTIVATED PO LIQD
ORAL | Status: AC
  Filled 2011-04-20: qty 240

## 2011-04-20 MED ORDER — SODIUM CHLORIDE 0.9 % IV SOLN: Freq: Once | INTRAVENOUS | Status: DC

## 2011-04-20 NOTE — ED Notes (Signed)
Pt transferred to Psych ED.  Report given to Velna Hatchet, [place].
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
## 2011-04-20 NOTE — ED Notes (Signed)
ACT team in. 

## 2011-04-20 NOTE — ED Notes (Signed)
Spoke with Poison Control-states watch for prolonged QT-obtain EKG-states mild drowsiness/somnolence, mild hypotension-observe for 6 hours-No charcoal at this time-pt may experience GI symptoms

## 2011-04-20 NOTE — ED Notes (Signed)
EKG done and shown to Dr. Preston Fleeting

## 2011-04-20 NOTE — ED Provider Notes (Signed)
Updated poison control center who closed the case.  Forbes Cellar, MD 04/20/11 1327

## 2011-04-20 NOTE — ED Provider Notes (Signed)
BP 89/45  Pulse 66  Temp(Src) 98 F (36.7 C) (Oral)  Resp 14  SpO2 100%   0837 Pt signed out from Dr. Nicanor Alcon. Pt without complaints at this time. Hypotension as above. Possible trazadone ingestion. Hypotension may be side effect overdose. 1L bolus IVF ordered. Reassess.   Forbes Cellar, MD 04/20/11 [phone]

## 2011-04-20 NOTE — ED Provider Notes (Signed)
History     CSN: [id number]  Arrival date & time 04/20/11  0354   First MD Initiated Contact with Patient 04/20/11 [phone]      Chief Complaint  Patient presents with  . Medical Clearance    (Consider location/radiation/quality/duration/timing/severity/associated sxs/prior treatment) Patient is a 26 y.o. female presenting with mental health disorder and drug/alcohol assessment. The history is provided by the police. The history is limited by the condition of the patient. No language interpreter was used.  Mental Health Problem The primary symptoms include bizarre behavior and disorganized speech. The current episode started yesterday. This is a recurrent problem.  The bizarre behavior started more than 1 month ago. The bizarre behavior appears to have been unchanged since its onset. She has abnormal social behavior, sexual behavior, agression and agitated behavior.  The onset of the illness is precipitated by alcohol abuse and drug abuse. The degree of incapacity that she is experiencing as a consequence of her illness is severe. Sequelae: unknown. Additional symptoms of the illness include flight of ideas and poor judgment. She has already injured self. She does not contemplate injuring another person. Risk factors that are present for mental illness include a history of mental illness and substance abuse.  Drug / Alcohol Assessment Primary symptoms include self-injury. This is a recurrent problem. The current episode started yesterday. The problem has not changed since onset.Suspected agents include alcohol (marijuana). Pertinent negatives include no bladder incontinence and no bowel incontinence.    Past Medical History  Diagnosis Date  . Asthma   . Mental disorder PTSD Depression Schizophrenic disorder,  anxiety  . Depression   . Deliberate self-cutting     Past Surgical History  Procedure Date  . Foot surgery     Family History  Problem Relation Age of Onset  . Diabetes Other    . Depression Maternal Uncle     History  Substance Use Topics  . Smoking status: Current Everyday Smoker -- 0.5 packs/day    Types: Cigarettes  . Smokeless tobacco: Not on file  . Alcohol Use: 13.2 oz/week    12 Cans of beer, 10 Shots of liquor per week     Heavy    OB History    Grav Para Term Preterm Abortions TAB SAB Ect Mult Living                  Review of Systems  Unable to perform ROS Gastrointestinal: Negative for bowel incontinence.  Genitourinary: Negative for bladder incontinence.  Psychiatric/Behavioral: Positive for self-injury.    Allergies  Review of patient's allergies indicates no known allergies.  Home Medications   Current Outpatient Rx  Name Route Sig Dispense Refill  . FLUOXETINE HCL 10 MG PO CAPS Oral Take 3 capsules (30 mg total) by mouth daily. 90 capsule 0  . TRAZODONE HCL 50 MG PO TABS Oral Take 50 mg by mouth at bedtime.        BP 113/68  Pulse 60  Temp(Src) 98 F (36.7 C) (Oral)  Resp 14  SpO2 98%  Physical Exam  Constitutional: She appears well-developed and well-nourished.  HENT:  Head: Normocephalic and atraumatic.  Mouth/Throat: Oropharynx is clear and moist.  Eyes: Conjunctivae are normal. Pupils are equal, round, and reactive to light.  Neck: Normal range of motion. Neck supple. No tracheal deviation present.  Cardiovascular: Normal rate and regular rhythm.   Pulmonary/Chest: Effort normal and breath sounds normal. She has no wheezes. She has no rales.  Superficial cuts to the chest wall  Abdominal: Soft. Bowel sounds are normal.  Genitourinary:       Chaperones present no glass visible on speculum exam in introitus  Musculoskeletal: She exhibits no edema.  Neurological: She is alert. She has normal reflexes.  Skin: Skin is warm and dry.  Psychiatric: Her affect is labile and inappropriate. She is agitated and aggressive. She expresses impulsivity.    ED Course  Procedures (including critical care time)  Labs  Reviewed  BASIC METABOLIC PANEL - Abnormal; Notable for the following:    Glucose, Bld 106 (*)    All other components within normal limits  ETHANOL - Abnormal; Notable for the following:    Alcohol, Ethyl (B) 189 (*)    All other components within normal limits  SALICYLATE LEVEL - Abnormal; Notable for the following:    Salicylate Lvl <2.0 (*)    All other components within normal limits  CBC  URINALYSIS, ROUTINE W REFLEX MICROSCOPIC  ACETAMINOPHEN LEVEL  URINE RAPID DRUG SCREEN (HOSP PERFORMED)  PREGNANCY, URINE   No results found.   No diagnosis found.    MDM   Date: 04/20/2011  Rate: 88  Rhythm: normal sinus rhythm  QRS Axis: normal  Intervals: QT prolonged  ST/T Wave abnormalities: normal  Conduction Disutrbances:none  Narrative Interpretation:   Old EKG Reviewed: changes noted     Monitor for 6 hours and repeat EKG per poison control    Luismario Coston K Pennye Beeghly-Rasch, MD 04/20/11 [phone]

## 2011-04-20 NOTE — ED Provider Notes (Signed)
BP 113/84  Pulse 88  Temp(Src) 97.7 F (36.5 C) (Oral)  Resp 18  SpO2 97%   Patient continues to remain asx. Hypotension previously noted improved after IVF. Blood pressure now stable. Medically cleared to move to psych  Forbes Cellar, MD 04/20/11 1311

## 2011-04-20 NOTE — ED Notes (Signed)
Per EMS pt is stating she is suicidal has superficial lacerations noted to her left arm, states she took trazadone 10 tablets, smoking pot, and has ETOH on board

## 2011-04-20 NOTE — ED Notes (Signed)
Has been cutting and burning, states that burning "feels good", she likes it. Really wants to try and get it right this time, wants help w/her problems and willing to go the distance.

## 2011-04-20 NOTE — ED Notes (Signed)
Report received from [place], [place]. Pt sleeping but arrousable. Cardiac monitor shows NSR with QT interval of 0.46. Bicarb drip infusing at 150cc/hr. Skin w/d, no distress.

## 2011-04-20 NOTE — ED Notes (Signed)
[id number]<GN> Expected date:<BR> Expected time:<BR> Means of arrival:<BR> Comments:<BR> EMS/suicidal-cut wrists-intoxicated

## 2011-04-21 MED ORDER — ZIPRASIDONE MESYLATE 20 MG IM SOLR
20.0000 mg | Freq: Once | INTRAMUSCULAR | Status: AC
  Administered 2011-04-21: 20 mg via INTRAMUSCULAR
  Filled 2011-04-21: qty 20

## 2011-04-21 NOTE — ED Notes (Addendum)
Pt has been declined at [facility] due to medical acuity. Pt continues to pend disposition.   Pt information has been sent to [facility] for review, however according to [facility] at [facility], the Admissions PA will not review the information until 04/22/11. Oncoming CSW/ACT to follow up regarding disposition.

## 2011-04-21 NOTE — ED Notes (Signed)
Nichole Lopez moved to Rm 36 since her room is very hot and facility is unable to fix heating glitch presently.

## 2011-04-21 NOTE — ED Provider Notes (Signed)
BP 129/85  Pulse 68  Temp(Src) 97.8 F (36.6 C) (Oral)  Resp 18  SpO2 98%  Patient seen and evaluated by me. No complaints at this time.   Forbes Cellar, MD 04/21/11 [phone]

## 2011-04-21 NOTE — ED Notes (Signed)
Pt sleeping soundly and allowed to do so undisturbed. Good night reported by off going staff RN.Pt has remained calm.

## 2011-04-21 NOTE — ED Notes (Signed)
Pt using an empty medicine cup to make scratches on her arms, neck and flank. Security removed plastic piece from her hand, and checked for any additional pieces of the  cup she may have had.

## 2011-04-21 NOTE — BH Assessment (Signed)
[facility] referral faxed. Spoke with Amor at [facility] who reviewed referral. B/c pt's dx includes alcohol abuse, he wants [place] to send referral to [facility]. If [facility] declines, get them to put it in writing. [facility] can then notify [facility], fax the decline letter, and then they'll review [facility] referral again.

## 2011-04-22 LAB — URINE MICROSCOPIC-ADD ON

## 2011-04-22 LAB — POCT I-STAT, CHEM 8
BUN: 17 mg/dL (ref 6–23)
Creatinine, Ser: 0.7 mg/dL (ref 0.50–1.10)
Sodium: 139 mEq/L (ref 135–145)
TCO2: 24 mmol/L (ref 0–100)

## 2011-04-22 LAB — URINALYSIS, ROUTINE W REFLEX MICROSCOPIC
Glucose, UA: NEGATIVE mg/dL
Protein, ur: NEGATIVE mg/dL
Specific Gravity, Urine: 1.026 (ref 1.005–1.030)
pH: 6.5 (ref 5.0–8.0)

## 2011-04-22 MED ORDER — ZIPRASIDONE MESYLATE 20 MG IM SOLR
INTRAMUSCULAR | Status: AC
  Filled 2011-04-22: qty 20

## 2011-04-22 MED ORDER — HALOPERIDOL LACTATE 5 MG/ML IJ SOLN
2.0000 mg | Freq: Once | INTRAMUSCULAR | Status: AC
  Administered 2011-04-22: 2 mg via INTRAMUSCULAR
  Filled 2011-04-22: qty 1

## 2011-04-22 MED ORDER — ZIPRASIDONE MESYLATE 20 MG IM SOLR
10.0000 mg | Freq: Once | INTRAMUSCULAR | Status: AC
  Administered 2011-04-22: 10 mg via INTRAMUSCULAR

## 2011-04-22 MED ORDER — ZIPRASIDONE MESYLATE 20 MG IM SOLR
20.0000 mg | Freq: Once | INTRAMUSCULAR | Status: AC
  Administered 2011-04-22: 20 mg via INTRAMUSCULAR

## 2011-04-22 MED ORDER — ZIPRASIDONE MESYLATE 20 MG IM SOLR
INTRAMUSCULAR | Status: AC
  Administered 2011-04-22: 20 mg via INTRAMUSCULAR
  Filled 2011-04-22: qty 20

## 2011-04-22 MED ORDER — LORAZEPAM 2 MG/ML IJ SOLN
1.0000 mg | Freq: Once | INTRAMUSCULAR | Status: AC
  Administered 2011-04-22: 1 mg via INTRAMUSCULAR
  Filled 2011-04-22: qty 1

## 2011-04-22 NOTE — ED Notes (Signed)
Pt sitting on stretcher rocking back and forth, smiling, receptive to questions, stating "I was in [facility] last trying to get my GED".

## 2011-04-22 NOTE — ED Notes (Signed)
TC with Margie @ [facility]. Let her know that info has been sent to [facility] and awaiting word back from them on review. Will notify them when we hear something regarding acceptance.

## 2011-04-22 NOTE — Progress Notes (Signed)
Initial visit with pt.  Saw pt in [facility] ED group co-facilitated with Doctoral Counseling Intern Juanetta Beets.   Group focused on hope and resiliency.  After checking in, group members brainstormed definitions of hope and connections with their narrative.  Discussed periods in their life where they have recovered hope and what resources they drew on.  Group completed a "hope mandala" focusing on what was hopeful for them in the moment.   Nikeya was engaged during first portion of group, but became less focused as group moved toward written activity.  During hope mandala portion, Jazleen drew inappropriate material and was redirected by group leaders.  Starlyn spoke about desire to self-injure, wanting help and lack of resources.  Nikka spoke about her sister as being a positive resource for her and finding hope in connections.    Belva Crome, [place] Chaplain      04/22/11 1500  Clinical Encounter Type  Visited With Patient;Other (Comment) (Group members)  Visit Type Initial;Spiritual support;Social support;Psychological support;ED;Behavioral Health  Referral From Other (Comment) (Saw pt in behavioral health group)  Spiritual Encounters  Spiritual Needs Emotional  Stress Factors  Patient Stress Factors Lack of caregivers;Family relationships

## 2011-04-22 NOTE — ED Notes (Signed)
Pt flashing female patients & attempting to push on exit door, pt redirected to room, Dr. Juleen China informed, order to be placed for geodon by Dr. Juleen China

## 2011-04-22 NOTE — ED Notes (Signed)
Pt in Group with Chaplain's  

## 2011-04-22 NOTE — ED Notes (Signed)
Pt. Had 1/2 of of fork on bedside table when RN went to pick up meal tray.  Asked pt. To give [place] the other 1/2 of of the fork, pt. Did given RN the broken fork half, noticed that 2 other food containers had been broken in 1/2.  When pt asked to give those broken pieces to RN, pt. Gave 1 and hid the other in her shirt.  Security called to assist to get the 3rd broken piece back.  Pt. Became very loud and states that she did not have any other pieces on her, pt. Asked to change into new blue scrubs, pt. Did this but hid 3rd piece in pocket of new scrub top.  Security and staff got 3rd piece back, EDP notified of pt.'s behavior.  Pt. Came out of room and began pushing on exit doors and running in the hallway.  EDP gave new medication order.  Medication given.

## 2011-04-22 NOTE — ED Notes (Signed)
TC to [facility]. Spoke with Arlys John. Stated that if the information was faxed over to them yesterday, someone would be getting back with [place] once the information was reviewed. Informed that a "stack" was being reviewed and someone would get back with [place].

## 2011-04-22 NOTE — ED Provider Notes (Signed)
Patient is seen and examined. She was acting out in the psychiatric EGD. She had been given Geodon with no significant relief. She was then given 2 mg of Haldol and Ativan and continued to act out. She was uncooperative and belligerent so the police were called to the ED. She is now acting normally, and much more, cooperative we'll place soft wrist restraints for her own safety and staff safety. We'll check an i-STAT 8 and will continue to follow  Nichole Lopez A. Patrica Duel, MD 04/22/11 2021

## 2011-04-23 MED ORDER — TRAZODONE HCL 50 MG PO TABS
50.0000 mg | ORAL_TABLET | Freq: Every day | ORAL | Status: DC
  Administered 2011-04-23 – 2011-04-24 (×2): 50 mg via ORAL
  Filled 2011-04-23 (×2): qty 1

## 2011-04-23 MED ORDER — LORAZEPAM 2 MG/ML IJ SOLN
1.0000 mg | Freq: Once | INTRAMUSCULAR | Status: AC
  Administered 2011-04-23: 1 mg via INTRAMUSCULAR
  Filled 2011-04-23: qty 1

## 2011-04-23 NOTE — ED Notes (Signed)
Sitter in w/ pt

## 2011-04-23 NOTE — ED Notes (Signed)
Up to the desk on the phone 

## 2011-04-23 NOTE — ED Notes (Signed)
Sitter at bedside.

## 2011-04-23 NOTE — ED Notes (Signed)
Dr Lynelle Doctor aware of pt's arms

## 2011-04-23 NOTE — ED Notes (Signed)
Superficial abrasions inner forearms cleaned w/ NS, no bleeding noted.  Pt encouraged not to harm herself.  Pt reported that that she was also feeling "homicidal" and was angry at another pt in the dept and was threathening to "hit her...strangle her."  Pt encouraged to stay away from other pt and agreed to do so. Charge nurse aware, security into talk w/ pt.

## 2011-04-23 NOTE — ED Notes (Signed)
Remains pleasant and cooperative, but is aggitated,  NAD.

## 2011-04-23 NOTE — ED Notes (Signed)
Dr knapp into see 

## 2011-04-23 NOTE — ED Provider Notes (Signed)
The patient is resting comfortably this morning. No complaints or concerns.  Filed Vitals:   04/23/11 0612  BP: 118/79  Pulse: 63  Temp: 98.3 F (36.8 C)  Resp: 18    Heart: Regular rate and rhythm, normal S1-S2 lungs: Clear to auscultation  We will continue the current plan. Attempting to find psychiatric placement.  Celene Kras, MD 04/23/11 [phone]

## 2011-04-23 NOTE — ED Notes (Signed)
Up to the desk, reports feeling better

## 2011-04-23 NOTE — ED Notes (Signed)
Sitting in room, pt had taken a peanut butter cup container and was cutting her forearms.  Pt reported she was cutting "because I'm bored".  NAD, pt instructed not to do any more and agreed not to harm herself.

## 2011-04-23 NOTE — ED Notes (Signed)
Up tot he bathroom to shower and change scrubs 

## 2011-04-24 LAB — URINE MICROSCOPIC-ADD ON

## 2011-04-24 LAB — URINALYSIS, ROUTINE W REFLEX MICROSCOPIC
Bilirubin Urine: NEGATIVE
Glucose, UA: NEGATIVE mg/dL
Hgb urine dipstick: NEGATIVE
Ketones, ur: NEGATIVE mg/dL
Nitrite: NEGATIVE
Protein, ur: NEGATIVE mg/dL
Specific Gravity, Urine: 1.029 (ref 1.005–1.030)
Urobilinogen, UA: 0.2 mg/dL (ref 0.0–1.0)
pH: 6.5 (ref 5.0–8.0)

## 2011-04-24 MED ORDER — HALOPERIDOL LACTATE 5 MG/ML IJ SOLN
5.0000 mg | Freq: Once | INTRAMUSCULAR | Status: AC
  Administered 2011-04-24: 5 mg via INTRAMUSCULAR
  Filled 2011-04-24: qty 1

## 2011-04-24 MED ORDER — FLUOXETINE HCL 20 MG PO CAPS
40.0000 mg | ORAL_CAPSULE | Freq: Every day | ORAL | Status: DC
  Administered 2011-04-24 – 2011-04-25 (×2): 40 mg via ORAL
  Filled 2011-04-24 (×4): qty 2

## 2011-04-24 MED ORDER — DIPHENHYDRAMINE HCL 25 MG PO CAPS
50.0000 mg | ORAL_CAPSULE | Freq: Every day | ORAL | Status: DC
  Administered 2011-04-24: 50 mg via ORAL
  Filled 2011-04-24: qty 2

## 2011-04-24 MED ORDER — RISPERIDONE 1 MG PO TABS
1.0000 mg | ORAL_TABLET | Freq: Every day | ORAL | Status: DC
  Administered 2011-04-24: 1 mg via ORAL
  Filled 2011-04-24: qty 1

## 2011-04-24 MED ORDER — LORAZEPAM 2 MG/ML IJ SOLN
1.0000 mg | Freq: Once | INTRAMUSCULAR | Status: AC
  Administered 2011-04-24: 1 mg via INTRAMUSCULAR
  Filled 2011-04-24: qty 1

## 2011-04-24 NOTE — ED Notes (Signed)
Pt is aware that she is not going to be going home and why.  Pt upset, wanting to go home and reports that she is "going to do disruptive things and cut myself."  Pt reasurred that she will be placed as soon as possible and that she will be started on the meds soon

## 2011-04-24 NOTE — ED Notes (Signed)
Pt up the the bathroom seen sitting on the floor trying to cut her arm w/ unknown object.  Pt refused to give up the object,  Crying, wanting to go home..."I'm going to excape...", pt up in the hall hitting the door, security here assis, EDP aware, meds oprdered

## 2011-04-24 NOTE — ED Provider Notes (Addendum)
Telepsych consultation shows that pt is not psychiatriically stable and recommends inpt treatment.    Gavin Pound. Sheralyn Pinegar, MD 04/24/11 1450   Pt had neg upreg on 3/13, don't think need another today.  Will write for meds as recommended by telepsych.  UA from this AM doesn't suggest UTI.     Gavin Pound. Oletta Lamas, MD 04/24/11 1538  Gavin Pound. Dominick Zertuche, MD 04/24/11 1539

## 2011-04-24 NOTE — ED Notes (Signed)
Up in room pacing/rocking.  Reports she is getting bored and feels like she wants to cut.  Room secure, pt is aware that she will be talking to the psych this am.  Encouraged to use distraction/word puzzles to assist.

## 2011-04-24 NOTE — ED Notes (Signed)
Pt requesting additional meds for anxiety. Medicated, eatting supper, sitter at bedside

## 2011-04-24 NOTE — ED Notes (Signed)
In room, talking w/ staff and secuirity

## 2011-04-24 NOTE — ED Notes (Signed)
Calmer, but still figitiy, sitter at bedside, pt is aware that the EDP will talk w/ her when he can.  Reasurred.

## 2011-04-24 NOTE — ED Notes (Signed)
Up to the bathroom w/ sitter, requesting to speak to the EDP,  MD notified. Note--pt reported to staff that she got the piece of paper from the trash in the bathroom.

## 2011-04-24 NOTE — ED Notes (Signed)
Up to the bathroom 

## 2011-04-24 NOTE — ED Notes (Signed)
Security and staff in talking w/ pt

## 2011-04-24 NOTE — ED Notes (Signed)
Pt back to room, security at bedside, given med

## 2011-04-24 NOTE — ED Notes (Signed)
Laying on bed, still figiity at times

## 2011-04-24 NOTE — BH Assessment (Signed)
Assessment Note   Nichole Lopez is an 26 y.o. female.   Pt was at ED last weekend and discharged for similar behaviors.  Pt has returned and is under IVC.  Pt was reassessed.  Pt has had Tele Psych and initial assessment.  Pt pending wait list at [facility] due to being declined at [facility] for acuity.  Pt continues to need psych services unless a Tele Psych recommends discharge or MD.  Pt has long hx of coming to the ED after cutting self, OD and or using alcohol.  Pt has maximized therapeutic benefits at [facility].  See below for a more detail version of pt initial asssessment.   Nichole Lopez is an 26 y.o. female admitted to [facility] after she called 911 stating that she had cut her wrists and overdosed in a suicide attempt. Patient reports that she had been drinking heavily and took 10 Trazadone tablets. She denies suicidal thoughts right now but states that these thoughts are recurrent and frequent- at least daily. Patient states that she will never actually kill herself due to her religious beliefs ("God will not let me into heaven if I kill myself.") Patient states that she would like to "be killed"- preferably by a police officer and hopes that someday this will happen. Patient was agitated and combative on admission and had to be restrained. During assessment she was alert, calm and cooperative. She has had multiple recent admits to the ED and states she will probably continue to come to ED because "no one cares about me, no one will help me." Patient was admitted to [facility] in January, 2013, [facility] February 2013 and [facility] in February 2013. She states that she has a therapist assigned through [facility]; she saw therapist on 3/5 and was scheduled for 3/12 but did not go to the appointment. "I just decided to sleep in and didn't want to go." She is assigned to an ACT team- "[facility]" but she states she has not contacted them when she in crisis.She verbalizes understanding of the purpose of the ACT team but  does not appear inclined to contact them. She has contacted the crisis line but does not feel they are helpful because they "are not with me when I want to hurt myself" Her support system is her sister but she states that she is getting frustrated with her because she keeps coming to the emergency room. Patient reports ETOH at least 3 times a week- beer, wine and liquor. States that she smokes marijuana but does not use other drugs. Patient has daily telephonic and monthly contact with her 31 year Nichole ex-boyfriend whom she states she has tried to "kill" 2 times in the past by suffocation and choking; 2 days ago- she had thoughts of putting pills in his drink to kill him. She states he is like a "daddy figure" to her but she still has thoughts of wanting to kill him. (Patient noted to laugh when she was making this statement). Patient states that her father died of a drug overdose when she was 70; her mother died the next year "because I killed her". Patient states that she "wished her mother would die after she got mad at her." She denies know any other reason why her mother died.  Patient relates that she has long history of mental illness and states that she has PTSD, Anxiety, Depression, And Schizoid affective disorder. She states that she has researched and discovered that she also has Borderline Personality Disorder but  no one will take her seriously. "I looked it up and I know what's wrong with me." Patient is unable to contract for safety and states that the suicidal ideations will return. She wants to be placed inpatient so that she will not hurt herself but states that she wants to remain in [place] because she gets "homesick" for her phone and apartment. Patient noted to have multiple, visible signs of cutting to both arms. She states she started "burning" yesterday and loved the way it made her feel. She plans to continue this behavior.  Payee- Nichole Lopez- [place] [facility]  Sister: Nichole Lopez - [phone]   Axis I: Mood Disorder NOS and Substance Abuse Axis II: Borderline Personality Dis. Axis III:  Past Medical History  Diagnosis Date  . Asthma   . Mental disorder PTSD Depression Schizophrenic disorder,  anxiety  . Depression   . Deliberate self-cutting    Axis IV: other psychosocial or environmental problems, problems related to legal system/crime, problems related to social environment and problems with primary support group Axis V: 31-40 impairment in reality testing  Past Medical History:  Past Medical History  Diagnosis Date  . Asthma   . Mental disorder PTSD Depression Schizophrenic disorder,  anxiety  . Depression   . Deliberate self-cutting     Past Surgical History  Procedure Date  . Foot surgery     Family History:  Family History  Problem Relation Age of Onset  . Diabetes Other   . Depression Maternal Uncle     Social History:  reports that she has been smoking Cigarettes.  She has been smoking about .5 packs per day. She does not have any smokeless tobacco history on file. She reports that she drinks about 13.2 ounces of alcohol per week. She reports that she uses illicit drugs (Marijuana).  Additional Social History:    Allergies: No Known Allergies  Home Medications:  Medications Prior to Admission  Medication Dose Route Frequency Provider Last Rate Last Dose  . acetaminophen (TYLENOL) tablet 650 mg  650 mg Oral Q4H PRN April K Palumbo-Rasch, MD      . acetaminophen (TYLENOL) tablet 650 mg  650 mg Oral Q4H PRN Forbes Cellar, MD      . alum & mag hydroxide-simeth (MAALOX/MYLANTA) 200-200-20 MG/5ML suspension 30 mL  30 mL Oral PRN April K Palumbo-Rasch, MD      . alum & mag hydroxide-simeth (MAALOX/MYLANTA) 200-200-20 MG/5ML suspension 30 mL  30 mL Oral PRN Forbes Cellar, MD      . FLUoxetine (PROZAC) capsule 30 mg  30 mg Oral QHS Berkley Harvey, [place]   10 mg at 04/23/11 2147  . haloperidol lactate (HALDOL) injection 2 mg  2  mg Intramuscular Once Peter A. Patrica Duel, MD   2 mg at 04/22/11 1959  . ibuprofen (ADVIL,MOTRIN) tablet 600 mg  600 mg Oral Q8H PRN April K Palumbo-Rasch, MD   600 mg at 04/20/11 2115  . ibuprofen (ADVIL,MOTRIN) tablet 600 mg  600 mg Oral Q8H PRN Forbes Cellar, MD      . LORazepam (ATIVAN) injection 1 mg  1 mg Intramuscular Once Peter A. Patrica Duel, MD   1 mg at 04/22/11 2001  . LORazepam (ATIVAN) injection 1 mg  1 mg Intramuscular Once Celene Kras, MD   1 mg at 04/23/11 1503  . LORazepam (ATIVAN) tablet 1 mg  1 mg Oral Q8H PRN Forbes Cellar, MD   1 mg at 04/24/11 0843  . nicotine (NICODERM CQ -  dosed in mg/24 hours) patch 21 mg  21 mg Transdermal Daily Forbes Cellar, MD   21 mg at 04/24/11 0951  . ondansetron (ZOFRAN) tablet 4 mg  4 mg Oral Q8H PRN April K Palumbo-Rasch, MD      . ondansetron [facility]) tablet 4 mg  4 mg Oral Q8H PRN Forbes Cellar, MD      . traZODone (DESYREL) tablet 50 mg  50 mg Oral QHS Celene Kras, MD   50 mg at 04/23/11 2149  . ziprasidone (GEODON) 20 MG injection           . ziprasidone (GEODON) injection 10 mg  10 mg Intramuscular Once Peter A. Patrica Duel, MD   10 mg at 04/22/11 1906  . ziprasidone (GEODON) injection 20 mg  20 mg Intramuscular Once Lyanne Co, MD   20 mg at 04/21/11 2100  . ziprasidone (GEODON) injection 20 mg  20 mg Intramuscular Once Raeford Razor, MD   20 mg at 04/22/11 1400  . zolpidem (AMBIEN) tablet 5 mg  5 mg Oral QHS PRN Forbes Cellar, MD   5 mg at 04/23/11 2147  . DISCONTD: 0.9 %  sodium chloride infusion   Intravenous Once Forbes Cellar, MD      . DISCONTD: charcoal activated (NO SORBITOL) (ACTIDOSE-AQUA) suspension           . DISCONTD: dextrose 5 % 1,000 mL with sodium bicarbonate 150 mEq infusion   Intravenous Continuous Forbes Cellar, MD      . DISCONTD: dextrose 5 % bolus 1,000 mL  1,000 mL Intravenous Once Forbes Cellar, MD      . DISCONTD: FLUoxetine (PROZAC) 30 mg  30 mg Oral Daily Forbes Cellar, MD      . DISCONTD: sodium chloride 0.9 %  bolus 1,000 mL  1,000 mL Intravenous Once Forbes Cellar, MD       Medications Prior to Admission  Medication Sig Dispense Refill  . FLUoxetine (PROZAC) 10 MG capsule Take 3 capsules (30 mg total) by mouth daily.  90 capsule  0  . traZODone (DESYREL) 50 MG tablet Take 50 mg by mouth at bedtime.          OB/GYN Status:  No LMP recorded.  General Assessment Data [facility] of Assessment: [facility] ACT Assessment: Yes Living Arrangements: Alone Can pt return to current living arrangement?: Yes Admission Status: Involuntary Is patient capable of signing voluntary admission?: Yes Transfer from: Acute Hospital Referral Source: MD  Education Status Is patient currently in school?: No Current Grade: NA Highest grade of school patient has completed: NA Name of school: NA Contact person: NA  Risk to self Suicidal Ideation: No-Not Currently/Within Last 6 Months Suicidal Intent: No-Not Currently/Within Last 6 Months Is patient at risk for suicide?: Yes Suicidal Plan?: No-Not Currently/Within Last 6 Months Specify Current Suicidal Plan: pt has OD just last wk Access to Means: Yes Specify Access to Suicidal Means: has access to meds What has been your use of drugs/alcohol within the last 12 months?: THC and ETOH Previous Attempts/Gestures: Yes How many times?: 4  Other Self Harm Risks: hx of cutting and OD Triggers for Past Attempts: Unpredictable Intentional Self Injurious Behavior: Cutting;Burning Comment - Self Injurious Behavior: burning is new  Family Suicide History: Yes Recent stressful life event(s): Other (Comment) (life stress; pt seems better in ED) Persecutory voices/beliefs?: No Depression: Yes Depression Symptoms: Feeling angry/irritable;Feeling worthless/self pity;Guilt;Loss of interest in usual pleasures;Fatigue;Isolating Substance abuse history and/or treatment for substance abuse?: Yes Suicide prevention information given  to non-admitted patients: Not applicable  Risk  to Others Homicidal Ideation: No-Not Currently/Within Last 6 Months Thoughts of Harm to Others: No-Not Currently Present/Within Last 6 Months Current Homicidal Intent: No-Not Currently/Within Last 6 Months Current Homicidal Plan: No Access to Homicidal Means: Yes Describe Access to Homicidal Means: supposedly wanted to put pills in Ex BF drink Identified Victim: BF History of harm to others?: Yes Assessment of Violence: In past 6-12 months Violent Behavior Description: pt calm and cooperative Does patient have access to weapons?: No Criminal Charges Pending?: Yes Describe Pending Criminal Charges: drunk in public and assault on [employer] Does patient have a court date: Yes Court Date: 05/05/11  Psychosis Hallucinations: None noted Delusions: None noted  Mental Status Report Appear/Hygiene: Disheveled Eye Contact: Good Motor Activity: Unremarkable Speech: Logical/coherent Level of Consciousness: Alert Mood: Depressed;Anxious;Suspicious;Preoccupied Affect: Depressed;Anxious Anxiety Level: Minimal Thought Processes: Coherent Judgement: Impaired Orientation: Person;Place;Time;Situation Obsessive Compulsive Thoughts/Behaviors: None  Cognitive Functioning Concentration: Decreased Memory: Recent Intact;Remote Intact IQ: Average Insight: Poor Impulse Control: Poor Appetite: Fair Weight Loss: 0  Weight Gain: 0  Sleep: Increased (been in ED) Total Hours of Sleep: 8  Vegetative Symptoms: None  Prior Inpatient Therapy Prior Inpatient Therapy: Yes Prior Therapy Dates: 2012, 2013 Prior Therapy Facilty/Provider(s): [facility]; Nichole Lopez 03/2011 Reason for Treatment: Bipolar  Prior Outpatient Therapy Prior Outpatient Therapy: Yes Prior Therapy Dates: Current Prior Therapy Facilty/Provider(s): [facility]; [place] Reason for Treatment: ETOH Dependence; Rx management; Bipolar            Values / Beliefs Cultural Requests During Hospitalization: None Spiritual Requests During  Hospitalization: None     Nutrition Screen Diet: Regular  Additional Information 1:1 In Past 12 Months?: No CIRT Risk: Yes Elopement Risk: Yes Does patient have medical clearance?: Yes     Disposition:   Pt pending [facility].  There seems to be back and forth about whether pt is going to [facility] or [facility].  [facility] will not take referrals over the weekend.  This needs to be resolved Monday in AM. Disposition Disposition of Patient: Inpatient treatment program Type of inpatient treatment program: Adult Other disposition(s): Other (Comment) (pending [facility];  [facility] declined)  On Site Evaluation by:   Reviewed with Physician:     Titus Mould, Eppie Gibson 04/24/2011 10:25 AM

## 2011-04-24 NOTE — ED Notes (Signed)
Charge nurse aware, sitter requested

## 2011-04-24 NOTE — ED Notes (Signed)
Watching tv, still figity

## 2011-04-24 NOTE — ED Notes (Signed)
telepsych in progress 

## 2011-04-24 NOTE — ED Notes (Signed)
Up tot he bathroom to shower and change scrubs 

## 2011-04-24 NOTE — ED Notes (Signed)
Pt went to bathroom.  Pt attempted to cut self with something she found in the trash can.  Security called to bedside.  Pt in close supervision with nurse at this time.  Pt remains in bathroom at this time.

## 2011-04-24 NOTE — ED Notes (Signed)
100% dinner eaten.

## 2011-04-24 NOTE — ED Notes (Signed)
Orange juice/peanut butter crackers given

## 2011-04-25 MED ORDER — HALOPERIDOL LACTATE 5 MG/ML IJ SOLN
10.0000 mg | Freq: Once | INTRAMUSCULAR | Status: AC
  Administered 2011-04-25: 10 mg via INTRAMUSCULAR
  Filled 2011-04-25: qty 2

## 2011-04-25 MED ORDER — LORAZEPAM 2 MG/ML IJ SOLN
2.0000 mg | Freq: Four times a day (QID) | INTRAMUSCULAR | Status: DC | PRN
  Administered 2011-04-25: 2 mg via INTRAMUSCULAR
  Filled 2011-04-25: qty 1

## 2011-04-25 NOTE — ED Notes (Signed)
Cleaned abrasion on patients arm with soap and water. I asked patient how she did this. Patient stated "I used part of the styrofoam tray my breakfast came in."

## 2011-04-25 NOTE — ED Notes (Signed)
Pt up to restroom.

## 2011-04-25 NOTE — Discharge Planning (Signed)
[name] called and advised patient has been accepted by Dr. Elby Showers. Nurse to call report to [phone]. Patient to be transported by [facility] and should report to [place] building.  EDP and nurse notified.  Manson Passey Marcos Peloso ANN S , MSW, LCSWA 04/25/2011 12:47 PM [phone]

## 2011-04-25 NOTE — ED Notes (Signed)
Report called to [facility] at 1345 to [facility].  Pt. Going to [facility], [address].  Attempted to call Sheriff's Departmt. For transportation to [employer].  Left message for Sheriff's phone voice mail to call [facility] back.

## 2011-04-25 NOTE — ED Notes (Signed)
Pt. Came to RN station to ask about phone, asked pt. How many calls had she made this morning, pt. States only 1, RN saw 2 and pt. States Dude I only made 1 call, RN informed pt that she had made 2 calls, pt. Became very loud, yelling at nurse that she was wrong.  Pt. Not given phone asked to please try to calm down.  Pt. Went to hall doors began pushing on them to go out, went into psych unit bathroom and when checked on, pt. Found sitting on floor in front of toilet with pant drawstring around her neck pulling on ends.  Drawstring taken from pt. And pt. Escorted to room by [employer].  Pt. Became very loud, yelling and screaming.  EDP notifed, medication ordered, while getting medication ready pt. Was in her room trying to cut arm on room hardware on cabinet.  EDP came to pt. Room, ordered restraints and new medication ordered.  Pt. Given all medications, placed in restraints, Charge RN notified, pt. Given 1-to1 sitter at bedside.

## 2011-04-25 NOTE — ED Provider Notes (Addendum)
Sleeping, easily arousable and cooperative at present.   Pt reportedly  Cutting self yesterday ashas tried to hang self while here.  Currently under ivc awaiting placement  Doug Sou, MD 04/25/11 0744  12 noon patient combative reportedly trying to cut self or hitting at staff. On exam she is flailing all extremities crying loudly Haldol 10 mg IM ordered   Doug Sou, MD 04/25/11 1214

## 2011-04-25 NOTE — ED Notes (Signed)
While helping with restraints patient kept yelling to "please kill me. I'm the devil." [employer] came into room and patient then demanded "kill me. Shannan Harper me now." [employer] advised patient he was not going to do that. Patient then starting rolling her eyes into the back of her head and making moaning noises. Restraints were placed. Stayed with patient until RN, Victorino Dike came back with more meds. Patient then started screaming "don't let her touch me. She's raping me. This is worse than rape. You're a racist bitch. You're the KKK." Left patient with sitter and security.

## 2011-06-26 ENCOUNTER — Encounter (HOSPITAL_COMMUNITY): Payer: Self-pay | Admitting: Family Medicine

## 2011-06-26 ENCOUNTER — Emergency Department (HOSPITAL_COMMUNITY)
Admission: EM | Admit: 2011-06-26 | Discharge: 2011-06-27 | Disposition: A | Discharge status: Other Acute Inpatient Hospital | Payer: Medicare Other | Source: Home / Self Care | Attending: Emergency Medicine | Admitting: Emergency Medicine

## 2011-06-26 DIAGNOSIS — F17200 Nicotine dependence, unspecified, uncomplicated: Secondary | ICD-10-CM | POA: Insufficient documentation

## 2011-06-26 DIAGNOSIS — F209 Schizophrenia, unspecified: Secondary | ICD-10-CM | POA: Insufficient documentation

## 2011-06-26 DIAGNOSIS — T43502A Poisoning by unspecified antipsychotics and neuroleptics, intentional self-harm, initial encounter: Secondary | ICD-10-CM | POA: Insufficient documentation

## 2011-06-26 DIAGNOSIS — F319 Bipolar disorder, unspecified: Secondary | ICD-10-CM | POA: Insufficient documentation

## 2011-06-26 DIAGNOSIS — J45909 Unspecified asthma, uncomplicated: Secondary | ICD-10-CM | POA: Insufficient documentation

## 2011-06-26 DIAGNOSIS — T43501A Poisoning by unspecified antipsychotics and neuroleptics, accidental (unintentional), initial encounter: Secondary | ICD-10-CM | POA: Insufficient documentation

## 2011-06-26 DIAGNOSIS — R45851 Suicidal ideations: Secondary | ICD-10-CM | POA: Insufficient documentation

## 2011-06-26 DIAGNOSIS — T438X2A Poisoning by other psychotropic drugs, intentional self-harm, initial encounter: Secondary | ICD-10-CM | POA: Insufficient documentation

## 2011-06-26 DIAGNOSIS — T43294A Poisoning by other antidepressants, undetermined, initial encounter: Secondary | ICD-10-CM | POA: Insufficient documentation

## 2011-06-26 DIAGNOSIS — F4310 Post-traumatic stress disorder, unspecified: Secondary | ICD-10-CM | POA: Insufficient documentation

## 2011-06-26 LAB — SALICYLATE LEVEL: Salicylate Lvl: <2 mg/dL — ABNORMAL LOW (ref 2.8–20.0)

## 2011-06-26 LAB — DIFFERENTIAL
Basophils Absolute: 0.1 10*3/uL (ref 0.0–0.1)
Basophils Relative: 1 % (ref 0–1)
Monocytes Absolute: 0.4 10*3/uL (ref 0.1–1.0)
Neutro Abs: 4.9 10*3/uL (ref 1.7–7.7)
Neutrophils Relative %: 62 % (ref 43–77)

## 2011-06-26 LAB — RAPID URINE DRUG SCREEN, HOSP PERFORMED
Amphetamines: NOT DETECTED
Benzodiazepines: NOT DETECTED

## 2011-06-26 LAB — COMPREHENSIVE METABOLIC PANEL
AST: 15 U/L (ref 0–37)
Albumin: 4.4 g/dL (ref 3.5–5.2)
Chloride: 101 mEq/L (ref 96–112)
Creatinine, Ser: 0.77 mg/dL (ref 0.50–1.10)
Potassium: 3.4 mEq/L — ABNORMAL LOW (ref 3.5–5.1)
Total Bilirubin: 0.2 mg/dL — ABNORMAL LOW (ref 0.3–1.2)

## 2011-06-26 LAB — CBC
MCHC: 35.1 g/dL (ref 30.0–36.0)
Platelets: 302 10*3/uL (ref 150–400)
RDW: 12 % (ref 11.5–15.5)

## 2011-06-26 LAB — POCT PREGNANCY, URINE: Preg Test, Ur: NEGATIVE

## 2011-06-26 LAB — ETHANOL: Alcohol, Ethyl (B): 231 mg/dL — ABNORMAL HIGH (ref 0–11)

## 2011-06-26 MED ORDER — ZIPRASIDONE HCL 20 MG PO CAPS
20.0000 mg | ORAL_CAPSULE | Freq: Two times a day (BID) | ORAL | Status: DC
  Administered 2011-06-26 – 2011-06-27 (×3): 20 mg via ORAL
  Filled 2011-06-26 (×4): qty 1

## 2011-06-26 MED ORDER — LORAZEPAM 2 MG/ML IJ SOLN
2.0000 mg | Freq: Once | INTRAMUSCULAR | Status: AC
  Administered 2011-06-26: 2 mg via INTRAMUSCULAR
  Filled 2011-06-26: qty 1

## 2011-06-26 MED ORDER — RISPERIDONE 1 MG PO TABS
1.0000 mg | ORAL_TABLET | Freq: Two times a day (BID) | ORAL | Status: DC
  Administered 2011-06-26: 1 mg via ORAL
  Filled 2011-06-26 (×3): qty 1

## 2011-06-26 MED ORDER — NICOTINE 21 MG/24HR TD PT24
21.0000 mg | MEDICATED_PATCH | Freq: Every day | TRANSDERMAL | Status: DC
  Administered 2011-06-26 – 2011-06-27 (×2): 21 mg via TRANSDERMAL
  Filled 2011-06-26 (×2): qty 1

## 2011-06-26 MED ORDER — IBUPROFEN 600 MG PO TABS: 600.0000 mg | ORAL_TABLET | Freq: Three times a day (TID) | ORAL | Status: DC | PRN

## 2011-06-26 MED ORDER — ONDANSETRON HCL 4 MG PO TABS: 4.0000 mg | ORAL_TABLET | Freq: Three times a day (TID) | ORAL | Status: DC | PRN

## 2011-06-26 MED ORDER — LORAZEPAM 1 MG PO TABS
2.0000 mg | ORAL_TABLET | Freq: Once | ORAL | Status: AC
  Administered 2011-06-26: 2 mg via ORAL
  Filled 2011-06-26: qty 2

## 2011-06-26 MED ORDER — TRAZODONE HCL 50 MG PO TABS
50.0000 mg | ORAL_TABLET | Freq: Every day | ORAL | Status: DC
  Administered 2011-06-26: 50 mg via ORAL
  Filled 2011-06-26: qty 1

## 2011-06-26 MED ORDER — LORAZEPAM 1 MG PO TABS
1.0000 mg | ORAL_TABLET | Freq: Four times a day (QID) | ORAL | Status: DC | PRN
  Administered 2011-06-26 – 2011-06-27 (×3): 1 mg via ORAL
  Filled 2011-06-26 (×3): qty 1

## 2011-06-26 MED ORDER — OXCARBAZEPINE 300 MG PO TABS
600.0000 mg | ORAL_TABLET | Freq: Two times a day (BID) | ORAL | Status: DC
  Administered 2011-06-26 – 2011-06-27 (×3): 600 mg via ORAL
  Filled 2011-06-26 (×5): qty 2

## 2011-06-26 NOTE — ED Notes (Signed)
Up to the bathroom 

## 2011-06-26 NOTE — ED Notes (Addendum)
Dr Manus Gunning updated-give 20 mg geodon now TORB

## 2011-06-26 NOTE — ED Notes (Signed)
Patient ambulated to bathroom accompanied by [employer], RN and  Nurse Tech. Patient unable to urinate at this time. Patient changed into blue paper scrubs.

## 2011-06-26 NOTE — ED Notes (Signed)
Calmer, resting quietly, laying on the bed, sitter at bedside

## 2011-06-26 NOTE — ED Notes (Signed)
In room dancing/taking her top off-pt did not leave the room and put her top back on and was iinstructed not to take it off

## 2011-06-26 NOTE — ED Notes (Signed)
Sitting on the floor in her room, nad

## 2011-06-26 NOTE — ED Notes (Signed)
Pt alert, brought to ER by [employer], pt SI, per [employer], pt took several unknown pills in their presence, pt non compliant in room,  resp even unlabored, skin pwd, [employer] remains bedside, pt remains hand cuffed

## 2011-06-26 NOTE — ED Notes (Addendum)
Last phone call at this time. Telepsych consult initiated. Awaiting return call from MD

## 2011-06-26 NOTE — ED Provider Notes (Signed)
Medical screening examination/treatment/procedure(s) were performed by non-physician practitioner and as supervising physician I was immediately available for consultation/collaboration.   Robbert Langlinais A Hazen Brumett, MD 06/26/11 0730 

## 2011-06-26 NOTE — ED Notes (Signed)
Pt out in hall and attempted to grab ACT councelor in approproiately.  EDP  Aware of pt's continued behavior additional meds ordered, Charge nurse aware

## 2011-06-26 NOTE — ED Notes (Signed)
Pt up in hall, laughing, jovial, difficult to redirect back to room. Back in room dancing,  Twirling, watching tv.

## 2011-06-26 NOTE — ED Notes (Signed)
Sleeping, easily aroused, resp even and unlabored, sitter w/ pt

## 2011-06-26 NOTE — ED Notes (Signed)
Pt up to bathroom and was observed sitting on the floor cutting herself.  Pt had hidden a piece of plastic and was scrapping her arm.  Pt back to her room, plastic removed .

## 2011-06-26 NOTE — ED Notes (Signed)
Resting  quietly, watching  tv

## 2011-06-26 NOTE — ED Notes (Signed)
Pt ran after NT leaving the dept and attempted to leave.  Pt back to room instructed not to attempt it again

## 2011-06-26 NOTE — ED Notes (Signed)
Dr Denton Lank aware of pt behavior

## 2011-06-26 NOTE — ED Notes (Signed)
Up to the desk, redirected back to room w/o difficulty

## 2011-06-26 NOTE — ED Notes (Signed)
Sitting on bed eatting

## 2011-06-26 NOTE — ED Notes (Signed)
Abrasion approx 1..5" lt inner wrist cleaned w/ NS, dry dressing applied

## 2011-06-26 NOTE — ED Notes (Signed)
[id number]<AV> Expected date:<BR> Expected time:<BR> Means of arrival:<BR> Comments:<BR> Tiffany

## 2011-06-26 NOTE — ED Notes (Signed)
Calmer, but still difficulty to direct at times

## 2011-06-26 NOTE — ED Provider Notes (Signed)
History     CSN: [id number]  Arrival date & time 06/26/11  0111   First MD Initiated Contact with Patient 06/26/11 0112      Chief Complaint  Patient presents with  . Medical Clearance  . Ingestion    (Consider location/radiation/quality/duration/timing/severity/associated sxs/prior treatment) HPI Comments: Patient brought in by [facility] police on their arrival, she swallowed a bunch of pills, stating she was suicidal and wanted to die.  He also scratched the left side of her neck with her fingernails.  She has a history of self-mutilation having well-healed scars on both forearms  Patient is a 26 y.o. female presenting with Ingested Medication. The history is provided by the patient and the police.  Ingestion This is a recurrent problem. The current episode started today. Pertinent negatives include no fever. The symptoms are aggravated by drinking. She has tried nothing for the symptoms.    Past Medical History  Diagnosis Date  . Asthma   . Mental disorder PTSD Depression Schizophrenic disorder,  anxiety  . Depression   . Deliberate self-cutting     Past Surgical History  Procedure Date  . Foot surgery     Family History  Problem Relation Age of Onset  . Diabetes Other   . Depression Maternal Uncle     History  Substance Use Topics  . Smoking status: Current Everyday Smoker -- 0.5 packs/day    Types: Cigarettes  . Smokeless tobacco: Not on file  . Alcohol Use: 13.2 oz/week    12 Cans of beer, 10 Shots of liquor per week     Heavy    OB History    Grav Para Term Preterm Abortions TAB SAB Ect Mult Living                  Review of Systems  Constitutional: Negative for fever.  Psychiatric/Behavioral: Positive for suicidal ideas and self-injury. Negative for decreased concentration.    Allergies  Review of patient's allergies indicates no known allergies.  Home Medications   Current Outpatient Rx  Name Route Sig Dispense Refill  . OXCARBAZEPINE 600  MG PO TABS Oral Take 600 mg by mouth 2 (two) times daily.    Marland Kitchen RISPERIDONE 2 MG PO TABS Oral Take 2-4 mg by mouth 2 (two) times daily. Take 1 tablet in the morning Take 2 tablets at bedtime    . TRAZODONE HCL 50 MG PO TABS Oral Take 50 mg by mouth at bedtime.      Marland Kitchen ZIPRASIDONE HCL 40 MG PO CAPS Oral Take 80 mg by mouth at bedtime.      BP 115/71  Pulse 97  Temp(Src) 98.4 F (36.9 C) (Oral)  Resp 22  SpO2 98%  Physical Exam  Constitutional: She appears well-developed and well-nourished.  HENT:  Head: Normocephalic.  Eyes: Pupils are equal, round, and reactive to light.  Neck: Normal range of motion.  Cardiovascular: Normal rate.   Pulmonary/Chest: Effort normal.  Musculoskeletal: Normal range of motion.  Neurological: She is alert.  Skin: Skin is warm.       Superficial abrasions to the left side of the neck, and upper chest  Psychiatric: Her affect is angry. Her speech is not rapid and/or pressured. She is aggressive. She expresses impulsivity and inappropriate judgment. She expresses suicidal ideation. She expresses suicidal plans. She expresses no homicidal plans.    ED Course  Procedures (including critical care time)  Labs Reviewed  COMPREHENSIVE METABOLIC PANEL - Abnormal; Notable for the following:  Potassium 3.4 (*)    Total Bilirubin 0.2 (*)    All other components within normal limits  SALICYLATE LEVEL - Abnormal; Notable for the following:    Salicylate Lvl <2.0 (*)    All other components within normal limits  ETHANOL - Abnormal; Notable for the following:    Alcohol, Ethyl (B) 231 (*)    All other components within normal limits  CBC  DIFFERENTIAL  ACETAMINOPHEN LEVEL  URINE RAPID DRUG SCREEN (HOSP PERFORMED)  POCT PREGNANCY, URINE   No results found.   No diagnosis found.    MDM  Suicide attempt by taking pills, unknown amount, but they're her prescribed pills, trazodone risperidone and Zyprexa        Arman Filter, NP 06/26/11 [phone]

## 2011-06-26 NOTE — ED Notes (Signed)
Pt in room, laughing/smiling/jovial, pt declines to take risperidal, rocking while sitting on the chair, figiting.

## 2011-06-26 NOTE — ED Notes (Signed)
Sitter in w/ pt

## 2011-06-26 NOTE — ED Notes (Signed)
Up to the desk wanting to use the the phone-pt is aware that she can not use the phone until she is calmer.  Pt directed back to her room w/ diffificulty

## 2011-06-26 NOTE — ED Notes (Signed)
Pt  Observed hiding object from her diner tray in her pocket-when asked pt denied, but was guarding her shirt pocket--black cup found in pocket

## 2011-06-26 NOTE — ED Notes (Signed)
Pt standing on the bed and got down when observed.  Pt instructed not to stand on the bed and verbalized understanding.  Presently sitting on the floor in her room singing.

## 2011-06-26 NOTE — ED Notes (Signed)
Up tot he bathroom to shower and change scrubs 

## 2011-06-26 NOTE — ED Notes (Addendum)
Pt up in hall, declining to return to room, security into see.  Pt laughing, jovial, hyper, dancing in the haddl, doing cartwheels. security in to assist.  Pt made inappropriate sexual gestures towards them.  Pt assisted back to room and medicated and instructed not to leave the room.   Pt w/ a hx of using plastics/trash to cut herself.  Trash/plastics removed.  Sitter requested with charge nurse

## 2011-06-26 NOTE — ED Notes (Signed)
Up to the bathroom--sitter w/ pt

## 2011-06-27 ENCOUNTER — Encounter (HOSPITAL_COMMUNITY): Payer: Self-pay

## 2011-06-27 ENCOUNTER — Inpatient Hospital Stay (HOSPITAL_COMMUNITY)
Admission: AD | Admit: 2011-06-27 | Discharge: 2011-07-01 | DRG: 885 | Disposition: A | Discharge status: Home / Self Care | Payer: Medicare Other | Source: Ambulatory Visit | Attending: Psychiatry | Admitting: Psychiatry

## 2011-06-27 DIAGNOSIS — Z9149 Other personal history of psychological trauma, not elsewhere classified: Secondary | ICD-10-CM

## 2011-06-27 DIAGNOSIS — F603 Borderline personality disorder: Secondary | ICD-10-CM

## 2011-06-27 DIAGNOSIS — T43501A Poisoning by unspecified antipsychotics and neuroleptics, accidental (unintentional), initial encounter: Secondary | ICD-10-CM | POA: Diagnosis present

## 2011-06-27 DIAGNOSIS — T43502A Poisoning by unspecified antipsychotics and neuroleptics, intentional self-harm, initial encounter: Secondary | ICD-10-CM | POA: Diagnosis present

## 2011-06-27 DIAGNOSIS — F1210 Cannabis abuse, uncomplicated: Secondary | ICD-10-CM | POA: Diagnosis present

## 2011-06-27 DIAGNOSIS — F3113 Bipolar disorder, current episode manic without psychotic features, severe: Principal | ICD-10-CM | POA: Diagnosis present

## 2011-06-27 DIAGNOSIS — Z79899 Other long term (current) drug therapy: Secondary | ICD-10-CM

## 2011-06-27 DIAGNOSIS — F17200 Nicotine dependence, unspecified, uncomplicated: Secondary | ICD-10-CM | POA: Diagnosis present

## 2011-06-27 DIAGNOSIS — T43294A Poisoning by other antidepressants, undetermined, initial encounter: Secondary | ICD-10-CM | POA: Diagnosis present

## 2011-06-27 DIAGNOSIS — J45909 Unspecified asthma, uncomplicated: Secondary | ICD-10-CM | POA: Diagnosis present

## 2011-06-27 DIAGNOSIS — F4310 Post-traumatic stress disorder, unspecified: Secondary | ICD-10-CM

## 2011-06-27 DIAGNOSIS — F1020 Alcohol dependence, uncomplicated: Secondary | ICD-10-CM

## 2011-06-27 DIAGNOSIS — F1999 Other psychoactive substance use, unspecified with unspecified psychoactive substance-induced disorder: Secondary | ICD-10-CM

## 2011-06-27 MED ORDER — ALUM & MAG HYDROXIDE-SIMETH 200-200-20 MG/5ML PO SUSP
30.0000 mL | ORAL | Status: DC | PRN
  Administered 2011-06-30: 30 mL via ORAL

## 2011-06-27 MED ORDER — HALOPERIDOL 5 MG PO TABS
ORAL_TABLET | ORAL | Status: AC
  Filled 2011-06-27: qty 2

## 2011-06-27 MED ORDER — MAGNESIUM HYDROXIDE 400 MG/5ML PO SUSP: 30.0000 mL | Freq: Every day | ORAL | Status: DC | PRN

## 2011-06-27 MED ORDER — RISPERIDONE 2 MG PO TABS
2.0000 mg | ORAL_TABLET | ORAL | Status: DC
  Administered 2011-06-28 – 2011-06-29 (×2): 2 mg via ORAL
  Filled 2011-06-27 (×5): qty 1

## 2011-06-27 MED ORDER — ACETAMINOPHEN 325 MG PO TABS: 650.0000 mg | ORAL_TABLET | Freq: Four times a day (QID) | ORAL | Status: DC | PRN

## 2011-06-27 MED ORDER — RISPERIDONE 2 MG PO TABS: 2.0000 mg | ORAL_TABLET | ORAL | Status: DC

## 2011-06-27 MED ORDER — LORAZEPAM 2 MG/ML IJ SOLN: 2.0000 mg | INTRAMUSCULAR | Status: AC

## 2011-06-27 MED ORDER — HALOPERIDOL 5 MG PO TABS
10.0000 mg | ORAL_TABLET | ORAL | Status: AC
  Filled 2011-06-27: qty 2
  Filled 2011-06-27 (×2): qty 1

## 2011-06-27 MED ORDER — OXCARBAZEPINE 300 MG PO TABS
600.0000 mg | ORAL_TABLET | ORAL | Status: DC
  Administered 2011-06-27 – 2011-06-28 (×2): 600 mg via ORAL
  Filled 2011-06-27 (×7): qty 2

## 2011-06-27 MED ORDER — LORAZEPAM 1 MG PO TABS
2.0000 mg | ORAL_TABLET | ORAL | Status: AC
  Filled 2011-06-27: qty 2

## 2011-06-27 MED ORDER — HALOPERIDOL LACTATE 5 MG/ML IJ SOLN
10.0000 mg | INTRAMUSCULAR | Status: AC
  Filled 2011-06-27: qty 2

## 2011-06-27 MED ORDER — LORAZEPAM 1 MG PO TABS
ORAL_TABLET | ORAL | Status: AC
  Filled 2011-06-27: qty 2

## 2011-06-27 NOTE — ED Notes (Signed)
Call to Charge Nurse requesting [employer]

## 2011-06-27 NOTE — ED Notes (Signed)
Patient seen thru monitor tearing little brown pieces of something to cut self, security and staff went to room and took brown pieces of plastic out off the floor, next she was seen reaching into her pocket and pulling out more plastic, bed taken out of the room, mattress placed on floor for patient,chair removed from room, patient then got up and went to BR where she was seen on monitor pulling something out of her pocket, police and security went into BR w/staff to get the remaining parts of the brown cup from her tray, secured the remaining parts, patient back in room standing against the wall.
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
## 2011-06-27 NOTE — ED Notes (Signed)
Watching patient on monitor saw her left leg in motion as well as her left arm in motion,  chewing motions were also noted to go w/leg and arm motion. Went to room w/security and [employer], patient had plastic in her mouth using it to cut on her right arm, plastic secured. There will be NO MORE plastic given to patient, she will only be able to have her cup and plate. Nothing else will be given to her to use while she is here.

## 2011-06-27 NOTE — Tx Team (Signed)
Initial Interdisciplinary Treatment Plan  PATIENT STRENGTHS: (choose at least two) Active sense of humor Capable of independent living Physical Health  PATIENT STRESSORS: Medication change or noncompliance   PROBLEM LIST: Problem List/Patient Goals Date to be addressed Date deferred Reason deferred Estimated date of resolution  Psychosis 06/27/11   D/C  SI 06/27/11   D/C                                             DISCHARGE CRITERIA:  Ability to meet basic life and health needs Improved stabilization in mood, thinking, and/or behavior Motivation to continue treatment in a less acute level of care Verbal commitment to aftercare and medication compliance  PRELIMINARY DISCHARGE PLAN: Return to previous living arrangement  PATIENT/FAMIILY INVOLVEMENT: This treatment plan has been presented to and reviewed with the patient, Nichole Lopez.  The patient and family have been given the opportunity to ask questions and make suggestions.  [name] 06/27/2011, 8:00 PM

## 2011-06-27 NOTE — Progress Notes (Signed)
Patient arrived accompanies by [employer]. They brought a plastic bag with her belongings. Patient unable to go to search room. Danny - security guard and I opened bag to document belongings. There were a few clothing items, medication bottle, various body piercings and a pink silicone phone case that was empty. Placed in locker 23.

## 2011-06-28 DIAGNOSIS — F3113 Bipolar disorder, current episode manic without psychotic features, severe: Principal | ICD-10-CM

## 2011-06-28 DIAGNOSIS — F603 Borderline personality disorder: Secondary | ICD-10-CM

## 2011-06-28 DIAGNOSIS — F1020 Alcohol dependence, uncomplicated: Secondary | ICD-10-CM

## 2011-06-28 MED ORDER — DIVALPROEX SODIUM ER 250 MG PO TB24
750.0000 mg | ORAL_TABLET | Freq: Every day | ORAL | Status: DC
  Administered 2011-06-28 – 2011-06-29 (×2): 750 mg via ORAL
  Filled 2011-06-28 (×5): qty 1

## 2011-06-28 MED ORDER — LORAZEPAM 2 MG/ML IJ SOLN: 2.0000 mg | Freq: Four times a day (QID) | INTRAMUSCULAR | Status: DC | PRN

## 2011-06-28 MED ORDER — NICOTINE 21 MG/24HR TD PT24
21.0000 mg | MEDICATED_PATCH | Freq: Every day | TRANSDERMAL | Status: DC
  Administered 2011-06-29 – 2011-07-01 (×3): 21 mg via TRANSDERMAL
  Filled 2011-06-28 (×6): qty 1

## 2011-06-28 MED ORDER — NICOTINE 21 MG/24HR TD PT24
MEDICATED_PATCH | TRANSDERMAL | Status: AC
  Administered 2011-06-28: 09:00:00
  Filled 2011-06-28: qty 1

## 2011-06-28 MED ORDER — HALOPERIDOL 5 MG PO TABS
10.0000 mg | ORAL_TABLET | Freq: Four times a day (QID) | ORAL | Status: DC | PRN
  Administered 2011-06-28 (×2): 5 mg via ORAL

## 2011-06-28 MED ORDER — HALOPERIDOL LACTATE 5 MG/ML IJ SOLN: 10.0000 mg | Freq: Four times a day (QID) | INTRAMUSCULAR | Status: DC | PRN

## 2011-06-28 MED ORDER — LORAZEPAM 1 MG PO TABS
2.0000 mg | ORAL_TABLET | Freq: Four times a day (QID) | ORAL | Status: DC | PRN
  Administered 2011-06-28 – 2011-06-29 (×3): 2 mg via ORAL
  Filled 2011-06-28 (×2): qty 2

## 2011-06-28 MED ORDER — POTASSIUM CHLORIDE CRYS ER 20 MEQ PO TBCR
20.0000 meq | EXTENDED_RELEASE_TABLET | Freq: Two times a day (BID) | ORAL | Status: AC
  Administered 2011-06-28 – 2011-07-01 (×6): 20 meq via ORAL
  Filled 2011-06-28 (×6): qty 1
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
## 2011-06-28 NOTE — CIRT (Signed)
Post Seclusion/Restraint Episode Mini Treatment Team  Date of Seclusion or Restraint Episode: 06/27/2011 Today's Date:  06/28/2011  List of Patient Triggers/Skill Deficits: Pt. Began drinking alcohol at home prior to admission. She has been non-compliant on meds for an undisclosed amount of time. These behaviors tend to lead to manic symptoms; impulsivity, hypersexuality.  Pt. Lacks sufficient coping skills to deal with disease management and general life stressors.  Review of Medications:  Current Facility-Administered Medications  Medication Dose Route Frequency Provider Last Rate Last Dose  . acetaminophen (TYLENOL) tablet 650 mg  650 mg Oral Q6H PRN Curlene Labrum Readling, MD      . alum & mag hydroxide-simeth (MAALOX/MYLANTA) 200-200-20 MG/5ML suspension 30 mL  30 mL Oral Q4H PRN Curlene Labrum Readling, MD      . haloperidol (HALDOL) 5 MG tablet           . haloperidol (HALDOL) tablet 10 mg  10 mg Oral NOW Curlene Labrum Readling, MD       Or  . haloperidol lactate (HALDOL) injection 10 mg  10 mg Intramuscular NOW Curlene Labrum Readling, MD      . haloperidol (HALDOL) tablet 10 mg  10 mg Oral Q6H PRN Curlene Labrum Readling, MD   5 mg at 06/28/11 1319   Or  . haloperidol lactate (HALDOL) injection 10 mg  10 mg Intramuscular Q6H PRN Curlene Labrum Readling, MD      . LORazepam (ATIVAN) 1 MG tablet           . LORazepam (ATIVAN) tablet 2 mg  2 mg Oral NOW Ronny Bacon, MD       Or  . LORazepam (ATIVAN) injection 2 mg  2 mg Intramuscular NOW Curlene Labrum Readling, MD      . LORazepam (ATIVAN) tablet 2 mg  2 mg Oral Q6H PRN Curlene Labrum Readling, MD   2 mg at 06/28/11 1000   Or  . LORazepam (ATIVAN) injection 2 mg  2 mg Intramuscular Q6H PRN Curlene Labrum Readling, MD      . magnesium hydroxide (MILK OF MAGNESIA) suspension 30 mL  30 mL Oral Daily PRN Curlene Labrum Readling, MD      . nicotine (NICODERM CQ - dosed in mg/24 hours) 21 mg/24hr patch           . nicotine (NICODERM CQ - dosed in mg/24 hours) patch 21 mg  21 mg Transdermal Daily  Curlene Labrum Readling, MD      . Oxcarbazepine (TRILEPTAL) tablet 600 mg  600 mg Oral BH-qamhs Curlene Labrum Readling, MD   600 mg at 06/28/11 0759  . potassium chloride SA (K-DUR,KLOR-CON) CR tablet 20 mEq  20 mEq Oral BID Sanjuana Kava, NP      . risperiDONE (RISPERDAL) tablet 2 mg  2 mg Oral BH-qamhs Randy D Readling, MD      . DISCONTD: risperiDONE (RISPERDAL) tablet 2 mg  2 mg Oral BH-qamhs Ronny Bacon, MD       Facility-Administered Medications Ordered in Other Encounters  Medication Dose Route Frequency Provider Last Rate Last Dose  . DISCONTD: ibuprofen (ADVIL,MOTRIN) tablet 600 mg  600 mg Oral Q8H PRN Arman Filter, NP      . DISCONTD: LORazepam (ATIVAN) tablet 1 mg  1 mg Oral Q6H PRN [name], MD   1 mg at 06/27/11 1404  . DISCONTD: nicotine (NICODERM CQ - dosed in mg/24 hours) patch 21 mg  21 mg Transdermal Daily Arman Filter, NP  21 mg at 06/27/11 0932  . DISCONTD: ondansetron (ZOFRAN) tablet 4 mg  4 mg Oral Q8H PRN Arman Filter, NP      . DISCONTD: Oxcarbazepine (TRILEPTAL) tablet 600 mg  600 mg Oral BID Cheri Guppy, MD   600 mg at 06/27/11 0925  . DISCONTD: risperiDONE (RISPERDAL) tablet 1 mg  1 mg Oral BID Cheri Guppy, MD   1 mg at 06/26/11 2219  . DISCONTD: traZODone (DESYREL) tablet 50 mg  50 mg Oral QHS Cheri Guppy, MD   50 mg at 06/26/11 2219  . DISCONTD: ziprasidone (GEODON) capsule 20 mg  20 mg Oral BID WC Cheri Guppy, MD   20 mg at 06/27/11 1655    Compliant with Medications:  Yes, while in the hospital. Pt requested Geodon, but since it wasn't ordered pt is taking haldol instead.  Need for Medication Adjustment:  No  Plan to Prevent Future Episodes of Seclusion and Restraint: Pt is to remain on 1:1 observation and encouraged to remain med compliant.   Staff Present:  Physician   Nurse Practitioner/PA   Pharmacist   Nurse Thurman Coyer, RN, [facility]  Nurse   Nurse   MHT/NT   Counselor/Case Manager   Department Leadership        Francene Boyers 06/28/2011, 3:32 PM

## 2011-06-28 NOTE — Discharge Planning (Signed)
Met with patient in Aftercare Planning Group.   Just before group she was lying down in the hall, squirming around on the floor.  Case Manager asked her to get up, saying the floor was dirty, and she was reluctant to do so but did comply.  During group she rocked vigorously back and forth and said it felt good.  She stated she lives alone and gets substance abuse treatment through groups on Tuesdays and Wednesdays at [facility] the [place] with Sharen Hint.  She stated that she gets her medication management at [facility].  When she is ready to go home, she stated she assumes that she will be transported by the "po-po" (police) or will go by bus.  Per State Regulation 482.30  This chart was reviewed for medical necessity with respect to the patient's Admission/Duration of stay.   Next review due:  07/01/11   Ambrose Mantle, LCSW  06/28/2011  1:28 PM

## 2011-06-28 NOTE — Tx Team (Signed)
PATIENT WAS SLEEPING AFTER BEING GIVEN MEDICATION DUE TO ESCALATION.  THUS HER VISIT WITH TX TM WAS DELAYED UNTIL 06/29/11.   Ambrose Mantle, LCSW 06/28/2011, 11:31 AM    Interdisciplinary Treatment Plan Update (Adult)  Date:  06/28/2011  Time Reviewed:  10:15AM-11:15AM  Progress in Treatment: Attending groups:   Participating in groups:     Taking medication as prescribed:     Tolerating medication:    Family/Significant other contact made:   Patient understands diagnosis:    Discussing patient identified problems/goals with staff:    Medical problems stabilized or resolved:    Denies suicidal/homicidal ideation:   Issues/concerns per patient self-inventory:    Other:    New problem(s) identified:  Agitation, danger of elopement  Reason for Continuation of Hospitalization: Psychosis   Interventions implemented related to continuation of hospitalization:  Medication monitoring and adjustment, safety checks Q15 min., suicide risk assessment, group therapy, psychoeducation, collateral contact, aftercare planning, ongoing physician assessments, medication education  Additional comments:  Not applicable  Estimated length of stay:  5-7 days  Discharge Plan:  Unknown at this time  New goal(s):  Not applicable  Review of initial/current patient goals per problem list:   1.  Goal(s):  Deny SI for 48 hours prior to D/C.  Met:  No  Target date:  By Discharge   As evidenced by:    2.  Goal(s):  Reduce psychotic symptoms to baseline per patient and family.  Met:  No  Target date:  By Discharge   As evidenced by:    3.  Goal(s):  Determine if & how to address substance abuse issues.  Met:  No  Target date:  By Discharge   As evidenced by:    4.  Goal(s):  Determine where patient is in process of getting set up with ACT Team.  Met:  No  Target date:  By Discharge   As evidenced by:    5.  Goal(s):  Have no cutting behaviors, be able to be safe off 1:1 for  24 hours prior to D/C.  Met:  No  Target date:  By Discharge   As evidenced by:  Attendees: Patient:  Nichole Lopez  06/28/2011 10:15AM-11:15AM  Family:     Physician:  Dr. Harvie Heck Readling 06/28/2011 10:15AM-11:15AM  Nursing:   Waynetta Sandy, RN 06/28/2011 10:15AM -11:15AM   Case Manager:  Ambrose Mantle, LCSW 06/28/2011 10:15AM-11:15AM  Counselor:  Veto Kemps, MT-BC 06/28/2011 10:15AM-11:15AM  Other:   Verne Spurr, PA 06/28/2011 10:15AM-11:15AM  Other:      Other:      Other:       Scribe for Treatment Team:   Sarina Ser, 06/28/2011, 10:15AM-11:15AM

## 2011-06-28 NOTE — BHH Suicide Risk Assessment (Signed)
Suicide Risk Assessment  Admission Assessment     Demographic factors:  Assessment Details Time of Assessment: Admission Information Obtained From: Patient Current Mental Status:    Loss Factors:    Historical Factors:  Historical Factors: Impulsivity Risk Reduction Factors:  Risk Reduction Factors: Sense of responsibility to family  CLINICAL FACTORS:   Alcohol/Substance Abuse/Dependencies Personality Disorders:   Cluster B Comorbid alcohol abuse/dependence More than one psychiatric diagnosis Previous Psychiatric Diagnoses and Treatments Bipolar I Disorder - Most Recent Episode - Manic  COGNITIVE FEATURES THAT CONTRIBUTE TO RISK:  Loss of executive function    Diagnosis:  Axis I:   Bipolar I Disorder - Most Recent Episode - Manic.   Alcohol Dependence - Continuous. Axis II:  Borderline Personality Disorder.    The patient was seen today and reports the following:   ADL's: Intact.  Sleep: The patient reports to having significant difficulty initiating and maintaining sleep.  Appetite: The patient reports a good appetite today.   Mild>(1-10) >Severe  Hopelessness (1-10): (Unable to assess)  Depression (1-10): (Unable to assess)  Anxiety (1-10):  (Unable to assess)   Suicidal Ideation: The patient denies any suicidal ideations today. Plan: No  Intent: No  Means: No  Homicidal Ideation: The patient denies any homicidal ideations today.  Plan: No  Intent: No.  Means: No   General Appearance/Behavior: The patient was significantly manic and inappropriate this morning.  She was essentially uncooperative.  Eye Contact: Fair.  Speech: Severely pressured today. Motor Behavior: Moderate agitation.  Level of Consciousness: Alert and Oriented x 3.  Mental Status: Alert and Oriented x 3.  Mood: Severely manic.  Affect: Severely expansive.  Anxiety Level: (Unable to assess)  Thought Process: Disorganized and flight of ideas.  Thought Content: The patient denies any  auditory or visual hallucinations today. She also denied any paranoid delusions.  Perception: Paranoid thinking remains.  Judgment: Poor.  Insight: Poor.  Cognition: Oriented to person, place and time.   Lab Results:  No results found for this or any previous visit (from the past 48 hour(s)).   Current Medications: . divalproex  750 mg Oral Q2200  . haloperidol      . haloperidol  10 mg Oral NOW   Or  . haloperidol lactate  10 mg Intramuscular NOW  . LORazepam      . LORazepam  2 mg Oral NOW   Or  . LORazepam  2 mg Intramuscular NOW  . nicotine      . nicotine  21 mg Transdermal Daily  . potassium chloride  20 mEq Oral BID  . risperiDONE  2 mg Oral BH-qamhs  . DISCONTD: oxcarbazepine  600 mg Oral BH-qamhs  . DISCONTD: risperiDONE  2 mg Oral BH-qamhs   Time was spent today attempting to discuss with the patient her current symptoms. The patient was severely manic and inappropriate with this provider and repeatedly attempted to touch and hug staff.  Her speech was rapid and pressures with significant flight of ideas.  She denied any auditory or visual hallucinations as well as any suicidal or homicidal ideations.  No additional useful information could be obtained from the patient.  Treatment Plan Summary:  1. Daily contact with patient to assess and evaluate symptoms and progress in treatment  2. Medication management  3. The patient will deny suicidal ideations or homicidal ideations for 48 hours prior to discharge and have a depression and anxiety rating of 3 or less. The patient will also deny any auditory or  visual hallucinations or delusional thinking.  4. The patient will deny any symptoms of substance withdrawal at time of discharge.   Plan:  1. The patient was started on Risperdal at 2 mgs po q am and hs for agitation and mood stability.  This medication was chosen since it is listed as a home medication for the patient. 2. The medication Trileptal was discontinued. 3. The  patient was started on the medication Depakote ER at 750 mgs po qhs for mood stabilization and aggression. 4. The patient was started on KCL 20 mEq po BID x 6 doses for hypokalemia. 5. Will also order Haldol 10 mgs po or IM and Ativan 2 mg po or IM q 6 hours - prn for agitation. 6. Will continue to monitor.  7. Laboratory studies reviewed.  8. Will continue to monitor.  SUICIDE RISK:   Moderate:  Frequent suicidal ideation with limited intensity, and duration, some specificity in terms of plans, no associated intent, good self-control, limited dysphoria/symptomatology, some risk factors present, and identifiable protective factors, including available and accessible social support.  Nichole Lopez 06/28/2011, 5:22 PM

## 2011-06-28 NOTE — Progress Notes (Signed)
Patient ID: Nichole Lopez, female   DOB: 07-26-1985, 26 y.o.   MRN: [id number] 1:1 note. Patient resting quietly with eyes closed. HCT monitoring patient at all times. Patient remains safe.
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
## 2011-06-29 LAB — COMPREHENSIVE METABOLIC PANEL
AST: 12 U/L (ref 0–37)
Albumin: 3.6 g/dL (ref 3.5–5.2)
CO2: 23 mEq/L (ref 19–32)
Calcium: 8.9 mg/dL (ref 8.4–10.5)
Creatinine, Ser: 0.88 mg/dL (ref 0.50–1.10)
GFR calc non Af Amer: >90 mL/min (ref >90)
Sodium: 137 mEq/L (ref 135–145)
Total Protein: 6.4 g/dL (ref 6.0–8.3)

## 2011-06-29 LAB — DIFFERENTIAL
Eosinophils Relative: 1 % (ref 0–5)
Lymphocytes Relative: 37 % (ref 12–46)
Lymphs Abs: 2.6 10*3/uL (ref 0.7–4.0)
Monocytes Relative: 9 % (ref 3–12)

## 2011-06-29 LAB — CBC
Hemoglobin: 13.2 g/dL (ref 12.0–15.0)
MCV: 89.2 fL (ref 78.0–100.0)
Platelets: 271 10*3/uL (ref 150–400)
RBC: 4.24 MIL/uL (ref 3.87–5.11)
WBC: 7 10*3/uL (ref 4.0–10.5)

## 2011-06-29 MED ORDER — TOPIRAMATE 25 MG PO TABS
25.0000 mg | ORAL_TABLET | Freq: Every day | ORAL | Status: DC
  Administered 2011-06-29 – 2011-06-30 (×2): 25 mg via ORAL
  Filled 2011-06-29: qty 1
  Filled 2011-06-29: qty 14
  Filled 2011-06-29 (×3): qty 1

## 2011-06-29 MED ORDER — TRAZODONE HCL 100 MG PO TABS
100.0000 mg | ORAL_TABLET | Freq: Every day | ORAL | Status: DC
  Administered 2011-06-29 – 2011-06-30 (×2): 100 mg via ORAL
  Filled 2011-06-29 (×3): qty 1
  Filled 2011-06-29: qty 14

## 2011-06-29 MED ORDER — ZIPRASIDONE HCL 80 MG PO CAPS
40.0000 mg | ORAL_CAPSULE | Freq: Two times a day (BID) | ORAL | Status: DC
  Administered 2011-06-29 – 2011-07-01 (×5): 40 mg via ORAL
  Filled 2011-06-29 (×2): qty 1
  Filled 2011-06-29: qty 28
  Filled 2011-06-29: qty 1
  Filled 2011-06-29: qty 28
  Filled 2011-06-29 (×4): qty 1

## 2011-06-29 NOTE — Progress Notes (Signed)
1:1 OBS (1000) Nsg Note: Pt took meds without complaint this am. Pt following directions, eating well. Pt denies SI/HI. Pt does not seem as intrusive this morning as reported from yesterday. No hypersexual behavior noted. Smiling, silly not going to groups yet.  N.Dianne Bady RN
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
## 2011-06-29 NOTE — CIRT (Deleted)
Post Seclusion/Restraint Episode Mini Treatment Team  Date of Seclusion or Restraint Episode:  06/27/2011 Today's Date:  06/29/2011  List of Patient Triggers/Skill Deficits:  Review of Medications:  Current Facility-Administered Medications  Medication Dose Route Frequency Provider Last Rate Last Dose  . acetaminophen (TYLENOL) tablet 650 mg  650 mg Oral Q6H PRN Curlene Labrum Readling, MD      . alum & mag hydroxide-simeth (MAALOX/MYLANTA) 200-200-20 MG/5ML suspension 30 mL  30 mL Oral Q4H PRN Curlene Labrum Readling, MD      . divalproex (DEPAKOTE ER) 24 hr tablet 750 mg  750 mg Oral Q2200 Curlene Labrum Readling, MD   750 mg at 06/28/11 2207  . haloperidol (HALDOL) tablet 10 mg  10 mg Oral NOW Curlene Labrum Readling, MD       Or  . haloperidol lactate (HALDOL) injection 10 mg  10 mg Intramuscular NOW Curlene Labrum Readling, MD      . haloperidol (HALDOL) tablet 10 mg  10 mg Oral Q6H PRN Curlene Labrum Readling, MD   5 mg at 06/28/11 1319   Or  . haloperidol lactate (HALDOL) injection 10 mg  10 mg Intramuscular Q6H PRN Curlene Labrum Readling, MD      . LORazepam (ATIVAN) tablet 2 mg  2 mg Oral NOW Ronny Bacon, MD       Or  . LORazepam (ATIVAN) injection 2 mg  2 mg Intramuscular NOW Curlene Labrum Readling, MD      . LORazepam (ATIVAN) tablet 2 mg  2 mg Oral Q6H PRN Curlene Labrum Readling, MD   2 mg at 06/28/11 1620   Or  . LORazepam (ATIVAN) injection 2 mg  2 mg Intramuscular Q6H PRN Curlene Labrum Readling, MD      . magnesium hydroxide (MILK OF MAGNESIA) suspension 30 mL  30 mL Oral Daily PRN Curlene Labrum Readling, MD      . nicotine (NICODERM CQ - dosed in mg/24 hours) patch 21 mg  21 mg Transdermal Daily Curlene Labrum Readling, MD   21 mg at 06/29/11 0930  . potassium chloride SA (K-DUR,KLOR-CON) CR tablet 20 mEq  20 mEq Oral BID Sanjuana Kava, NP   20 mEq at 06/29/11 0930  . risperiDONE (RISPERDAL) tablet 2 mg  2 mg Oral BH-qamhs Curlene Labrum Readling, MD   2 mg at 06/29/11 0929  . DISCONTD: Oxcarbazepine (TRILEPTAL) tablet 600 mg  600 mg Oral BH-qamhs  Curlene Labrum Readling, MD   600 mg at 06/28/11 0759    Compliant with Medications:  Yes  Need for Medication Adjustment:  Yes  Plan to Prevent Future Episodes of Seclusion and Restraint:   Staff Present:  Physician   Nurse Practitioner/PA   Pharmacist   Nurse   Nurse   Nurse   MHT/NT   Counselor/Case Manager   Department Leadership        Francene Boyers 06/29/2011, 11:30 AM

## 2011-06-29 NOTE — Progress Notes (Signed)
Patient went outside with staff and peers at he beginning of the shift. Few minutes after they left, she was accompanied back to the unit because tried to elope per report. Patient appeared very angry on arrival, coursing staff and very rude. Writer and some MHT tried to calm patient down. It appeared patient felt she was free to walk back to the unit by herself, she said she did not like to be outside and she wanted to come back to the unit; "the lady that was with me said I couldn't, why couldn't I if I didn't like it out there". Pt became calm and indirectly apologized to the [facility], since she was very rude to him earlier and calling him names. Patient requested for medication to help her calm down, PRN Ativan 2 mg given. She received the Ativan and she is now in day room while one of the staff helping her braid her hair.

## 2011-06-29 NOTE — Progress Notes (Signed)
   Nichole Lopez attempted to elope today during the outside time and had to be brought back to the hospital. She is argumentative and impulsive, difficult to redirect.  The 1:1 will be reinstated to ensure her safety on the unit. Rona Ravens. Trevyn Lumpkin PAC

## 2011-06-29 NOTE — Tx Team (Signed)
Interdisciplinary Treatment Plan Update (Adult)  Date:  06/29/2011  Time Reviewed:  10:15AM-11:15AM  Progress in Treatment: Attending groups:  No Participating in groups:    No Taking medication as prescribed:    Yes Tolerating medication:   Yes Family/Significant other contact made:  Awaiting call back from sister Patient understands diagnosis:   Yes Discussing patient identified problems/goals with staff:   Yes Medical problems stabilized or resolved:   Yes Denies suicidal/homicidal ideation:  Yes Issues/concerns per patient self-inventory:   None Other:    New problem(s) identified: No, Describe:    Reason for Continuation of Hospitalization: Medication stabilization Other; describe bizarre behaviors, sedation on medication  Interventions implemented related to continuation of hospitalization:  Medication monitoring and adjustment, safety checks Q15 min., suicide risk assessment, group therapy, psychoeducation, collateral contact, aftercare planning, ongoing physician assessments, medication education  Additional comments:  Not applicable  Estimated length of stay:  2-4 days  Discharge Plan:  Return to apartment, follow up at [facility] [place] [facility] [place]  New goal(s):  Not applicable  Review of initial/current patient goals per problem list:   1.  Goal(s):  Deny SI for 48 hours prior to D/C.  Met:  Yes  Target date:  By Discharge   As evidenced by:  Has been denying  2.  Goal(s):  Reduce psychotic symptoms to baseline per patient and family.  Met:  No  Target date:  By Discharge   As evidenced by:  Still bizarre behaviors  3.  Goal(s):  Determine if & how to address substance abuse issues.  Met:  Yes  Target date:  By Discharge   As evidenced by:  Through counselor at [facility] and 2 groups weekly there  4.  Goal(s):  Determine where patient is in process of getting set up with ACT Team.  Met:  No  Target date:  By Discharge   As evidenced by:   No progress finding this information  5. Goal(s): Have no cutting behaviors, be able to be safe off 1:1 for 24 hours prior to D/C.  Met: Yes Target date: By Discharge  As evidenced by:Is off 1:1, needs 24 hours   Attendees: Patient:  Nichole Lopez  06/29/2011 10:15AM-11:15AM  Family:     Physician:  Dr. Harvie Heck Readling 06/29/2011 10:15AM-11:15AM  Nursing:   Edwyna Shell, RN 06/29/2011 10:15AM -11:15AM   Case Manager:  Ambrose Mantle, LCSW 06/29/2011 10:15AM-11:15AM  Counselor:  Veto Kemps, MT-BC 06/29/2011 10:15AM-11:15AM  Other:   Verne Spurr, PA 06/29/2011 10:15AM-11:15AM  Other:   Reyes Ivan, LCSWA 06/29/2011 10:15AM-11:15AM  Other:      Other:       Scribe for Treatment Team:   Sarina Ser, 06/29/2011, 10:15AM-11:15AM

## 2011-06-29 NOTE — Discharge Planning (Addendum)
Did not see patient today other than in passing and in Tx Tm, but she related to Case Manager that she has no case management needs today.  Follow-up appointment made at [facility] on Tuesday 07/05/11 at 9:00am with Dr. Eulah Pont at the [facility].  She will be going as a new patient there.  Ambrose Mantle, LCSW 06/29/2011, 3:45 PM

## 2011-06-29 NOTE — Progress Notes (Signed)
[facility] MD Progress Note  06/29/2011 2:04 PM  Diagnosis:  Axis I: Bipolar I Disorder - Most Recent Episode - Manic.  Alcohol Dependence - Continuous.  Axis II: Borderline Personality Disorder.   The patient was seen today and reports the following:   ADL's: Intact.  Sleep: The patient reports to sleeping reasonably well last night.  Appetite: The patient reports a good appetite today.   Mild>(1-10) >Severe  Hopelessness (1-10): 0  Depression (1-10): 0 Anxiety (1-10): 0   Suicidal Ideation: The patient denies any suicidal ideations today.  Plan: No  Intent: No  Means: No  Homicidal Ideation: The patient denies any homicidal ideations today.  Plan: No  Intent: No.  Means: No   General Appearance/Behavior: The patient was slightly sedated but much more cooperative today with this provider. Eye Contact: Fair to Good.  Speech: Appropriate in rate and volume with no pressuring noted.  Motor Behavior: Slightly slowed.  Level of Consciousness: Slightly sedated but oriented x 3.  Mental Status: Slightly sedated but oriented x 3.  Mood: Slightly depressed.  Affect: Mildly constricted.  Anxiety Level: No anxiety reported. Thought Process: wnl.  Thought Content: The patient denies any auditory or visual hallucinations today. She also denies any paranoid delusions.  Perception: wnl.  Judgment: Fair.  Insight: Fair.  Cognition: Oriented to person, place and time.  Sleep:  Number of Hours: 6.75    Vital Signs:Blood pressure 114/85, pulse 87, temperature 98.2 F (36.8 C), temperature source Oral, resp. rate 18, last menstrual period 06/08/2011.  Current Medications: Current Facility-Administered Medications  Medication Dose Route Frequency Provider Last Rate Last Dose  . acetaminophen (TYLENOL) tablet 650 mg  650 mg Oral Q6H PRN Curlene Labrum Meka Lewan, MD      . alum & mag hydroxide-simeth (MAALOX/MYLANTA) 200-200-20 MG/5ML suspension 30 mL  30 mL Oral Q4H PRN Curlene Labrum Kylon Philbrook, MD      .  divalproex (DEPAKOTE ER) 24 hr tablet 750 mg  750 mg Oral Q2200 Curlene Labrum Nicholis Stepanek, MD   750 mg at 06/28/11 2207  . haloperidol (HALDOL) tablet 10 mg  10 mg Oral NOW Curlene Labrum Jaleil Renwick, MD       Or  . haloperidol lactate (HALDOL) injection 10 mg  10 mg Intramuscular NOW Curlene Labrum Anaiza Behrens, MD      . haloperidol (HALDOL) tablet 10 mg  10 mg Oral Q6H PRN Curlene Labrum Tom Macpherson, MD   5 mg at 06/28/11 1319   Or  . haloperidol lactate (HALDOL) injection 10 mg  10 mg Intramuscular Q6H PRN Curlene Labrum Kendahl Bumgardner, MD      . LORazepam (ATIVAN) tablet 2 mg  2 mg Oral NOW Ronny Bacon, MD       Or  . LORazepam (ATIVAN) injection 2 mg  2 mg Intramuscular NOW Curlene Labrum Mercedees Convery, MD      . LORazepam (ATIVAN) tablet 2 mg  2 mg Oral Q6H PRN Curlene Labrum Alioune Hodgkin, MD   2 mg at 06/28/11 1620   Or  . LORazepam (ATIVAN) injection 2 mg  2 mg Intramuscular Q6H PRN Curlene Labrum Raphael Fitzpatrick, MD      . magnesium hydroxide (MILK OF MAGNESIA) suspension 30 mL  30 mL Oral Daily PRN Curlene Labrum Merline Perkin, MD      . nicotine (NICODERM CQ - dosed in mg/24 hours) patch 21 mg  21 mg Transdermal Daily Curlene Labrum Arvella Massingale, MD   21 mg at 06/29/11 0930  . potassium chloride SA (K-DUR,KLOR-CON) CR tablet 20 mEq  20 mEq Oral BID Sanjuana Kava, NP   20 mEq at 06/29/11 0930  . topiramate (TOPAMAX) tablet 25 mg  25 mg Oral Q2200 Curlene Labrum Hiroto Saltzman, MD      . traZODone (DESYREL) tablet 100 mg  100 mg Oral QHS Dashanti Burr D Klohe Lovering, MD      . ziprasidone (GEODON) capsule 40 mg  40 mg Oral BID WC Curlene Labrum Maekayla Giorgio, MD      . DISCONTD: Oxcarbazepine (TRILEPTAL) tablet 600 mg  600 mg Oral BH-qamhs Curlene Labrum Antwon Rochin, MD   600 mg at 06/28/11 0759  . DISCONTD: risperiDONE (RISPERDAL) tablet 2 mg  2 mg Oral BH-qamhs Curlene Labrum Lylian Sanagustin, MD   2 mg at 06/29/11 1610   Lab Results: No results found for this or any previous visit (from the past 48 hour(s)).  Time was spent today discussing with the patient her current symptoms.  The patient states that she is sleeping reasonably well and reports  a good appetite.  The patient reports some mild depressive symptoms but denies any suicidal or homicidal ideations today.  She also denies any significant anxiety symptoms as well as denies any auditory or visual hallucinations or delusional thinking.  The patient states that she takes Geodon as an outpatient prescribed by Dr. Ladona Ridgel at [facility] and also takes Trazodone and Topamax.  She asked that these medications be restarted today stating "I felt good on these medications."  Treatment Plan Summary:  1. Daily contact with patient to assess and evaluate symptoms and progress in treatment  2. Medication management  3. The patient will deny suicidal ideations or homicidal ideations for 48 hours prior to discharge and have a depression and anxiety rating of 3 or less. The patient will also deny any auditory or visual hallucinations or delusional thinking.  4. The patient will deny any symptoms of substance withdrawal at time of discharge.   Plan:  1. The patient was started on the medication Geodon at 40 mgs po BID-WC today at her request for mood stabilization.  This medication was started since the patient states "I feel best" on this medication. 2. The medication Risperdal was discontinued.  3. The patient was continued on the medication Depakote ER at 750 mgs po qhs for mood stabilization and aggression.  4. The patient was started on the medication Topamax 25 mgs po qhs for appetite control and further mood stabilization since the patient states she takes this medication as an outpatient. 5. The patient was started on the medication Trazodone at 100 mgs po qhs for sleep since the patient states she takes this medication as an outpatient. 6. The patient was continue KCL 20 mEq po BID x 6 doses for hypokalemia.  7. Will also order Haldol 10 mgs po or IM and Ativan 2 mg po or IM q 6 hours - prn for agitation.  8. Laboratory studies reviewed.  9. Will order a CBC with Diff, CMP and Serum Depakote  Level for this evening. 10. Will continue to monitor.  Shatina Streets 06/29/2011, 2:04 PM

## 2011-06-29 NOTE — Progress Notes (Signed)
Pt's 1:1 d/ced today at 1357. Pt sleepy, missing some groups. No inappropriate behavior noted. Does not take tx seriously, age appropriate behavior. Pt to receive EKG as ordered. Labs ordered for tonight. Possible D/C Thursday or Friday.

## 2011-06-30 DIAGNOSIS — F3110 Bipolar disorder, current episode manic without psychotic features, unspecified: Secondary | ICD-10-CM

## 2011-06-30 MED ORDER — DIVALPROEX SODIUM ER 500 MG PO TB24
1000.0000 mg | ORAL_TABLET | Freq: Every day | ORAL | Status: DC
  Administered 2011-06-30: 1000 mg via ORAL
  Filled 2011-06-30 (×3): qty 2

## 2011-06-30 NOTE — Progress Notes (Signed)
Patient requested for Mylanta at the begining of this shift. She reported indigestion and stated that she threw up. Writer offered Mylanta and some gingerale. Patient denied SI/HI and denied hallucinations. She stated that she had a good day. Her mood and affect still somewhat anxious. Writer offered Ativan, she refused it, she said she wasn't really feeling anxious. Patient to stay on the unit due to unpredictable behavior. Q 15 minute check continues to maintain safety.

## 2011-06-30 NOTE — Progress Notes (Signed)
Patient up and about on the unit.  Not attending groups.  Did come to treatment team this morning and states she feels ready to discharge.  Wants to go tomorrow and would like to get a ride from the police.  States she does not like to navigate the bus system.  Patient shows little insight into her alcohol abuse and does not seem terribly distressed about the potential for jail if she does not attend her alcohol abuse education classes upon discharge.  She has been tolerating her medications.  States she does not want to take the Depakote since she in on the Geodon as she feels it will cause her to gain weight.  Encouraged her to take it this evening and discuss with the provider in the morning.

## 2011-06-30 NOTE — Progress Notes (Signed)
Currently resting quietly in bed with eyes closed. Respirations are even and unlabored. No acute distress or discomfort noted. Safety has been maintained with Q15 minute observation. Will continue current POC. 

## 2011-06-30 NOTE — Progress Notes (Signed)
[facility] Group Notes:  (Counselor/Nursing/MHT/Case Management/Adjunct)  06/30/2011 8:46 AM  Type of Therapy:  Group Therapy/Psychoeducation 06/29/11  Participation Level:  Did Not Attend    Nichole Lopez 06/30/2011, 8:46 AM

## 2011-06-30 NOTE — Discharge Planning (Signed)
Met with patient in Aftercare Planning Group.   She was giggling and disruptive with getting drinks, and moving in and out of the room throughout group.  Plan is to discharge patient tomorrow.  Called Sheriff's Dept which advised [employer] will do transportation.  Called [employer], and they will do transportation once a call is made tomorrow re patient being ready, as this cannot be arranged in advance.  No other case management needs today.  Ambrose Mantle, LCSW 06/30/2011, 2:16 PM

## 2011-06-30 NOTE — Progress Notes (Signed)
[facility] Family/Significant Other Suicide Prevention Education  Suicide Prevention Education:  Contact Attempts: Nichole Lopez  () has been identified by the patient as the family member/significant other with whom the patient will be residing, and identified as the person(s) who will aid the patient in the event of a mental health crisis.  With written consent from the patient, two attempts were made to provide suicide prevention education, prior to and/or following the patient's discharge.  We were unsuccessful in providing suicide prevention education.  A suicide education pamphlet was given to the patient to share with family/significant other.  Date and time of first attempt: 06/29/11  2:30 Date and time of second attempt: 06/29/11  4:15 Nichole Lopez, Nichole Lopez 06/30/2011, 4:17 PM
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
## 2011-06-30 NOTE — Progress Notes (Signed)
[facility] Group Notes:  (Counselor/Nursing/MHT/Case Management/Adjunct)  06/30/2011 8:45 AM  Type of Therapy:  Group Therapy 06/28/11   Participation Level:  Did Not Attend     Nichole Lopez 06/30/2011, 8:45 AM

## 2011-07-01 MED ORDER — TRAZODONE HCL 100 MG PO TABS: 100.0000 mg | ORAL_TABLET | Freq: Every day | ORAL | Status: DC

## 2011-07-01 MED ORDER — DIVALPROEX SODIUM ER 500 MG PO TB24: 1500.0000 mg | ORAL_TABLET | Freq: Every day | ORAL | Status: DC

## 2011-07-01 MED ORDER — ONDANSETRON HCL 4 MG PO TABS
4.0000 mg | ORAL_TABLET | Freq: Once | ORAL | Status: AC
  Administered 2011-07-01: 4 mg via ORAL
  Filled 2011-07-01 (×2): qty 1

## 2011-07-01 MED ORDER — TOPIRAMATE 25 MG PO TABS: 25.0000 mg | ORAL_TABLET | Freq: Every day | ORAL | Status: DC

## 2011-07-01 MED ORDER — DIVALPROEX SODIUM ER 500 MG PO TB24
1500.0000 mg | ORAL_TABLET | Freq: Every day | ORAL | Status: DC
  Filled 2011-07-01: qty 3
  Filled 2011-07-01: qty 42

## 2011-07-01 MED ORDER — LOPERAMIDE HCL 2 MG PO CAPS
2.0000 mg | ORAL_CAPSULE | ORAL | Status: DC | PRN
  Administered 2011-07-01: 2 mg via ORAL

## 2011-07-01 MED ORDER — ZIPRASIDONE HCL 40 MG PO CAPS: 80.0000 mg | ORAL_CAPSULE | Freq: Two times a day (BID) | ORAL | Status: DC

## 2011-07-01 NOTE — Progress Notes (Signed)
[facility] Group Notes:  (Counselor/Nursing/MHT/Case Management/Adjunct)  07/01/2011 2:45 PM  Type of Therapy:  Group Therapy  Participation Level:  Did Not Attend    Nichole Lopez 07/01/2011, 2:45 PM

## 2011-07-01 NOTE — Progress Notes (Signed)
Patient ID: Nichole Lopez, female   DOB: January 25, 1986, 26 y.o.   MRN: [id number] Talked to patient about her sister not returning calls. Patient could not identify any others to call that support her and that could be given suicide prevention. Patient does have the material on suicide prevention.

## 2011-07-01 NOTE — Progress Notes (Signed)
Recreation Therapy Notes  07/01/2011         Time: 0930      Group Topic/Focus: The focus of the group is on enhancing the patients' ability to cope with stressors by understanding what coping is, why it is important, the negative effects of stress and developing healthier coping skills. Patients practice [place] and discuss how exercise can be used as a healthy coping strategy.  Participation Level: None  Participation Quality: Resistant  Affect: Labile  Cognitive: Oriented   Additional Comments: Patient testing limits. Patient initially reported she couldn't attend group because she wanted to take a shower. RT and MHT told patient she could shower after group, encouraged her to come. Patient came to group, sat through introductions, then reported she needed to use the restroom and left. Patient came back at the conclusion with visibly wet hair, smirking at RT, didn't participate in processing but rocked heavily back and forth in her chair until group was dismissed.   Leane Loring 07/01/2011 12:33 PM

## 2011-07-01 NOTE — Progress Notes (Signed)
[facility] Case Management Discharge Plan:  Will you be returning to the same living situation after discharge: Yes,  Lives alone At discharge, do you have transportation home?:Yes,  Case Manager will contact [employer] to pick her up and take her home Do you have the ability to pay for your medications:Yes,  insurance and income  Interagency Information:     Release of information consent forms completed and in the chart;  Patient's signature needed at discharge.  Patient to Follow up at:  Follow-up Information    Follow up with Dr. Eulah Pont on 07/05/2011. (9:00AM appointment at the [facility] with the doctor)    Contact information:   [facility] [address]  [address] Telephone:  [phone]      Follow up with Sharen Hint, counselor on 07/05/2011. (1:00PM appointment)    Contact information:   [facility] [place] [address] [place] Telephone:  [phone]      Follow up with Substance Abuse Groups. (Mondays and Wednesdays at 2:00PM)    Contact information:   [facility] [place] [address]  Telephone:  [phone]         Patient denies SI/HI:   Yes,  has denied all week    Safety Planning and Suicide Prevention discussed:  Yes,  During Aftercare Planning Group, Case Manager provided psychoeducation on "Suicide Prevention Information."  This included descriptions of risk factors for suicide, warning signs that an individual is in crisis and thinking of suicide, and what to do if this occurs.  Pt indicated understanding of information provided, and will read brochure given upon discharge.     Barrier to discharge identified:No.  Summary and Recommendations:  Patient is not appropriate for referral to [facility] due to BPD.  She will follow up with [facility] for medication management and with [facility] for groups and therapy.  She has been advised to practice safer behaviors, to not cut herself, to refrain from drinking.  She  agrees to choose safe behaviors.   Sarina Ser 07/01/2011, 11:06 AM
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
## 2011-07-01 NOTE — Progress Notes (Signed)
[facility] Group Notes:  (Counselor/Nursing/MHT/Case Management/Adjunct)  07/01/2011 8:32 AM  Type of Therapy:  Group Therapy  06/30/11 9:30 and Music Therapy 1:15  Participation Level:  Active/Did not attend music therapy  Participation Quality:  Attentive and Sharing  Affect:  Blunted  Cognitive:  Oriented  Insight:  None  Engagement in Group:  Limited  Engagement in Therapy:  Limited  Modes of Intervention:  Clarification, Education and Problem-solving  Summary of Progress/Problems: Patient did not want to attend group but when told that this effected her discharge so she came into group and participated. She stated that she had stopped taking her medications because she was drinking. She stated that she was not planning to stop drinking because she was young and wanted to have fun. She did say that she would be attending classes because she had to. In the past she had not shown up for mandated classes.Expressed poor coping skills and lack of willingness to change.   HartisAram Lopez 07/01/2011, 8:32 AM

## 2011-07-01 NOTE — Progress Notes (Signed)
Nursing DC note Pt is given DC instructions from this nurse via AVS. She states she is neither suicidal and /or homicidal...that she  Denies hearing voices and that she will keep her F/U appts that have been made for her. She is given 2 weeks' worth of meds per pharmacy and then written prescriptions for meds, as well as cc of written DC instrucions. She signed consents for release iof med info per our protocol and then she was escorted to bld entrance to awaiting [employer], who transported her home PD RN [facility]

## 2011-07-01 NOTE — Tx Team (Signed)
Interdisciplinary Treatment Plan Update (Adult)  Date:  07/01/2011  Time Reviewed:  10:15AM-11:15AM  Progress in Treatment: Attending groups:  Yes Participating in groups:    Yes, to a small extent Taking medication as prescribed:    Yes, no refusals Tolerating medication:   Yes, no side effects have been reported by patient or noted by staff Family/Significant other contact made:  Sister has not called back despite numerous attempts Patient understands diagnosis:   Yes, poor insight, fair judgment Discussing patient identified problems/goals with staff:   Yes Medical problems stabilized or resolved:   Yes Denies suicidal/homicidal ideation:  Yes Issues/concerns per patient self-inventory:   None Other:    New problem(s) identified: No, Describe:    Reason for Continuation of Hospitalization: None  Interventions implemented related to continuation of hospitalization:  Medication monitoring and adjustment, safety checks Q15 min., suicide risk assessment, group therapy, psychoeducation, collateral contact, aftercare planning, ongoing physician assessments, medication education - Until Discharge  Additional comments:  Not applicable  Estimated length of stay:  Discharge today  Discharge Plan:  Return to apartment, follow up at [facility] [place] for individual counseling and substance abuse groups & [facility] for medication management  New goal(s):  Not applicable  Review of initial/current patient goals per problem list:   1.  Goal(s):  Deny SI for 48 hours prior to D/C.  Met:  Yes  Target date:  By Discharge   As evidenced by:  Has denied all week  2.  Goal(s):  Reduce psychotic symptoms to baseline per patient and family.  Met:  Yes  Target date:  By Discharge   As evidenced by:  Appears to be lacking psychosis at this time, appears to be only BPD behaviors  3.  Goal(s):  Determine if & how to address substance abuse issues.  Met:  Yes  Target date:  By  Discharge   As evidenced by:  Is supposed to go to [facility] for groups and individual counseling.  She does not actually plan to quit drinking.  4.  Goal(s):  Determine where patient is in process of getting set up with ACT Team  Met:  Yes  Target date:  By Discharge   As evidenced by:  Patient is not appropriate for ACTT referral due to the emphasis on borderline personality behaviors, which is not recommended for ACTT  5. Goal(s): Have no cutting behaviors, be able to be safe off 1:1 for 24 hours prior to D/C.  Met: Yes  Target date: By Discharge  As evidenced by:  Has been off 1:1, no cutting behaviors   Attendees: Patient:  Nichole Lopez  07/01/2011 10:15AM-11:15AM  Family:     Physician:  Dr. Harvie Heck Readling 07/01/2011 10:15AM-11:15AM  Nursing:   Tacy Learn, RN 07/01/2011 10:15AM -11:15AM   Case Manager:  Ambrose Mantle, LCSW 07/01/2011 10:15AM-11:15AM  Counselor:  Veto Kemps, MT-BC 07/01/2011 10:15AM-11:15AM  Other:   Verne Spurr, PA 07/01/2011 10:15AM-11:15AM  Other:   Shelda Jakes, RN 07/01/2011 10:15AM-11:15AM  Other:      Other:       Scribe for Treatment Team:   Sarina Ser, 07/01/2011, 10:15AM-11:15AM

## 2011-07-04 ENCOUNTER — Emergency Department (HOSPITAL_COMMUNITY)
Admission: EM | Admit: 2011-07-04 | Discharge: 2011-07-05 | Disposition: A | Discharge status: Other Acute Inpatient Hospital | Payer: Medicare Other | Attending: Emergency Medicine | Admitting: Emergency Medicine

## 2011-07-04 DIAGNOSIS — F329 Major depressive disorder, single episode, unspecified: Secondary | ICD-10-CM | POA: Insufficient documentation

## 2011-07-04 DIAGNOSIS — F3289 Other specified depressive episodes: Secondary | ICD-10-CM | POA: Insufficient documentation

## 2011-07-04 DIAGNOSIS — Z7289 Other problems related to lifestyle: Secondary | ICD-10-CM

## 2011-07-04 DIAGNOSIS — F10929 Alcohol use, unspecified with intoxication, unspecified: Secondary | ICD-10-CM

## 2011-07-04 DIAGNOSIS — F17200 Nicotine dependence, unspecified, uncomplicated: Secondary | ICD-10-CM | POA: Insufficient documentation

## 2011-07-04 DIAGNOSIS — IMO0002 Reserved for concepts with insufficient information to code with codable children: Secondary | ICD-10-CM | POA: Insufficient documentation

## 2011-07-04 DIAGNOSIS — F489 Nonpsychotic mental disorder, unspecified: Secondary | ICD-10-CM | POA: Insufficient documentation

## 2011-07-04 DIAGNOSIS — X789XXA Intentional self-harm by unspecified sharp object, initial encounter: Secondary | ICD-10-CM | POA: Insufficient documentation

## 2011-07-04 DIAGNOSIS — F1010 Alcohol abuse, uncomplicated: Secondary | ICD-10-CM | POA: Insufficient documentation

## 2011-07-05 ENCOUNTER — Encounter (HOSPITAL_COMMUNITY): Payer: Self-pay | Admitting: Emergency Medicine

## 2011-07-05 LAB — CBC
Hemoglobin: 13.6 g/dL (ref 12.0–15.0)
MCH: 31.1 pg (ref 26.0–34.0)
MCHC: 35.1 g/dL (ref 30.0–36.0)

## 2011-07-05 LAB — BASIC METABOLIC PANEL
BUN: 6 mg/dL (ref 6–23)
Calcium: 9.5 mg/dL (ref 8.4–10.5)
GFR calc non Af Amer: >90 mL/min (ref >90)
Glucose, Bld: 99 mg/dL (ref 70–99)
Sodium: 138 mEq/L (ref 135–145)

## 2011-07-05 LAB — URINALYSIS, ROUTINE W REFLEX MICROSCOPIC
Leukocytes, UA: NEGATIVE
Nitrite: NEGATIVE
Specific Gravity, Urine: 1.011 (ref 1.005–1.030)
Urobilinogen, UA: 0.2 mg/dL (ref 0.0–1.0)

## 2011-07-05 LAB — ETHANOL: Alcohol, Ethyl (B): 176 mg/dL — ABNORMAL HIGH (ref 0–11)

## 2011-07-05 LAB — RAPID URINE DRUG SCREEN, HOSP PERFORMED
Barbiturates: NOT DETECTED
Cocaine: NOT DETECTED

## 2011-07-05 LAB — PREGNANCY, URINE: Preg Test, Ur: NEGATIVE

## 2011-07-05 MED ORDER — LORAZEPAM 1 MG PO TABS
1.0000 mg | ORAL_TABLET | Freq: Three times a day (TID) | ORAL | Status: DC | PRN
  Administered 2011-07-05: 1 mg via ORAL
  Filled 2011-07-05 (×2): qty 1

## 2011-07-05 MED ORDER — TRAZODONE HCL 100 MG PO TABS
100.0000 mg | ORAL_TABLET | Freq: Every day | ORAL | Status: DC
  Administered 2011-07-05: 100 mg via ORAL

## 2011-07-05 MED ORDER — ZIPRASIDONE HCL 20 MG PO CAPS
80.0000 mg | ORAL_CAPSULE | Freq: Two times a day (BID) | ORAL | Status: DC
  Administered 2011-07-05 (×2): 80 mg via ORAL
  Filled 2011-07-05: qty 4

## 2011-07-05 MED ORDER — ONDANSETRON HCL 4 MG PO TABS: 4.0000 mg | ORAL_TABLET | Freq: Three times a day (TID) | ORAL | Status: DC | PRN

## 2011-07-05 MED ORDER — ZIPRASIDONE MESYLATE 20 MG IM SOLR: 20.0000 mg | INTRAMUSCULAR | Status: DC | PRN

## 2011-07-05 MED ORDER — IBUPROFEN 600 MG PO TABS
600.0000 mg | ORAL_TABLET | Freq: Three times a day (TID) | ORAL | Status: DC | PRN
  Administered 2011-07-05: 600 mg via ORAL
  Filled 2011-07-05: qty 1

## 2011-07-05 MED ORDER — ZIPRASIDONE HCL 20 MG PO CAPS
80.0000 mg | ORAL_CAPSULE | Freq: Two times a day (BID) | ORAL | Status: DC
  Filled 2011-07-05: qty 4

## 2011-07-05 MED ORDER — TOPIRAMATE 25 MG PO TABS
25.0000 mg | ORAL_TABLET | Freq: Every day | ORAL | Status: DC
  Administered 2011-07-05: 25 mg via ORAL
  Filled 2011-07-05: qty 1

## 2011-07-05 MED ORDER — TRAZODONE HCL 100 MG PO TABS
100.0000 mg | ORAL_TABLET | Freq: Every day | ORAL | Status: DC
  Filled 2011-07-05: qty 1

## 2011-07-05 MED ORDER — ACETAMINOPHEN 325 MG PO TABS: 650.0000 mg | ORAL_TABLET | ORAL | Status: DC | PRN

## 2011-07-05 MED ORDER — LORAZEPAM 2 MG/ML IJ SOLN: 1.0000 mg | INTRAMUSCULAR | Status: DC | PRN

## 2011-07-05 MED ORDER — NICOTINE 21 MG/24HR TD PT24
21.0000 mg | MEDICATED_PATCH | Freq: Every day | TRANSDERMAL | Status: DC
  Administered 2011-07-05 (×2): 21 mg via TRANSDERMAL
  Filled 2011-07-05 (×2): qty 1

## 2011-07-05 MED ORDER — DIVALPROEX SODIUM ER 500 MG PO TB24
1500.0000 mg | ORAL_TABLET | Freq: Every day | ORAL | Status: DC
  Administered 2011-07-05: 1500 mg via ORAL
  Filled 2011-07-05 (×2): qty 3

## 2011-07-05 NOTE — ED Notes (Signed)
Pt brought in by [employer] after the pt cut herself on the forearm and pt has been drinking

## 2011-07-05 NOTE — ED Notes (Signed)
Pt. Given snack/drink to take with medication.

## 2011-07-05 NOTE — Discharge Planning (Signed)
Patient has been accepted to [facility] by Dr. Forrestine Him. Patient has been IVCed and will be transported by [facility], reporting to [employer].  Nurse to call report to [phone].  EDP and patient's nurse notified of disposition.  Manson Passey Conleigh Heinlein ANN S , MSW, LCSWA 07/05/2011 10:47 AM [phone]

## 2011-07-05 NOTE — ED Notes (Signed)
Report called to [facility] to [place].  Pt. To be transported by [facility]. Sheriff's Depart. Sheriff ETA is approx:  1700.

## 2011-07-05 NOTE — Discharge Instructions (Signed)
Go to [facility]

## 2011-07-05 NOTE — ED Provider Notes (Signed)
History     CSN: [id number]  Arrival date & time 07/04/11  2349   First MD Initiated Contact with Patient 07/05/11 0138      Chief Complaint  Patient presents with  . Medical Clearance    (Consider location/radiation/quality/duration/timing/severity/associated sxs/prior treatment) Patient is a 26 y.o. female presenting with mental health disorder. The history is provided by the patient. No language interpreter was used.  Mental Health Problem The current episode started today. This is a recurrent problem.  The onset of the illness is precipitated by alcohol abuse. The degree of incapacity that she is experiencing as a consequence of her illness is moderate. Sequelae of the illness include an inability to work. Additional symptoms of the illness do not include no appetite change, no fatigue, no headaches or no abdominal pain. She admits to suicidal ideas. She does have a plan to commit suicide. She contemplates harming herself. She has already injured self. She does not contemplate injuring another person. She has not already  injured another person. Risk factors that are present for mental illness include a history of mental illness and substance abuse.    Past Medical History  Diagnosis Date  . Asthma   . Mental disorder PTSD Depression Schizophrenic disorder,  anxiety  . Depression   . Deliberate self-cutting     Past Surgical History  Procedure Date  . Foot surgery     Family History  Problem Relation Age of Onset  . Diabetes Other   . Depression Maternal Uncle     History  Substance Use Topics  . Smoking status: Current Everyday Smoker -- 0.5 packs/day    Types: Cigarettes  . Smokeless tobacco: Not on file  . Alcohol Use: 13.2 oz/week    12 Cans of beer, 10 Shots of liquor per week     Heavy    OB History    Grav Para Term Preterm Abortions TAB SAB Ect Mult Living                  Review of Systems  Constitutional: Negative for fever, activity change,  appetite change and fatigue.  HENT: Negative for congestion, sore throat, rhinorrhea, neck pain and neck stiffness.   Respiratory: Negative for cough and shortness of breath.   Cardiovascular: Negative for chest pain and palpitations.  Gastrointestinal: Negative for nausea, vomiting and abdominal pain.  Genitourinary: Negative for dysuria, urgency, frequency and flank pain.  Musculoskeletal: Negative for myalgias, back pain and arthralgias.  Neurological: Negative for dizziness, weakness, light-headedness, numbness and headaches.  Psychiatric/Behavioral: Positive for suicidal ideas. Negative for self-injury. The patient is not nervous/anxious.   All other systems reviewed and are negative.    Allergies  Review of patient's allergies indicates no known allergies.  Home Medications   Current Outpatient Rx  Name Route Sig Dispense Refill  . DIVALPROEX SODIUM ER 500 MG PO TB24 Oral Take 3 tablets (1,500 mg total) by mouth daily at 10 pm. For mood stability. 90 tablet 0  . TOPIRAMATE 25 MG PO TABS Oral Take 1 tablet (25 mg total) by mouth daily at 10 pm. For mood stabilization. 30 tablet 0  . TRAZODONE HCL 100 MG PO TABS Oral Take 1 tablet (100 mg total) by mouth at bedtime. For sleep. 30 tablet 0  . ZIPRASIDONE HCL 40 MG PO CAPS Oral Take 2 capsules (80 mg total) by mouth 2 (two) times daily with a meal. For mood stability. 60 capsule 0    BP 115/73  Pulse  92  Temp(Src) 98.4 F (36.9 C) (Oral)  Resp 18  Wt 196 lb (88.905 kg)  SpO2 100%  LMP 06/08/2011  Physical Exam  Nursing note and vitals reviewed. Constitutional: She is oriented to person, place, and time. She appears well-developed and well-nourished. No distress.  HENT:  Head: Normocephalic and atraumatic.  Mouth/Throat: Oropharynx is clear and moist.  Eyes: Conjunctivae and EOM are normal. Pupils are equal, round, and reactive to light.  Neck: Normal range of motion. Neck supple.  Cardiovascular: Normal rate, regular  rhythm, normal heart sounds and intact distal pulses.  Exam reveals no gallop and no friction rub.   No murmur heard. Pulmonary/Chest: Effort normal and breath sounds normal. No respiratory distress. She exhibits no tenderness.  Abdominal: Soft. Bowel sounds are normal. There is no tenderness.  Musculoskeletal: Normal range of motion. She exhibits no tenderness.  Neurological: She is alert and oriented to person, place, and time. No cranial nerve deficit.  Skin: Skin is warm and dry.       Abrasions to her left volar forearm  Psychiatric: She is agitated. She expresses impulsivity. She exhibits a depressed mood. She expresses suicidal ideation. She expresses no homicidal ideation. She expresses suicidal plans. She expresses no homicidal plans.    ED Course  Procedures (including critical care time)  Labs Reviewed  ETHANOL - Abnormal; Notable for the following:    Alcohol, Ethyl (B) 176 (*)    All other components within normal limits  CBC  BASIC METABOLIC PANEL  URINALYSIS, ROUTINE W REFLEX MICROSCOPIC  PREGNANCY, URINE  URINE RAPID DRUG SCREEN (HOSP PERFORMED)   No results found.   1. Alcohol intoxication   2. Depression   3. Self mutilating behavior       MDM  Medically clear for psychiatric evaluation. Psychiatric holding orders were placed. Her medications were ordered. Her wounds were clean and there is no indication for repair is here superficial abrasions. Will discuss with the ACT team and a specialist on call consult was ordered. We'll dispo per recommendation        Dayton Bailiff, MD 07/05/11 [phone]

## 2011-07-05 NOTE — ED Notes (Signed)
Pt has been accepted to [facility] by Dr. Forrestine Him. Pt will report to [facility] building [facility]. EDP notified and is in agreement with disposition. Rn made aware to call report.

## 2011-07-05 NOTE — ED Notes (Signed)
For Departure Condition, see 1703, not 1708.

## 2011-07-05 NOTE — ED Notes (Signed)
MD at bedside. 

## 2011-07-05 NOTE — ED Notes (Addendum)
Pt presents to unit per her request stating that she "is going to try and be good". Pt is hyperactive but smiling, pleasant and cooperative. Encouraged pt to stay in room and try to rest after medication administration due to other patients sleeping. Pt states that she will try and be quiet and she will close her door so she doesn't "wake up that little old lady across the hall". Pt goes to BR, makes some wild gestures with her arms while walking, returns to her room and lies down to watch TV. Left forearm shows multiple scratches that pt states were self-inflicted before admission. Wounds are dry at this time but area is reddened. Area cleaned with NS and patted dry with gauze. Pt refuses offer for ointment. Writer encourages patient to alert nurse with her needs. Pt agrees to do so and is appreciative.
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
## 2011-07-07 NOTE — Progress Notes (Signed)
Patient Discharge Instructions:  After Visit Summary (AVS):   Faxed to:  07/05/2011 Psychiatric Admission Assessment Note:   Faxed to:  07/05/2011 Suicide Risk Assessment - Discharge Assessment:   Faxed to:  07/05/2011 Faxed/Sent to the Next Level Care provider:  07/05/2011  Faxed to [facility] - Dr. Eulah Pont @ [phone] And to [facility] [place] @ [phone]  Wandra Scot, 07/07/2011, 12:59 PM
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
## 2011-09-30 ENCOUNTER — Emergency Department (HOSPITAL_COMMUNITY)
Admission: EM | Admit: 2011-09-30 | Discharge: 2011-10-01 | Disposition: A | Discharge status: Other Acute Inpatient Hospital | Payer: Medicare Other | Attending: Emergency Medicine | Admitting: Emergency Medicine

## 2011-09-30 ENCOUNTER — Encounter (HOSPITAL_COMMUNITY): Payer: Self-pay | Admitting: Emergency Medicine

## 2011-09-30 DIAGNOSIS — Z8659 Personal history of other mental and behavioral disorders: Secondary | ICD-10-CM | POA: Insufficient documentation

## 2011-09-30 DIAGNOSIS — T43591A Poisoning by other antipsychotics and neuroleptics, accidental (unintentional), initial encounter: Secondary | ICD-10-CM | POA: Insufficient documentation

## 2011-09-30 DIAGNOSIS — F319 Bipolar disorder, unspecified: Secondary | ICD-10-CM | POA: Insufficient documentation

## 2011-09-30 DIAGNOSIS — T50901A Poisoning by unspecified drugs, medicaments and biological substances, accidental (unintentional), initial encounter: Secondary | ICD-10-CM

## 2011-09-30 DIAGNOSIS — F329 Major depressive disorder, single episode, unspecified: Secondary | ICD-10-CM

## 2011-09-30 DIAGNOSIS — R45851 Suicidal ideations: Secondary | ICD-10-CM | POA: Insufficient documentation

## 2011-09-30 DIAGNOSIS — J45909 Unspecified asthma, uncomplicated: Secondary | ICD-10-CM | POA: Insufficient documentation

## 2011-09-30 DIAGNOSIS — T424X4A Poisoning by benzodiazepines, undetermined, initial encounter: Secondary | ICD-10-CM | POA: Insufficient documentation

## 2011-09-30 DIAGNOSIS — F17200 Nicotine dependence, unspecified, uncomplicated: Secondary | ICD-10-CM | POA: Insufficient documentation

## 2011-09-30 DIAGNOSIS — I959 Hypotension, unspecified: Secondary | ICD-10-CM | POA: Insufficient documentation

## 2011-09-30 HISTORY — DX: Post-traumatic stress disorder, unspecified: F43.10

## 2011-09-30 HISTORY — DX: Bipolar disorder, unspecified: F31.9

## 2011-09-30 LAB — CBC WITH DIFFERENTIAL/PLATELET
Basophils Absolute: 0 10*3/uL (ref 0.0–0.1)
Basophils Relative: 1 % (ref 0–1)
Eosinophils Relative: 1 % (ref 0–5)
HCT: 41.9 % (ref 36.0–46.0)
MCHC: 35.3 g/dL (ref 30.0–36.0)
MCV: 87.3 fL (ref 78.0–100.0)
Monocytes Absolute: 0.2 10*3/uL (ref 0.1–1.0)
Neutro Abs: 2.8 10*3/uL (ref 1.7–7.7)
Platelets: 250 10*3/uL (ref 150–400)
RDW: 11.8 % (ref 11.5–15.5)

## 2011-09-30 LAB — RAPID URINE DRUG SCREEN, HOSP PERFORMED
Amphetamines: NOT DETECTED
Cocaine: NOT DETECTED
Opiates: NOT DETECTED
Tetrahydrocannabinol: NOT DETECTED

## 2011-09-30 LAB — URINALYSIS, ROUTINE W REFLEX MICROSCOPIC
Glucose, UA: NEGATIVE mg/dL
Hgb urine dipstick: NEGATIVE
Specific Gravity, Urine: 1.008 (ref 1.005–1.030)
Urobilinogen, UA: 0.2 mg/dL (ref 0.0–1.0)
pH: 6 (ref 5.0–8.0)

## 2011-09-30 LAB — URINE MICROSCOPIC-ADD ON

## 2011-09-30 LAB — COMPREHENSIVE METABOLIC PANEL
AST: 14 U/L (ref 0–37)
Albumin: 4.4 g/dL (ref 3.5–5.2)
Calcium: 9.3 mg/dL (ref 8.4–10.5)
Creatinine, Ser: 0.84 mg/dL (ref 0.50–1.10)
Total Protein: 7.4 g/dL (ref 6.0–8.3)

## 2011-09-30 LAB — POCT PREGNANCY, URINE: Preg Test, Ur: NEGATIVE

## 2011-09-30 LAB — VALPROIC ACID LEVEL: Valproic Acid Lvl: <10 ug/mL — ABNORMAL LOW (ref 50.0–100.0)

## 2011-09-30 LAB — SALICYLATE LEVEL: Salicylate Lvl: <2 mg/dL — ABNORMAL LOW (ref 2.8–20.0)

## 2011-09-30 MED ORDER — ZOLPIDEM TARTRATE 5 MG PO TABS: 10.0000 mg | ORAL_TABLET | Freq: Every evening | ORAL | Status: DC | PRN

## 2011-09-30 MED ORDER — CLONAZEPAM 0.5 MG PO TABS: 0.5000 mg | ORAL_TABLET | Freq: Two times a day (BID) | ORAL | Status: DC | PRN

## 2011-09-30 MED ORDER — ACETAMINOPHEN 325 MG PO TABS: 650.0000 mg | ORAL_TABLET | ORAL | Status: DC | PRN

## 2011-09-30 MED ORDER — SODIUM CHLORIDE 0.9 % IV BOLUS (SEPSIS)
1000.0000 mL | Freq: Once | INTRAVENOUS | Status: AC
  Administered 2011-09-30: 1000 mL via INTRAVENOUS

## 2011-09-30 MED ORDER — TRAZODONE HCL 100 MG PO TABS
100.0000 mg | ORAL_TABLET | Freq: Every day | ORAL | Status: DC
  Administered 2011-09-30: 100 mg via ORAL
  Filled 2011-09-30: qty 1

## 2011-09-30 MED ORDER — ALUM & MAG HYDROXIDE-SIMETH 200-200-20 MG/5ML PO SUSP: 30.0000 mL | ORAL | Status: DC | PRN

## 2011-09-30 MED ORDER — ZIPRASIDONE HCL 80 MG PO CAPS
80.0000 mg | ORAL_CAPSULE | Freq: Two times a day (BID) | ORAL | Status: DC
  Administered 2011-09-30 – 2011-10-01 (×3): 80 mg via ORAL
  Filled 2011-09-30 (×5): qty 1

## 2011-09-30 MED ORDER — IBUPROFEN 200 MG PO TABS: 600.0000 mg | ORAL_TABLET | Freq: Three times a day (TID) | ORAL | Status: DC | PRN

## 2011-09-30 MED ORDER — ONDANSETRON HCL 8 MG PO TABS: 4.0000 mg | ORAL_TABLET | Freq: Three times a day (TID) | ORAL | Status: DC | PRN

## 2011-09-30 MED ORDER — LORAZEPAM 1 MG PO TABS
1.0000 mg | ORAL_TABLET | Freq: Three times a day (TID) | ORAL | Status: DC | PRN
  Administered 2011-09-30 – 2011-10-01 (×2): 1 mg via ORAL
  Filled 2011-09-30 (×2): qty 1

## 2011-09-30 MED ORDER — NICOTINE 21 MG/24HR TD PT24
21.0000 mg | MEDICATED_PATCH | Freq: Every day | TRANSDERMAL | Status: DC
  Administered 2011-09-30 – 2011-10-01 (×2): 21 mg via TRANSDERMAL
  Filled 2011-09-30 (×2): qty 1

## 2011-09-30 MED ORDER — CHARCOAL ACTIVATED PO LIQD
100.0000 g | Freq: Once | ORAL | Status: AC
  Administered 2011-09-30: 100 g via ORAL
  Filled 2011-09-30: qty 480

## 2011-09-30 NOTE — ED Notes (Signed)
ACT Team in to interview pt.

## 2011-09-30 NOTE — ED Notes (Signed)
Peripheral IV removed.  22g right AC.  Catheter in tact.  No signs or symptoms of infection.  Bleeding stopped.  Dressing placed.

## 2011-09-30 NOTE — [facility] Counselor (Signed)
Nichole Lopez, assessment counselor at [facility], submitted Pt for admission at [facility]. Consulted with Theodoro Kos, [facility] who confirmed bed is available. Dr. Harvie Heck Readling reviewed clinical information and declined Pt due to Pt having reached maximum therapeutic benefit at this facility. Notified Nichole Lopez of disposition.  Harlin Rain Patsy [place], LPC
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
## 2011-09-30 NOTE — ED Notes (Signed)
Pt. Took several different types of medications tonight with intent to overdose. Pt. Advised of importance of plan of care. Sitter at bedside.

## 2011-09-30 NOTE — ED Notes (Signed)
PER EMS- Patient intentionally took three 100 mg trazadone, one Xannax, three Klonnopin, 1/5 of vodka. Pt is hypotensive, HR in 70's. BP currently 90/50. CBG-92. O2 100%. Pt vomited once on scene.

## 2011-09-30 NOTE — ED Provider Notes (Cosign Needed)
History     CSN: [id number]  Arrival date & time 09/30/11  0047   First MD Initiated Contact with Patient 09/30/11 0056      Chief Complaint  Patient presents with  . Drug Overdose   Level V caveat for unstable vital signs.  (Consider location/radiation/quality/duration/timing/severity/associated sxs/prior treatment) HPI  Patient reports she has a history of depression. She reports she was last admitted after a overdose [facility] [place]. She relates about 12:30 or about 20 minutes prior to arrival she took 3 of her trazodone, one Xanax in 3 Klonopin with a fifth of vodka. EMS reports her blood pressure was in the 70s. Patient states she thinks she vomited at home. Patient states she thinks she is the one who called EMS. She states "I don't have a life". She states she's been very depressed. She denies anything specific happening tonight it made her try to overdose. She denies nausea now. She states she feels more awake now worse before she felt very tired.  PCP Dr Concepcion [facility] Mixer  Past Medical History  Diagnosis Date  . Asthma   . Mental disorder PTSD Depression Schizophrenic disorder,  anxiety  . Depression   . Deliberate self-cutting   . PTSD (post-traumatic stress disorder)   . Bipolar 1 disorder     Past Surgical History  Procedure Date  . Foot surgery     Family History  Problem Relation Age of Onset  . Diabetes Other   . Depression Maternal Uncle     History  Substance Use Topics  . Smoking status: Current Everyday Smoker -- 0.5 packs/day    Types: Cigarettes  . Smokeless tobacco: Not on file  . Alcohol Use: 13.2 oz/week    12 Cans of beer, 10 Shots of liquor per week     Heavy  on disability Drinks a 12 pack + 1 bottle liquor weekly  OB History    Grav Para Term Preterm Abortions TAB SAB Ect Mult Living                  Review of Systems  Unable to perform ROS:  Unstable vital signs    Allergies  Review of patient's allergies indicates no known allergies.  Home Medications   Current Outpatient Rx  Name Route Sig Dispense Refill  . XANAX PO Oral Take 1 tablet by mouth once.    Marland Kitchen KLONOPIN PO Oral Take 1 tablet by mouth 2 (two) times daily.    . TRAZODONE HCL 100 MG PO TABS Oral Take 1 tablet (100 mg total) by mouth at bedtime. For sleep. 30 tablet 0  . TRAZODONE HCL 100 MG PO TABS Oral Take 300 mg by mouth once.    Marland Kitchen ZIPRASIDONE HCL 40 MG PO CAPS Oral Take 2 capsules (80 mg total) by mouth 2 (two) times daily with a meal. For mood stability. 60 capsule 0    BP 97/59  Temp 97.9 F (36.6 C) (Oral)  Resp 16  SpO2 100%  Vital signs normal except for hypotensive   Physical Exam  Nursing note and vitals reviewed. Constitutional: She is oriented to person, place, and time. She appears well-developed and well-nourished.  Non-toxic appearance. She does not appear ill. No distress.  HENT:  Head: Normocephalic and atraumatic.  Right Ear: External ear normal.  Left Ear: External ear normal.  Nose: Nose normal. No mucosal edema or rhinorrhea.  Mouth/Throat: Mucous membranes are normal.  No dental abscesses or uvula swelling.       Mucous membranes dry  Eyes: Conjunctivae and EOM are normal. Pupils are equal, round, and reactive to light.  Neck: Normal range of motion and full passive range of motion without pain. Neck supple.  Cardiovascular: Normal rate, regular rhythm and normal heart sounds.  Exam reveals no gallop and no friction rub.   No murmur heard. Pulmonary/Chest: Effort normal and breath sounds normal. No respiratory distress. She has no wheezes. She has no rhonchi. She has no rales. She exhibits no tenderness and no crepitus.  Abdominal: Soft. Normal appearance and bowel sounds are normal. She exhibits no distension. There is no tenderness. There is no rebound and no guarding.  Musculoskeletal: Normal range of motion. She exhibits no  edema and no tenderness.       Moves all extremities well.   Neurological: She is oriented to person, place, and time. She has normal strength. No cranial nerve deficit.       Patient has some lethargy.  Skin: Skin is warm, dry and intact. No rash noted. No erythema. No pallor.  Psychiatric: Her speech is normal and behavior is normal. Her mood appears not anxious.       Flat affect    ED Course  Procedures (including critical care time)   Medications  charcoal activated (NO SORBITOL) (ACTIDOSE-AQUA) suspension 100 g (100 g Oral Given 09/30/11 0135)  sodium chloride 0.9 % bolus 1,000 mL (1000 mL Intravenous Given 09/30/11 0135)  Repeat saline bolus  Pt remains with BP in the 90's but is also in bed and sleeping during most of the night. She is easily arousable and states she feels fine.   05:25 Patty at [employer] states can discharge at 6:30 if BP improves when she stands up and she isn't wobbly on standing.   07:00 orthostatic VS are good. Pt states she is agreeable to be admitted to a psychiatric facility. Foley removed.  Review of [place] controlled substance site shows patient only has one entry and that is for Klonopin 0.5 mg prescribed in July.   07:27  Berna Spare, ACT will put on list   Results for orders placed during the hospital encounter of 09/30/11  CBC WITH DIFFERENTIAL      Component Value Range   WBC 5.2  4.0 - 10.5 K/uL   RBC 4.80  3.87 - 5.11 MIL/uL   Hemoglobin 14.8  12.0 - 15.0 g/dL   HCT 16.1  09.6 - 04.5 %   MCV 87.3  78.0 - 100.0 fL   MCH 30.8  26.0 - 34.0 pg   MCHC 35.3  30.0 - 36.0 g/dL   RDW 40.9  81.1 - 91.4 %   Platelets 250  150 - 400 K/uL   Neutrophils Relative 54  43 - 77 %   Neutro Abs 2.8  1.7 - 7.7 K/uL   Lymphocytes Relative 40  12 - 46 %   Lymphs Abs 2.1  0.7 - 4.0 K/uL   Monocytes Relative 4  3 - 12 %   Monocytes Absolute 0.2  0.1 - 1.0 K/uL   Eosinophils Relative 1  0 - 5 %   Eosinophils Absolute 0.0  0.0 - 0.7 K/uL    Basophils Relative 1  0 - 1 %   Basophils Absolute 0.0  0.0 - 0.1 K/uL  COMPREHENSIVE METABOLIC PANEL      Component Value Range   Sodium 138  135 - 145 mEq/L  Potassium 3.8  3.5 - 5.1 mEq/L   Chloride 104  96 - 112 mEq/L   CO2 22  19 - 32 mEq/L   Glucose, Bld 86  70 - 99 mg/dL   BUN 7  6 - 23 mg/dL   Creatinine, Ser 4.09  0.50 - 1.10 mg/dL   Calcium 9.3  8.4 - 81.1 mg/dL   Total Protein 7.4  6.0 - 8.3 g/dL   Albumin 4.4  3.5 - 5.2 g/dL   AST 14  0 - 37 U/L   ALT 5  0 - 35 U/L   Alkaline Phosphatase 49  39 - 117 U/L   Total Bilirubin 0.3  0.3 - 1.2 mg/dL   GFR calc non Af Amer >90  >90 mL/min   GFR calc Af Amer >90  >90 mL/min  URINALYSIS, ROUTINE W REFLEX MICROSCOPIC      Component Value Range   Color, Urine YELLOW  YELLOW   APPearance CLEAR  CLEAR   Specific Gravity, Urine 1.008  1.005 - 1.030   pH 6.0  5.0 - 8.0   Glucose, UA NEGATIVE  NEGATIVE mg/dL   Hgb urine dipstick NEGATIVE  NEGATIVE   Bilirubin Urine NEGATIVE  NEGATIVE   Ketones, ur NEGATIVE  NEGATIVE mg/dL   Protein, ur NEGATIVE  NEGATIVE mg/dL   Urobilinogen, UA 0.2  0.0 - 1.0 mg/dL   Nitrite NEGATIVE  NEGATIVE   Leukocytes, UA TRACE (*) NEGATIVE  URINE RAPID DRUG SCREEN (HOSP PERFORMED)      Component Value Range   Opiates NONE DETECTED  NONE DETECTED   Cocaine NONE DETECTED  NONE DETECTED   Benzodiazepines NONE DETECTED  NONE DETECTED   Amphetamines NONE DETECTED  NONE DETECTED   Tetrahydrocannabinol NONE DETECTED  NONE DETECTED   Barbiturates NONE DETECTED  NONE DETECTED  ACETAMINOPHEN LEVEL      Component Value Range   Acetaminophen (Tylenol), Serum <15.0  10 - 30 ug/mL  ETHANOL      Component Value Range   Alcohol, Ethyl (B) 169 (*) 0 - 11 mg/dL  SALICYLATE LEVEL      Component Value Range   Salicylate Lvl <2.0 (*) 2.8 - 20.0 mg/dL  ACETAMINOPHEN LEVEL      Component Value Range   Acetaminophen (Tylenol), Serum <15.0  10 - 30 ug/mL  SALICYLATE LEVEL      Component Value Range   Salicylate Lvl  <2.0 (*) 2.8 - 20.0 mg/dL  VALPROIC ACID LEVEL      Component Value Range   Valproic Acid Lvl <10.0 (*) 50.0 - 100.0 ug/mL  POCT PREGNANCY, URINE      Component Value Range   Preg Test, Ur NEGATIVE  NEGATIVE  URINE MICROSCOPIC-ADD ON      Component Value Range   Squamous Epithelial / LPF RARE  RARE   WBC, UA 0-2  <3 WBC/hpf   Bacteria, UA RARE  RARE    Laboratory interpretation all normal       Date: 09/30/2011  Rate: 71  Rhythm: normal sinus rhythm  QRS Axis: normal  Intervals: normal  ST/T Wave abnormalities: normal  Conduction Disutrbances:none  Narrative Interpretation:   Old EKG Reviewed: unchanged from 06/29/2011     1. Bipolar 1 disorder   2. Overdose   3. Depression   4. Suicidal ideation   5. Hypotension    Plan admission to psychiatric facility per ACT  Devoria Albe, MD, FACEP    MDM  Ward Givens, MD 09/30/11 1610  Ward Givens, MD 09/30/11 [phone]

## 2011-09-30 NOTE — ED Notes (Signed)
Updated Poison control pt. POC and health care status.

## 2011-10-01 NOTE — BH Assessment (Signed)
[facility] Assessment Progress Note      Faxed copy of pt's findings and custody and 1st exam to [facility].  Gave originals to [employer] with security for transport to [facility] to be picked up by courier and delivered to [employer]

## 2011-10-01 NOTE — ED Notes (Signed)
Patient returned to her room. Security explained to patient that she is not to leave this unit.

## 2011-10-01 NOTE — ED Notes (Addendum)
Patient left unit with sitter walking with her. Security paged and they are looking for the patient.

## 2011-10-01 NOTE — BH Assessment (Signed)
[facility] Assessment Progress Note      Pt accepted to [facility] by Dr Les Pou pending IVC.  Completed IVC paperwork (affadavit and petition and first exam by Dr Adriana Simas) and had it faxed to [facility].  Awaiting Findings and Custody.  [facility] transport to request transport to [employer] in the morning. Dr Adriana Simas notified of disposition

## 2011-11-03 ENCOUNTER — Encounter (HOSPITAL_COMMUNITY): Payer: Self-pay

## 2011-11-03 ENCOUNTER — Emergency Department (HOSPITAL_COMMUNITY)
Admission: EM | Admit: 2011-11-03 | Discharge: 2011-11-04 | Disposition: A | Discharge status: Home / Self Care | Payer: Medicare Other | Attending: Emergency Medicine | Admitting: Emergency Medicine

## 2011-11-03 DIAGNOSIS — F1010 Alcohol abuse, uncomplicated: Secondary | ICD-10-CM | POA: Insufficient documentation

## 2011-11-03 DIAGNOSIS — F3289 Other specified depressive episodes: Secondary | ICD-10-CM | POA: Insufficient documentation

## 2011-11-03 DIAGNOSIS — F10929 Alcohol use, unspecified with intoxication, unspecified: Secondary | ICD-10-CM

## 2011-11-03 DIAGNOSIS — F329 Major depressive disorder, single episode, unspecified: Secondary | ICD-10-CM | POA: Insufficient documentation

## 2011-11-03 DIAGNOSIS — J45909 Unspecified asthma, uncomplicated: Secondary | ICD-10-CM | POA: Insufficient documentation

## 2011-11-03 MED ORDER — ZIPRASIDONE MESYLATE 20 MG IM SOLR
20.0000 mg | Freq: Once | INTRAMUSCULAR | Status: AC
  Administered 2011-11-03: 20 mg via INTRAMUSCULAR

## 2011-11-03 MED ORDER — DIPHENHYDRAMINE HCL 50 MG/ML IJ SOLN
50.0000 mg | Freq: Once | INTRAMUSCULAR | Status: AC
  Administered 2011-11-03: 50 mg via INTRAMUSCULAR
  Filled 2011-11-03: qty 1

## 2011-11-03 MED ORDER — ZIPRASIDONE MESYLATE 20 MG IM SOLR
INTRAMUSCULAR | Status: AC
  Administered 2011-11-03: 20 mg via INTRAMUSCULAR
  Filled 2011-11-03: qty 20
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
## 2011-11-03 NOTE — ED Notes (Signed)
[id number]<AV> Expected date:<BR> Expected time:<BR> Means of arrival:<BR> Comments:<BR> TRIAGE

## 2011-11-04 DIAGNOSIS — F1994 Other psychoactive substance use, unspecified with psychoactive substance-induced mood disorder: Secondary | ICD-10-CM

## 2011-11-04 DIAGNOSIS — F39 Unspecified mood [affective] disorder: Secondary | ICD-10-CM

## 2011-11-04 LAB — COMPREHENSIVE METABOLIC PANEL
ALT: 7 U/L (ref 0–35)
AST: 15 U/L (ref 0–37)
Alkaline Phosphatase: 52 U/L (ref 39–117)
CO2: 20 mEq/L (ref 19–32)
Chloride: 101 mEq/L (ref 96–112)
GFR calc Af Amer: >90 mL/min (ref >90)
GFR calc non Af Amer: >90 mL/min (ref >90)
Glucose, Bld: 99 mg/dL (ref 70–99)
Potassium: 3.5 mEq/L (ref 3.5–5.1)
Sodium: 136 mEq/L (ref 135–145)

## 2011-11-04 LAB — ETHANOL: Alcohol, Ethyl (B): 193 mg/dL — ABNORMAL HIGH (ref 0–11)

## 2011-11-04 LAB — CBC WITH DIFFERENTIAL/PLATELET
Lymphocytes Relative: 34 % (ref 12–46)
Lymphs Abs: 2.4 10*3/uL (ref 0.7–4.0)
MCV: 87.4 fL (ref 78.0–100.0)
Neutro Abs: 4.1 10*3/uL (ref 1.7–7.7)
Neutrophils Relative %: 58 % (ref 43–77)
Platelets: 312 10*3/uL (ref 150–400)
RBC: 4.69 MIL/uL (ref 3.87–5.11)
WBC: 7 10*3/uL (ref 4.0–10.5)

## 2011-11-04 LAB — RAPID URINE DRUG SCREEN, HOSP PERFORMED: Barbiturates: NOT DETECTED

## 2011-11-04 LAB — POCT PREGNANCY, URINE: Preg Test, Ur: NEGATIVE

## 2011-11-04 MED ORDER — TRAZODONE HCL 100 MG PO TABS: 100.0000 mg | ORAL_TABLET | Freq: Every day | ORAL | Status: DC

## 2011-11-04 MED ORDER — ZIPRASIDONE HCL 20 MG PO CAPS
80.0000 mg | ORAL_CAPSULE | Freq: Two times a day (BID) | ORAL | Status: DC
  Administered 2011-11-04: 80 mg via ORAL
  Filled 2011-11-04: qty 4

## 2011-11-04 MED ORDER — LORAZEPAM 1 MG PO TABS: 1.0000 mg | ORAL_TABLET | Freq: Three times a day (TID) | ORAL | Status: DC | PRN

## 2011-11-04 NOTE — ED Notes (Signed)
Pt sleeping on continuous pulse ox. [employer] at bedside

## 2011-11-04 NOTE — ED Notes (Signed)
Pt ambulated to bathroom. Sitter with pt

## 2011-11-04 NOTE — [facility] Counselor (Signed)
Learned that pt was IVC. Completed rescind for pt to be discharged.

## 2011-11-04 NOTE — BH Assessment (Signed)
Assessment Note   Nichole Lopez is an 26 y.o. female. Initially brought in via [employer] after police found her drunk in traffic. Pt denies SI or that it was an SI attempt stating "I was trying to prove a point to a cop. All I said was what if I were trying to kill myself after they threatened to arrest me." Pt admitted to being drunk at the time and was trying to "direct traffic". During time in ED pt has at times been uncooperative and exhibited some attention seeking behaviors (laying in floor, calling psychiatrist names, attempting to flee from unit, etc). Pt denies SI, HI or psychosis. Pt reports having ACTT with [facility] and Geroge Baseman is her main counselor. Pt did apologize to the psychiatrist and voiced understating how her behaviors may not illicit the desired responses. Pt did express her desire to return home and was not interested in SA treatment. Pt evaluation from the psychiatrist was to recommend discharge and return to care of [facility]. Updated EDP and RN.  Axis I: Substance Induced Mood DO and Alcohol Intoxication Axis II: Deferred Axis III:  Past Medical History  Diagnosis Date  . Asthma   . Mental disorder PTSD Depression Schizophrenic disorder,  anxiety  . Depression   . Deliberate self-cutting   . PTSD (post-traumatic stress disorder)   . Bipolar 1 disorder    Axis IV: problems related to legal system/crime and problems related to social environment Axis V: 41-50 serious symptoms  Past Medical History:  Past Medical History  Diagnosis Date  . Asthma   . Mental disorder PTSD Depression Schizophrenic disorder,  anxiety  . Depression   . Deliberate self-cutting   . PTSD (post-traumatic stress disorder)   . Bipolar 1 disorder     Past Surgical History  Procedure Date  . Foot surgery     Family History:  Family History  Problem Relation Age of Onset  . Diabetes Other   . Depression Maternal Uncle     Social History:  reports that she has been  smoking Cigarettes.  She has been smoking about .5 packs per day. She does not have any smokeless tobacco history on file. She reports that she drinks about 13.2 ounces of alcohol per week. She reports that she uses illicit drugs (Marijuana).  Additional Social History:  Alcohol / Drug Use Pain Medications: N/A Prescriptions: See attached PTA Listing Over the Counter: N/A Longest period of sobriety (when/how long): Unknown Substance #1 Name of Substance 1: THC 1 - Age of First Use: 15 1 - Amount (size/oz): varies 1 - Frequency: varies 1 - Duration: years 1 - Last Use / Amount: Unknown Substance #2 Name of Substance 2: ETOH 2 - Age of First Use: 14 2 - Amount (size/oz): 6-7 beers 2 - Frequency: daily 2 - Duration: years 2 - Last Use / Amount: 11/03/11  6-7 beers  CIWA: CIWA-Ar BP: 104/58 mmHg Pulse Rate: 82  COWS:    Allergies: No Known Allergies  Home Medications:  (Not in a hospital admission)  OB/GYN Status:  No LMP recorded.  General Assessment Data Location of Assessment: [facility] ED Living Arrangements: Alone Can pt return to current living arrangement?: Yes Admission Status: Voluntary Is patient capable of signing voluntary admission?: Yes Transfer from: Acute Hospital Referral Source: Other ([employer])  Education Status Is patient currently in school?: No Current Grade: N/A Highest grade of school patient has completed: 8  Risk to self Suicidal Ideation: No-Not Currently/Within Last 6 Months (pt denies)  Suicidal Intent: No-Not Currently/Within Last 6 Months Is patient at risk for suicide?: No Suicidal Plan?: No-Not Currently/Within Last 6 Months (pt denies) Specify Current Suicidal Plan: N/A Access to Means: No What has been your use of drugs/alcohol within the last 12 months?: ETOH; THC Previous Attempts/Gestures: Yes (pt denies - hx of priors from past ACT assessments) How many times?: 10  Other Self Harm Risks: Self medicating; cutting Triggers for Past  Attempts: Other personal contacts Intentional Self Injurious Behavior: Cutting (stated last cut was "awhile" ago) Comment - Self Injurious Behavior: scarring on arms from prior cutting Family Suicide History: Yes Recent stressful life event(s): Other (Comment) (picked up by [employer] for being in traffic) Persecutory voices/beliefs?: No Depression: No (pt denies) Substance abuse history and/or treatment for substance abuse?: Yes Suicide prevention information given to non-admitted patients: Not applicable  Risk to Others Homicidal Ideation: No (pt denies) Thoughts of Harm to Others: No (pt denies) Current Homicidal Intent: No (pt denies) Current Homicidal Plan: No Access to Homicidal Means: No Identified Victim: N/A History of harm to others?: No Assessment of Violence: None Noted Violent Behavior Description: N/A Does patient have access to weapons?: No Criminal Charges Pending?: No Does patient have a court date: No  Psychosis Hallucinations: None noted Delusions: None noted  Mental Status Report Appear/Hygiene: Disheveled Eye Contact: Fair Motor Activity: Freedom of movement Speech: Logical/coherent Level of Consciousness: Alert Mood: Other (Comment);Irritable (attention-seeking) Affect: Appropriate to circumstance Anxiety Level: Minimal Thought Processes: Coherent;Relevant Judgement: Impaired Orientation: Person;Place;Time;Situation Obsessive Compulsive Thoughts/Behaviors: None  Cognitive Functioning Concentration: Normal Memory: Recent Intact;Remote Intact IQ: Average Insight: Fair Impulse Control: Poor Appetite: Good Sleep: No Change Vegetative Symptoms: None  ADLScreening [facility] Assessment Services) Patient's cognitive ability adequate to safely complete daily activities?: Yes Patient able to express need for assistance with ADLs?: Yes Independently performs ADLs?: Yes (appropriate for developmental age)  Abuse/Neglect [facility]) Physical Abuse: Denies Verbal  Abuse: Denies Sexual Abuse: Denies  Prior Inpatient Therapy Prior Inpatient Therapy: Yes Prior Therapy Dates: UNK Prior Therapy Facilty/Provider(s): [facility] Reason for Treatment: STABILIZATION  Prior Outpatient Therapy Prior Outpatient Therapy: Yes Prior Therapy Dates: CURRENT Prior Therapy Facilty/Provider(s): ACT with [facility] Reason for Treatment:  Herbalist; Therapy; Med Management)  ADL Screening (condition at time of admission) Patient's cognitive ability adequate to safely complete daily activities?: Yes Patient able to express need for assistance with ADLs?: Yes Independently performs ADLs?: Yes (appropriate for developmental age)       Abuse/Neglect Assessment (Assessment to be complete while patient is alone) Physical Abuse: Denies Verbal Abuse: Denies Sexual Abuse: Denies Values / Beliefs Cultural Requests During Hospitalization: None Spiritual Requests During Hospitalization: None        Additional Information 1:1 In Past 12 Months?: No CIRT Risk: No Elopement Risk: Yes Does patient have medical clearance?: Yes     Disposition:  Disposition Disposition of Patient: Referred to (Cleared by psych - return to [facility]) Patient referred to: Other (Comment) (Follow up with [facility] Care ACT)  On Site Evaluation by:   Reviewed with Physician:     Romeo Apple 11/04/2011 12:11 PM

## 2011-11-04 NOTE — ED Provider Notes (Signed)
12:23 PM The patient was seen by the psychiatrist and cleared for discharge.  He does not believe she is a threat to herself or to others.  Alcohol intoxication.  She is here at this time.  Please see his consultation note for full details.  Lyanne Co, MD 11/04/11 [phone]

## 2011-11-04 NOTE — Consult Note (Signed)
Reason for Consult: Alcohol intoxication and the substance induced mood disorder, negative attention seeking behavior Referring Physician: [name] is an 26 y.o. female.  HPI: Patient was seen and chart reviewed. Patient reported she was a drunk who with the 6 or 7 beers and than walk into the tropic and Bactrim. The prostate, and police came to her. Patient stated to the police that what, if I am, suicidal when police in from her. She will be charged for destruction of the traffic and may need to go to jail. Patient has a few negative behaviors during this visit and than apologetic for her behaviors and requested to be discharged as outpatient care. Patient required Geodon intramuscular injection when she started acting out. Patient blood alcohol level was 193 on arrival. Patient denied being depressed, anxious, manic or psychotic. Patient stated she has been living by herself in an apartment drinking beer daily and also watch TV. She has one friend, and she's been living on disability. Patient has been receiving outpatient psychiatric services for both medication management and the ACT services from [facility] care.  Past Medical History  Diagnosis Date  . Asthma   . Mental disorder PTSD Depression Schizophrenic disorder,  anxiety  . Depression   . Deliberate self-cutting   . PTSD (post-traumatic stress disorder)   . Bipolar 1 disorder     Past Surgical History  Procedure Date  . Foot surgery     Family History  Problem Relation Age of Onset  . Diabetes Other   . Depression Maternal Uncle     Social History:  reports that she has been smoking Cigarettes.  She has been smoking about .5 packs per day. She does not have any smokeless tobacco history on file. She reports that she drinks about 13.2 ounces of alcohol per week. She reports that she uses illicit drugs (Marijuana).  Allergies: No Known Allergies  Medications: I have reviewed the patient's current  medications.  Results for orders placed during the hospital encounter of 11/03/11 (from the past 48 hour(s))  URINE RAPID DRUG SCREEN (HOSP PERFORMED)     Status: Normal   Collection Time   11/03/11 11:59 PM      Component Value Range Comment   Opiates NONE DETECTED  NONE DETECTED    Cocaine NONE DETECTED  NONE DETECTED    Benzodiazepines NONE DETECTED  NONE DETECTED    Amphetamines NONE DETECTED  NONE DETECTED    Tetrahydrocannabinol NONE DETECTED  NONE DETECTED    Barbiturates NONE DETECTED  NONE DETECTED   POCT PREGNANCY, URINE     Status: Normal   Collection Time   11/04/11 12:05 AM      Component Value Range Comment   Preg Test, Ur NEGATIVE  NEGATIVE   CBC WITH DIFFERENTIAL     Status: Normal   Collection Time   11/04/11 12:10 AM      Component Value Range Comment   WBC 7.0  4.0 - 10.5 K/uL    RBC 4.69  3.87 - 5.11 MIL/uL    Hemoglobin 14.3  12.0 - 15.0 g/dL    HCT 40.9  81.1 - 91.4 %    MCV 87.4  78.0 - 100.0 fL    MCH 30.5  26.0 - 34.0 pg    MCHC 34.9  30.0 - 36.0 g/dL    RDW 78.2  95.6 - 21.3 %    Platelets 312  150 - 400 K/uL    Neutrophils Relative 58  43 -  77 %    Neutro Abs 4.1  1.7 - 7.7 K/uL    Lymphocytes Relative 34  12 - 46 %    Lymphs Abs 2.4  0.7 - 4.0 K/uL    Monocytes Relative 7  3 - 12 %    Monocytes Absolute 0.5  0.1 - 1.0 K/uL    Eosinophils Relative 0  0 - 5 %    Eosinophils Absolute 0.0  0.0 - 0.7 K/uL    Basophils Relative 1  0 - 1 %    Basophils Absolute 0.0  0.0 - 0.1 K/uL   COMPREHENSIVE METABOLIC PANEL     Status: Normal   Collection Time   11/04/11 12:10 AM      Component Value Range Comment   Sodium 136  135 - 145 mEq/L    Potassium 3.5  3.5 - 5.1 mEq/L    Chloride 101  96 - 112 mEq/L    CO2 20  19 - 32 mEq/L    Glucose, Bld 99  70 - 99 mg/dL    BUN 6  6 - 23 mg/dL    Creatinine, Ser 1.61  0.50 - 1.10 mg/dL    Calcium 9.6  8.4 - 09.6 mg/dL    Total Protein 7.3  6.0 - 8.3 g/dL    Albumin 4.3  3.5 - 5.2 g/dL    AST 15  0 - 37 U/L     ALT 7  0 - 35 U/L    Alkaline Phosphatase 52  39 - 117 U/L    Total Bilirubin 0.3  0.3 - 1.2 mg/dL    GFR calc non Af Amer >90  >90 mL/min    GFR calc Af Amer >90  >90 mL/min   ETHANOL     Status: Abnormal   Collection Time   11/04/11 12:10 AM      Component Value Range Comment   Alcohol, Ethyl (B) 193 (*) 0 - 11 mg/dL     No results found.  No depression, No anxiety, No psychosis and Positive for ADHD, behavior problems and excessive alcohol consumption Blood pressure 104/58, pulse 82, temperature 97.9 F (36.6 C), temperature source Oral, resp. rate 17, SpO2 99.00%.   Assessment/Plan: Substance-induced mood disorder. Mood disorder, not otherwise specified Attention deficit hyperactivity disorder by history.  Patient does not meet criteria for acute psychiatric hospitalization. Patient will be discharged to the outpatient psychiatric care at the [facility] and also receives ACT treatment services. No medication changes made during this visit.  Nichole Lopez,Nichole R. 11/04/2011, 11:46 AM

## 2011-11-04 NOTE — ED Notes (Signed)
I gave the patient potato chips

## 2012-07-24 ENCOUNTER — Emergency Department (HOSPITAL_COMMUNITY)
Admission: EM | Admit: 2012-07-24 | Discharge: 2012-07-24 | Disposition: A | Discharge status: Home / Self Care | Payer: Medicare Other | Attending: Emergency Medicine | Admitting: Emergency Medicine

## 2012-07-24 ENCOUNTER — Encounter (HOSPITAL_COMMUNITY): Payer: Self-pay | Admitting: *Deleted

## 2012-07-24 DIAGNOSIS — Z79899 Other long term (current) drug therapy: Secondary | ICD-10-CM | POA: Insufficient documentation

## 2012-07-24 DIAGNOSIS — F4310 Post-traumatic stress disorder, unspecified: Secondary | ICD-10-CM | POA: Insufficient documentation

## 2012-07-24 DIAGNOSIS — F319 Bipolar disorder, unspecified: Secondary | ICD-10-CM | POA: Insufficient documentation

## 2012-07-24 DIAGNOSIS — F489 Nonpsychotic mental disorder, unspecified: Secondary | ICD-10-CM | POA: Insufficient documentation

## 2012-07-24 DIAGNOSIS — Z8659 Personal history of other mental and behavioral disorders: Secondary | ICD-10-CM | POA: Insufficient documentation

## 2012-07-24 DIAGNOSIS — F10929 Alcohol use, unspecified with intoxication, unspecified: Secondary | ICD-10-CM

## 2012-07-24 DIAGNOSIS — F17200 Nicotine dependence, unspecified, uncomplicated: Secondary | ICD-10-CM | POA: Insufficient documentation

## 2012-07-24 DIAGNOSIS — F10229 Alcohol dependence with intoxication, unspecified: Secondary | ICD-10-CM | POA: Insufficient documentation

## 2012-07-24 DIAGNOSIS — F1210 Cannabis abuse, uncomplicated: Secondary | ICD-10-CM | POA: Insufficient documentation

## 2012-07-24 DIAGNOSIS — J45909 Unspecified asthma, uncomplicated: Secondary | ICD-10-CM | POA: Insufficient documentation

## 2012-07-24 LAB — POCT I-STAT, CHEM 8
Calcium, Ion: 1.13 mmol/L (ref 1.12–1.23)
Glucose, Bld: 85 mg/dL (ref 70–99)
HCT: 43 % (ref 36.0–46.0)
Hemoglobin: 14.6 g/dL (ref 12.0–15.0)
Potassium: 3.8 mEq/L (ref 3.5–5.1)

## 2012-07-24 MED ORDER — LORAZEPAM 2 MG/ML IJ SOLN
1.0000 mg | Freq: Once | INTRAMUSCULAR | Status: AC
  Administered 2012-07-24: 1 mg via INTRAMUSCULAR
  Filled 2012-07-24: qty 1

## 2012-07-24 MED ORDER — SODIUM CHLORIDE 0.9 % IV BOLUS (SEPSIS)
1000.0000 mL | Freq: Once | INTRAVENOUS | Status: AC
  Administered 2012-07-24: 1000 mL via INTRAVENOUS

## 2012-07-24 MED ORDER — ZIPRASIDONE MESYLATE 20 MG IM SOLR
20.0000 mg | Freq: Once | INTRAMUSCULAR | Status: AC
  Administered 2012-07-24: 20 mg via INTRAMUSCULAR
  Filled 2012-07-24: qty 20

## 2012-07-24 NOTE — ED Notes (Signed)
When attempted to ambulate pt, pt was very unsteady on her feet, stumbling around requiring two person assist to get back in bed.

## 2012-07-24 NOTE — ED Provider Notes (Signed)
History     CSN: [id number]  Arrival date & time 07/24/12  0415   First MD Initiated Contact with Patient 07/24/12 [phone]      Chief Complaint  Patient presents with  . Alcohol Intoxication    (Consider location/radiation/quality/duration/timing/severity/associated sxs/prior treatment) HPI Comments: Patient brought in to the ED today by [employer] due to alcohol intoxication.  She reports that she drank a 12 pack of beer last evening along with some liquor.  [employer] was apparently called because patient was found to be passed out on the sidewalk in front of her house.  Patient denies any pain or physical symptoms at this time.  She smokes Marijuana, but denies use of any other recreational drugs.  She denies SI or HI.  She reports that she does drink alcohol heavily regularly, but not daily. She does not desire any treatment for Alcoholism at this time.  The history is provided by the patient.    Past Medical History  Diagnosis Date  . Asthma   . Mental disorder PTSD Depression Schizophrenic disorder,  anxiety  . Depression   . Deliberate self-cutting   . PTSD (post-traumatic stress disorder)   . Bipolar 1 disorder     Past Surgical History  Procedure Laterality Date  . Foot surgery      Family History  Problem Relation Age of Onset  . Diabetes Other   . Depression Maternal Uncle     History  Substance Use Topics  . Smoking status: Current Every Day Smoker -- 0.50 packs/day    Types: Cigarettes  . Smokeless tobacco: Never Used  . Alcohol Use: 13.2 oz/week    12 Cans of beer, 10 Shots of liquor per week     Comment: Heavy    OB History   Grav Para Term Preterm Abortions TAB SAB Ect Mult Living                  Review of Systems  All other systems reviewed and are negative.    Allergies  Review of patient's allergies indicates no known allergies.  Home Medications   Current Outpatient Rx  Name  Route  Sig  Dispense  Refill  . ALPRAZolam (XANAX PO)   Oral   Take  1 tablet by mouth once.         . clonazePAM (KLONOPIN) 0.5 MG tablet   Oral   Take 0.5 mg by mouth 2 (two) times daily.         Marland Kitchen ibuprofen (ADVIL,MOTRIN) 200 MG tablet   Oral   Take 600 mg by mouth every 6 (six) hours as needed. For stomach ache         . traZODone (DESYREL) 100 MG tablet   Oral   Take 1 tablet (100 mg total) by mouth at bedtime. For sleep.   30 tablet   0   . traZODone (DESYREL) 100 MG tablet   Oral   Take 300 mg by mouth once.         . ziprasidone (GEODON) 40 MG capsule   Oral   Take 2 capsules (80 mg total) by mouth 2 (two) times daily with a meal. For mood stability.   60 capsule   0     BP 93/50  Pulse 84  Temp(Src) 97.8 F (36.6 C) (Oral)  Resp 18  SpO2 98%  Physical Exam  Nursing note and vitals reviewed. Constitutional: She appears well-developed and well-nourished. No distress.  HENT:  Head: Normocephalic and atraumatic.  Eyes: Pupils are equal, round, and reactive to light.  Horizontal nystagmus of both eyes  Neck: Normal range of motion. Neck supple.  Cardiovascular: Normal rate, regular rhythm and normal heart sounds.   Pulmonary/Chest: Effort normal and breath sounds normal.  Neurological: She is alert.  Skin: Skin is warm and dry. She is not diaphoretic.  Psychiatric: She has a normal mood and affect. She expresses no homicidal and no suicidal ideation. She expresses no suicidal plans and no homicidal plans.    ED Course  Procedures (including critical care time)  Labs Reviewed  ETHANOL   No results found.   No diagnosis found.  10:00 AM Patient ambulated.  Unsteady gait.  Will continue to monitor.  2:15 Pm Patient able to ambulate without difficulty.  VSS.  No complaints at this time.    MDM  Patient presents due to alcohol intoxication.  She denies any desire for alcohol detox.  Denies SI or HI.  Patient monitored in the ED.  Able to ambulate with stable vitals prior to discharge.        Pascal Lux [place], PA-C 07/25/12 [phone]

## 2012-07-24 NOTE — ED Notes (Signed)
Pt. Walk in the hall well but she was a little unsteady on her feet.

## 2012-07-24 NOTE — ED Notes (Signed)
Per EMS pt drank 14 beers in 4 hours, with some liquor in between, EMS stated pt can be violent, neighbors called d/t pt out on sidewalk passed out. BP 110/78, HR 76, RR 16.

## 2012-07-24 NOTE — ED Notes (Signed)
Pt crying and yelling for [employer] not to leave room, will not answer questions at this time.

## 2012-07-24 NOTE — ED Notes (Addendum)
Made Heather PA aware of pt not being able to ambulate and also when pt is deeply resting her BP drops below 90/60 but once pt is spoken to and repositioned BP returns back over 100/60

## 2012-07-24 NOTE — ED Notes (Signed)
Pt ran out of ED trying to leave, refused to come back to room, staff had to restrain pt to bring back to ED, pt yelling inappropriate things at staff, pt given medication at this time.

## 2012-07-25 NOTE — ED Provider Notes (Signed)
Medical screening examination/treatment/procedure(s) were performed by non-physician practitioner and as supervising physician I was immediately available for consultation/collaboration.  Dione Booze, MD 07/25/12 [phone]

## 2012-11-27 ENCOUNTER — Encounter (HOSPITAL_COMMUNITY): Payer: Self-pay | Admitting: Emergency Medicine

## 2012-11-27 ENCOUNTER — Encounter (HOSPITAL_COMMUNITY): Payer: Self-pay | Admitting: *Deleted

## 2012-11-27 ENCOUNTER — Inpatient Hospital Stay (HOSPITAL_COMMUNITY): Payer: Medicare Other

## 2012-11-27 ENCOUNTER — Emergency Department (INDEPENDENT_AMBULATORY_CARE_PROVIDER_SITE_OTHER)
Admission: EM | Admit: 2012-11-27 | Discharge: 2012-11-27 | Disposition: A | Discharge status: Nursing Home (Includes ICF) | Payer: Medicare Other | Source: Home / Self Care | Attending: Family Medicine | Admitting: Family Medicine

## 2012-11-27 ENCOUNTER — Inpatient Hospital Stay (HOSPITAL_COMMUNITY)
Admission: AD | Admit: 2012-11-27 | Discharge: 2012-11-27 | Disposition: A | Discharge status: Home / Self Care | Payer: Medicare Other | Source: Ambulatory Visit | Attending: Obstetrics & Gynecology | Admitting: Obstetrics & Gynecology

## 2012-11-27 DIAGNOSIS — Z331 Pregnant state, incidental: Secondary | ICD-10-CM

## 2012-11-27 DIAGNOSIS — Z3490 Encounter for supervision of normal pregnancy, unspecified, unspecified trimester: Secondary | ICD-10-CM

## 2012-11-27 DIAGNOSIS — O209 Hemorrhage in early pregnancy, unspecified: Secondary | ICD-10-CM | POA: Insufficient documentation

## 2012-11-27 DIAGNOSIS — N926 Irregular menstruation, unspecified: Secondary | ICD-10-CM

## 2012-11-27 DIAGNOSIS — O4690 Antepartum hemorrhage, unspecified, unspecified trimester: Secondary | ICD-10-CM

## 2012-11-27 DIAGNOSIS — R109 Unspecified abdominal pain: Secondary | ICD-10-CM | POA: Insufficient documentation

## 2012-11-27 LAB — CBC
MCH: 30.5 pg (ref 26.0–34.0)
MCHC: 34.1 g/dL (ref 30.0–36.0)
Platelets: 292 10*3/uL (ref 150–400)

## 2012-11-27 NOTE — ED Notes (Signed)
Called this pt in all waiting areas - no response.

## 2012-11-27 NOTE — MAU Note (Signed)
LAB IN TO DRAW BLOOD.

## 2012-11-27 NOTE — MAU Note (Signed)
Pt states she has a positive pregnancy test and has been bleeding on and off since last week

## 2012-11-27 NOTE — ED Notes (Signed)
Reports 8 home pregnancy test. Last cycle sept. 19. C/o mild pelvic cramping and light red spotting off/on. Denies n/v/d

## 2012-11-27 NOTE — MAU Note (Signed)
PT SAYS   SHE TOOK HOME PREG TEST ON Friday X8.-  ALL POSTIVE.      HAD SPOTTING-  STARTED LAST  Monday- AND MILD CRAMPING. THAT STARTED TODAY .   SAYS HAD TAMPON IN -  SMALL AMT  ON IT.   WENT TO URGENT CARE TONIGHT  TO CONFIRM PREG. -  POSITIVE UPT.     TOLD HER TO COME HERE.    LAST SEX- TODAY.     NO BIRTH CONTROL.     HAD PAP SMEAR  AT [facility] ON [address].- 2 YEARS  AGO.

## 2012-11-27 NOTE — ED Provider Notes (Signed)
CSN: [id number]     Arrival date & time 11/27/12  1839 History   First MD Initiated Contact with Patient 11/27/12 1942     No chief complaint on file.  (Consider location/radiation/quality/duration/timing/severity/associated sxs/prior Treatment) HPI Comments: 27 yo female with last menses 10/26/12 with 8 + pregnancy tests at home presents tonight with concerns of spotting on/ off x 2weeks and now with cramping.   Past Medical History  Diagnosis Date  . Asthma   . Mental disorder PTSD Depression Schizophrenic disorder,  anxiety  . Depression   . Deliberate self-cutting   . PTSD (post-traumatic stress disorder)   . Bipolar 1 disorder    Past Surgical History  Procedure Laterality Date  . Foot surgery     Family History  Problem Relation Age of Onset  . Diabetes Other   . Depression Maternal Uncle    History  Substance Use Topics  . Smoking status: Current Every Day Smoker -- 0.50 packs/day    Types: Cigarettes  . Smokeless tobacco: Never Used  . Alcohol Use: 13.2 oz/week    12 Cans of beer, 10 Shots of liquor per week     Comment: Heavy   OB History   Grav Para Term Preterm Abortions TAB SAB Ect Mult Living                 Review of Systems  Constitutional: Negative.   Respiratory: Negative.   Cardiovascular: Negative.   Gastrointestinal: Negative.   Genitourinary: Positive for vaginal bleeding.    Allergies  Review of patient's allergies indicates no known allergies.  Home Medications   Current Outpatient Rx  Name  Route  Sig  Dispense  Refill  . busPIRone (BUSPAR) 10 MG tablet   Oral   Take 10 mg by mouth 2 (two) times daily.         Marland Kitchen guanFACINE (INTUNIV) 1 MG TB24   Oral   Take 1 mg by mouth daily.         Marland Kitchen ibuprofen (ADVIL,MOTRIN) 200 MG tablet   Oral   Take 600 mg by mouth every 6 (six) hours as needed. For stomach ache         . traZODone (DESYREL) 100 MG tablet   Oral   Take 1 tablet (100 mg total) by mouth at bedtime. For sleep.  30 tablet   0   . ziprasidone (GEODON) 80 MG capsule   Oral   Take 160 mg by mouth at bedtime.          There were no vitals taken for this visit. Physical Exam  Nursing note and vitals reviewed. Constitutional: She appears well-developed and well-nourished.  Cardiovascular: Normal rate, regular rhythm, normal heart sounds and intact distal pulses.   Pulmonary/Chest: Effort normal and breath sounds normal.  Abdominal: Soft. Bowel sounds are normal.    ED Course  Procedures (including critical care time) Labs Review Labs Reviewed - No data to display Imaging Review No results found.    MDM  Transfer Patient to [facility] for further evaluation to evaluate abnormal spotting and R/O Miscarriage vs Ectopic Patient with LMP 10/26/12 with multiple + pregnancy tests at home and + at [facility]. Spotting on/off x 2weeks now with cramping.     Berenice Primas, PA-C 11/27/12 2056

## 2012-11-27 NOTE — MAU Provider Note (Signed)
History     CSN: [id number]  Arrival date and time: 11/27/12 2149   None     No chief complaint on file.  HPI  Nichole Lopez is a 27 y.o. G1P0 who was sent over from urgent care for evaluation of early pregnancy bleeding. She states that she had a positive pregnancy test, and started bleeding about a week ago.   Past Medical History  Diagnosis Date  . Asthma   . Mental disorder PTSD Depression Schizophrenic disorder,  anxiety  . Depression   . Deliberate self-cutting   . PTSD (post-traumatic stress disorder)   . Bipolar 1 disorder     Past Surgical History  Procedure Laterality Date  . Foot surgery      Family History  Problem Relation Age of Onset  . Diabetes Other   . Depression Maternal Uncle     History  Substance Use Topics  . Smoking status: Current Every Day Smoker -- 0.50 packs/day    Types: Cigarettes  . Smokeless tobacco: Never Used  . Alcohol Use: 13.2 oz/week    12 Cans of beer, 10 Shots of liquor per week     Comment: Heavy    Allergies: No Known Allergies  Prescriptions prior to admission  Medication Sig Dispense Refill  . busPIRone (BUSPAR) 10 MG tablet Take 10 mg by mouth 2 (two) times daily.      Marland Kitchen guanFACINE (INTUNIV) 1 MG TB24 Take 1 mg by mouth daily.      Marland Kitchen ibuprofen (ADVIL,MOTRIN) 200 MG tablet Take 600 mg by mouth every 6 (six) hours as needed. For stomach ache      . traZODone (DESYREL) 100 MG tablet Take 1 tablet (100 mg total) by mouth at bedtime. For sleep.  30 tablet  0  . ziprasidone (GEODON) 80 MG capsule Take 160 mg by mouth at bedtime.        ROS Physical Exam   Blood pressure 127/92, pulse 74, temperature 97.8 F (36.6 C), temperature source Oral, resp. rate 18, height 5\' 7"  (1.702 m), weight 90.946 kg (200 lb 8 oz), last menstrual period 10/26/2012.  Physical Exam  Nursing note and vitals reviewed. Constitutional: She is oriented to person, place, and time. She appears well-developed and well-nourished. No  distress.  Cardiovascular: Normal rate.   Respiratory: Effort normal.  GI: Soft. There is no tenderness.  Neurological: She is alert and oriented to person, place, and time.  Skin: Skin is warm and dry.  Psychiatric: She has a normal mood and affect.    MAU Course  Procedures  Results for orders placed during the hospital encounter of 11/27/12 (from the past 24 hour(s))  CBC     Status: None   Collection Time    11/27/12 10:00 PM      Result Value Range   WBC 8.5  4.0 - 10.5 K/uL   RBC 4.52  3.87 - 5.11 MIL/uL   Hemoglobin 13.8  12.0 - 15.0 g/dL   HCT 95.6  21.3 - 08.6 %   MCV 89.6  78.0 - 100.0 fL   MCH 30.5  26.0 - 34.0 pg   MCHC 34.1  30.0 - 36.0 g/dL   RDW 57.8  46.9 - 62.9 %   Platelets 292  150 - 400 K/uL  ABO/RH     Status: None   Collection Time    11/27/12 10:00 PM      Result Value Range   ABO/RH(D) A POS    HCG, QUANTITATIVE, PREGNANCY  Status: Abnormal   Collection Time    11/27/12 10:00 PM      Result Value Range   hCG, Beta Chain, Quant, S 44 (*) <5 mIU/mL    US Ob Comp Less 14 Wks  11/27/2012   CLINICAL DATA:  Bleeding and cramping. Quantitative beta HCG level is pending. Estimated gestational age by LMP is 4 weeks 4 days.  EXAM: OBSTETRIC <14 WK [place] AND TRANSVAGINAL OB US  TECHNIQUE: Both transabdominal and transvaginal ultrasound examinations were performed for complete evaluation of the gestation as well as the maternal uterus, adnexal regions, and pelvic cul-de-sac. Transvaginal technique was performed to assess early pregnancy.  COMPARISON:  None.  FINDINGS: Intrauterine gestational sac: No intrauterine gestational sac is visualized.  Yolk sac:  Not visualized  Embryo:  Not visualized  Cardiac Activity: Not visualized  Maternal uterus/adnexae: The uterus is retroverted. No myometrial mass lesions. Endometrial stripe thickness is normal. No endometrial fluid collections. The left ovary measures 2.9 x 1.7 x 2.3 cm. Normal follicular changes are  demonstrated. The right ovary measures 2.1 x 3.4 x 2.2 cm. Normal follicular changes. Small paraovarian cyst on the right measuring about 1 cm diameter. This appears to be a simple cyst. No flow is demonstrated on color flow Doppler imaging.  IMPRESSION: No intrauterine pregnancy is demonstrated. Correlation with the beta HCG is recommended. If the pregnancy test is positive, findings could be due to early pregnancy which is too small to see, missed spontaneous abortion, or ectopic pregnancy. Recommend followup with serial quantitative beta HCG levels and/or short-term ultrasound followup in 10-14 days as clinically indicated.   Electronically Signed   By: Burman Nieves M.D.   On: 11/27/2012 22:52   US Ob Transvaginal  11/27/2012   CLINICAL DATA:  Bleeding and cramping. Quantitative beta HCG level is pending. Estimated gestational age by LMP is 4 weeks 4 days.  EXAM: OBSTETRIC <14 WK [place] AND TRANSVAGINAL OB US  TECHNIQUE: Both transabdominal and transvaginal ultrasound examinations were performed for complete evaluation of the gestation as well as the maternal uterus, adnexal regions, and pelvic cul-de-sac. Transvaginal technique was performed to assess early pregnancy.  COMPARISON:  None.  FINDINGS: Intrauterine gestational sac: No intrauterine gestational sac is visualized.  Yolk sac:  Not visualized  Embryo:  Not visualized  Cardiac Activity: Not visualized  Maternal uterus/adnexae: The uterus is retroverted. No myometrial mass lesions. Endometrial stripe thickness is normal. No endometrial fluid collections. The left ovary measures 2.9 x 1.7 x 2.3 cm. Normal follicular changes are demonstrated. The right ovary measures 2.1 x 3.4 x 2.2 cm. Normal follicular changes. Small paraovarian cyst on the right measuring about 1 cm diameter. This appears to be a simple cyst. No flow is demonstrated on color flow Doppler imaging.  IMPRESSION: No intrauterine pregnancy is demonstrated. Correlation with the beta HCG is  recommended. If the pregnancy test is positive, findings could be due to early pregnancy which is too small to see, missed spontaneous abortion, or ectopic pregnancy. Recommend followup with serial quantitative beta HCG levels and/or short-term ultrasound followup in 10-14 days as clinically indicated.   Electronically Signed   By: Burman Nieves M.D.   On: 11/27/2012 22:52     Assessment and Plan   1. Vaginal bleeding in pregnancy, first trimester    Ectopic precautions given FU in 48 hours for repeat HCG  Tawnya Crook 11/27/2012, 11:01 PM

## 2012-11-28 LAB — ABO/RH: ABO/RH(D): A POS

## 2012-11-28 NOTE — ED Provider Notes (Signed)
Medical screening examination/treatment/procedure(s) were performed by resident physician or non-physician practitioner and as supervising physician I was immediately available for consultation/collaboration.   Marivel Mcclarty DOUGLAS MD.   Rosaelena Kemnitz D Glorimar Stroope, MD 11/28/12 2120 

## 2012-11-29 ENCOUNTER — Inpatient Hospital Stay (HOSPITAL_COMMUNITY)
Admission: AD | Admit: 2012-11-29 | Discharge: 2012-11-29 | Disposition: A | Discharge status: Home / Self Care | Payer: Medicare Other | Source: Ambulatory Visit | Attending: Obstetrics & Gynecology | Admitting: Obstetrics & Gynecology

## 2012-11-29 DIAGNOSIS — O039 Complete or unspecified spontaneous abortion without complication: Secondary | ICD-10-CM

## 2012-11-29 NOTE — MAU Provider Note (Signed)
History   Chief Complaint:  No chief complaint on file.   Nichole Lopez is  27 y.o. G1P0 Patient's last menstrual period was 10/26/2012.Nichole Lopez Patient is here for follow up of quantitative HCG and ongoing surveillance of pregnancy status.   She is Unknown weeks gestation.    Since her last visit, the patient is with new complaint.   The patient reports bleeding as  similar to period.  Mild cramping.  General ROS:  negative  Her previous Quantitative HCG values are:  10-21-201 44   Physical Exam   Last menstrual period 10/26/2012.  Focused Gynecological Exam: examination not indicated  Labs: Results for orders placed during the hospital encounter of 11/29/12 (from the past 24 hour(s))  HCG, QUANTITATIVE, PREGNANCY   Collection Time    11/29/12  9:17 PM      Result Value Range   hCG, Beta Chain, Quant, S 12 (*) <5 mIU/mL    Ultrasound Studies:   US Ob Comp Less 14 Wks  11/27/2012   CLINICAL DATA:  Bleeding and cramping. Quantitative beta HCG level is pending. Estimated gestational age by LMP is 4 weeks 4 days.  EXAM: OBSTETRIC <14 WK [place] AND TRANSVAGINAL OB US  TECHNIQUE: Both transabdominal and transvaginal ultrasound examinations were performed for complete evaluation of the gestation as well as the maternal uterus, adnexal regions, and pelvic cul-de-sac. Transvaginal technique was performed to assess early pregnancy.  COMPARISON:  None.  FINDINGS: Intrauterine gestational sac: No intrauterine gestational sac is visualized.  Yolk sac:  Not visualized  Embryo:  Not visualized  Cardiac Activity: Not visualized  Maternal uterus/adnexae: The uterus is retroverted. No myometrial mass lesions. Endometrial stripe thickness is normal. No endometrial fluid collections. The left ovary measures 2.9 x 1.7 x 2.3 cm. Normal follicular changes are demonstrated. The right ovary measures 2.1 x 3.4 x 2.2 cm. Normal follicular changes. Small paraovarian cyst on the right measuring about 1 cm diameter.  This appears to be a simple cyst. No flow is demonstrated on color flow Doppler imaging.  IMPRESSION: No intrauterine pregnancy is demonstrated. Correlation with the beta HCG is recommended. If the pregnancy test is positive, findings could be due to early pregnancy which is too small to see, missed spontaneous abortion, or ectopic pregnancy. Recommend followup with serial quantitative beta HCG levels and/or short-term ultrasound followup in 10-14 days as clinically indicated.   Electronically Signed   By: Burman Nieves M.D.   On: 11/27/2012 22:52   US Ob Transvaginal  11/27/2012   CLINICAL DATA:  Bleeding and cramping. Quantitative beta HCG level is pending. Estimated gestational age by LMP is 4 weeks 4 days.  EXAM: OBSTETRIC <14 WK [place] AND TRANSVAGINAL OB US  TECHNIQUE: Both transabdominal and transvaginal ultrasound examinations were performed for complete evaluation of the gestation as well as the maternal uterus, adnexal regions, and pelvic cul-de-sac. Transvaginal technique was performed to assess early pregnancy.  COMPARISON:  None.  FINDINGS: Intrauterine gestational sac: No intrauterine gestational sac is visualized.  Yolk sac:  Not visualized  Embryo:  Not visualized  Cardiac Activity: Not visualized  Maternal uterus/adnexae: The uterus is retroverted. No myometrial mass lesions. Endometrial stripe thickness is normal. No endometrial fluid collections. The left ovary measures 2.9 x 1.7 x 2.3 cm. Normal follicular changes are demonstrated. The right ovary measures 2.1 x 3.4 x 2.2 cm. Normal follicular changes. Small paraovarian cyst on the right measuring about 1 cm diameter. This appears to be a simple cyst. No flow is demonstrated on  color flow Doppler imaging.  IMPRESSION: No intrauterine pregnancy is demonstrated. Correlation with the beta HCG is recommended. If the pregnancy test is positive, findings could be due to early pregnancy which is too small to see, missed spontaneous abortion, or  ectopic pregnancy. Recommend followup with serial quantitative beta HCG levels and/or short-term ultrasound followup in 10-14 days as clinically indicated.   Electronically Signed   By: Burman Nieves M.D.   On: 11/27/2012 22:52    Assessment: SAB  Plan: D/C home in stable condition. Support given. Declines Chaplain. Follow-up Information   Follow up with [facility] In 1 week. (for follow-up visit and bloodwork or as needed if symptoms worsen)    Specialty:  Obstetrics and Gynecology   Contact information:   [address] [place] [address] [phone]      Follow up with [facility] [place] MATERNITY ADMISSIONS. (As needed in emergencies)    Contact information:   [address] [id number] [place] [address] [phone]       Medication List         acetaminophen 500 MG tablet  Commonly known as:  TYLENOL  Take 1,500 mg by mouth every 6 (six) hours as needed for pain (For cramping.).     citalopram 20 MG tablet  Commonly known as:  CELEXA  Take 20 mg by mouth daily.     traZODone 100 MG tablet  Commonly known as:  DESYREL  Take 1 tablet (100 mg total) by mouth at bedtime. For sleep.        Nichole Lopez 11/29/2012, 9:13 PM

## 2012-11-29 NOTE — MAU Note (Signed)
Here for follow up BHCG. States she feels like she started her period today, states has changed her pad x 2 today, cramping like with her periods.

## 2012-11-30 ENCOUNTER — Encounter (HOSPITAL_COMMUNITY): Payer: Self-pay | Admitting: Emergency Medicine

## 2012-11-30 ENCOUNTER — Emergency Department (HOSPITAL_COMMUNITY)
Admission: EM | Admit: 2012-11-30 | Discharge: 2012-11-30 | Disposition: A | Discharge status: Other Acute Inpatient Hospital | Payer: Medicare Other | Attending: Emergency Medicine | Admitting: Emergency Medicine

## 2012-11-30 DIAGNOSIS — F1010 Alcohol abuse, uncomplicated: Secondary | ICD-10-CM | POA: Insufficient documentation

## 2012-11-30 DIAGNOSIS — F17200 Nicotine dependence, unspecified, uncomplicated: Secondary | ICD-10-CM | POA: Insufficient documentation

## 2012-11-30 DIAGNOSIS — F10929 Alcohol use, unspecified with intoxication, unspecified: Secondary | ICD-10-CM

## 2012-11-30 DIAGNOSIS — Z79899 Other long term (current) drug therapy: Secondary | ICD-10-CM | POA: Insufficient documentation

## 2012-11-30 DIAGNOSIS — F603 Borderline personality disorder: Secondary | ICD-10-CM

## 2012-11-30 DIAGNOSIS — R45851 Suicidal ideations: Secondary | ICD-10-CM | POA: Insufficient documentation

## 2012-11-30 DIAGNOSIS — F4310 Post-traumatic stress disorder, unspecified: Secondary | ICD-10-CM

## 2012-11-30 DIAGNOSIS — F319 Bipolar disorder, unspecified: Secondary | ICD-10-CM | POA: Insufficient documentation

## 2012-11-30 DIAGNOSIS — F332 Major depressive disorder, recurrent severe without psychotic features: Secondary | ICD-10-CM | POA: Diagnosis present

## 2012-11-30 DIAGNOSIS — F1020 Alcohol dependence, uncomplicated: Secondary | ICD-10-CM

## 2012-11-30 DIAGNOSIS — F411 Generalized anxiety disorder: Secondary | ICD-10-CM

## 2012-11-30 DIAGNOSIS — Z3202 Encounter for pregnancy test, result negative: Secondary | ICD-10-CM | POA: Insufficient documentation

## 2012-11-30 DIAGNOSIS — J45909 Unspecified asthma, uncomplicated: Secondary | ICD-10-CM | POA: Insufficient documentation

## 2012-11-30 LAB — ACETAMINOPHEN LEVEL: Acetaminophen (Tylenol), Serum: <15 ug/mL (ref 10–30)

## 2012-11-30 LAB — RAPID URINE DRUG SCREEN, HOSP PERFORMED
Amphetamines: NOT DETECTED
Barbiturates: NOT DETECTED
Cocaine: NOT DETECTED
Opiates: NOT DETECTED
Tetrahydrocannabinol: NOT DETECTED

## 2012-11-30 LAB — COMPREHENSIVE METABOLIC PANEL
ALT: 10 U/L (ref 0–35)
Alkaline Phosphatase: 47 U/L (ref 39–117)
BUN: 8 mg/dL (ref 6–23)
CO2: 20 mEq/L (ref 19–32)
Chloride: 102 mEq/L (ref 96–112)
GFR calc Af Amer: >90 mL/min (ref >90)
GFR calc non Af Amer: >90 mL/min (ref >90)
Glucose, Bld: 93 mg/dL (ref 70–99)
Potassium: 3.6 mEq/L (ref 3.5–5.1)
Sodium: 136 mEq/L (ref 135–145)
Total Bilirubin: 0.2 mg/dL — ABNORMAL LOW (ref 0.3–1.2)
Total Protein: 7.5 g/dL (ref 6.0–8.3)

## 2012-11-30 LAB — CBC
HCT: 41.5 % (ref 36.0–46.0)
Hemoglobin: 14.4 g/dL (ref 12.0–15.0)
MCH: 30.9 pg (ref 26.0–34.0)
MCV: 89.1 fL (ref 78.0–100.0)
RBC: 4.66 MIL/uL (ref 3.87–5.11)
WBC: 8.8 10*3/uL (ref 4.0–10.5)

## 2012-11-30 LAB — POCT PREGNANCY, URINE: Preg Test, Ur: NEGATIVE

## 2012-11-30 MED ORDER — DIPHENHYDRAMINE HCL 50 MG/ML IJ SOLN: 25.0000 mg | Freq: Once | INTRAMUSCULAR | Status: DC

## 2012-11-30 MED ORDER — ONDANSETRON HCL 4 MG PO TABS: 4.0000 mg | ORAL_TABLET | Freq: Three times a day (TID) | ORAL | Status: DC | PRN

## 2012-11-30 MED ORDER — LORAZEPAM 1 MG PO TABS: 1.0000 mg | ORAL_TABLET | Freq: Three times a day (TID) | ORAL | Status: DC | PRN

## 2012-11-30 MED ORDER — TRAZODONE HCL 100 MG PO TABS: 100.0000 mg | ORAL_TABLET | Freq: Every day | ORAL | Status: DC

## 2012-11-30 MED ORDER — ALUM & MAG HYDROXIDE-SIMETH 200-200-20 MG/5ML PO SUSP: 30.0000 mL | ORAL | Status: DC | PRN

## 2012-11-30 MED ORDER — HYDROXYZINE HCL 25 MG PO TABS
25.0000 mg | ORAL_TABLET | Freq: Three times a day (TID) | ORAL | Status: DC | PRN
  Administered 2012-11-30: 25 mg via ORAL
  Filled 2012-11-30: qty 1

## 2012-11-30 MED ORDER — ZIPRASIDONE MESYLATE 20 MG IM SOLR: 20.0000 mg | Freq: Once | INTRAMUSCULAR | Status: DC

## 2012-11-30 MED ORDER — CITALOPRAM HYDROBROMIDE 20 MG PO TABS
20.0000 mg | ORAL_TABLET | Freq: Every day | ORAL | Status: DC
  Administered 2012-11-30: 20 mg via ORAL
  Filled 2012-11-30 (×2): qty 1

## 2012-11-30 MED ORDER — ZOLPIDEM TARTRATE 5 MG PO TABS: 5.0000 mg | ORAL_TABLET | Freq: Every evening | ORAL | Status: DC | PRN

## 2012-11-30 MED ORDER — NICOTINE 14 MG/24HR TD PT24
14.0000 mg | MEDICATED_PATCH | Freq: Once | TRANSDERMAL | Status: DC
  Administered 2012-11-30: 14 mg via TRANSDERMAL
  Filled 2012-11-30: qty 1

## 2012-11-30 MED ORDER — ZIPRASIDONE MESYLATE 20 MG IM SOLR
10.0000 mg | Freq: Once | INTRAMUSCULAR | Status: AC
  Administered 2012-11-30: 10 mg via INTRAMUSCULAR
  Filled 2012-11-30: qty 20

## 2012-11-30 MED ORDER — IBUPROFEN 200 MG PO TABS: 600.0000 mg | ORAL_TABLET | Freq: Three times a day (TID) | ORAL | Status: DC | PRN

## 2012-11-30 NOTE — ED Provider Notes (Signed)
Medical screening examination/treatment/procedure(s) were performed by non-physician practitioner and as supervising physician I was immediately available for consultation/collaboration.   Derwood Kaplan, MD 11/30/12 [phone]

## 2012-11-30 NOTE — ED Provider Notes (Signed)
CSN: [id number]     Arrival date & time 11/30/12  0255 History   First MD Initiated Contact with Patient 11/30/12 [phone]     Chief Complaint  Patient presents with  . Medical Clearance   level V cavity applies secondary to patient cooperativeness  HPI  History provided by the patient and [employer].  Patient is a 27 year old female with history of PTSD, schizophrenic disorder, anxiety and depression who presents with suicidal ideations. Patient states that she is very sad and depressed secondary to her recent miscarriage. She states that she was approximately [redacted] weeks pregnant and was told by another doctor that she had a miscarriage. Tonight she states that she was sad and called the crisis help line. Please arrived to her home and the patient states that she was deliberately confrontational because she wanted the police to shoot her. She does admit to some alcohol use this evening. Denies any other drug use. Denies any other plan. No other complaints.  Past Medical History  Diagnosis Date  . Asthma   . Mental disorder PTSD Depression Schizophrenic disorder,  anxiety  . Depression   . Deliberate self-cutting   . PTSD (post-traumatic stress disorder)   . Bipolar 1 disorder    Past Surgical History  Procedure Laterality Date  . Foot surgery     Family History  Problem Relation Age of Onset  . Diabetes Other   . Depression Maternal Uncle    History  Substance Use Topics  . Smoking status: Current Every Day Smoker -- 0.50 packs/day    Types: Cigarettes  . Smokeless tobacco: Never Used  . Alcohol Use: 13.2 oz/week    12 Cans of beer, 10 Shots of liquor per week     Comment: Heavy   OB History   Grav Para Term Preterm Abortions TAB SAB Ect Mult Living   1              Review of Systems  Unable to perform ROS: Other  Constitutional: Negative for fever, chills and diaphoresis.  Respiratory: Negative for shortness of breath.   Cardiovascular: Negative for chest pain.  Neurological:  Negative for headaches.  Psychiatric/Behavioral: Positive for suicidal ideas and dysphoric mood.  All other systems reviewed and are negative.    Allergies  Review of patient's allergies indicates no known allergies.  Home Medications   Current Outpatient Rx  Name  Route  Sig  Dispense  Refill  . acetaminophen (TYLENOL) 500 MG tablet   Oral   Take 1,500 mg by mouth every 6 (six) hours as needed for pain (For cramping.).         Marland Kitchen citalopram (CELEXA) 20 MG tablet   Oral   Take 20 mg by mouth daily.         . traZODone (DESYREL) 100 MG tablet   Oral   Take 1 tablet (100 mg total) by mouth at bedtime. For sleep.   30 tablet   0    BP 115/76  Pulse 89  Temp(Src) 98.3 F (36.8 C) (Oral)  Resp 18  Ht 5\' 9"  (1.753 m)  SpO2 99%  LMP 10/26/2012 Physical Exam  Nursing note and vitals reviewed. Constitutional: She is oriented to person, place, and time. She appears well-developed and well-nourished. No distress.  HENT:  Head: Normocephalic and atraumatic.  Eyes: Conjunctivae and EOM are normal. Pupils are equal, round, and reactive to light.  Cardiovascular: Normal rate and regular rhythm.   No murmur heard. Pulmonary/Chest: Effort normal and  breath sounds normal. No respiratory distress. She has no wheezes. She has no rales.  Abdominal: Soft. There is no tenderness. There is no rebound and no guarding.  Musculoskeletal: Normal range of motion.  Neurological: She is alert and oriented to person, place, and time.  Skin: Skin is warm and dry. No rash noted.  Psychiatric: She has a normal mood and affect. Her behavior is normal.    ED Course  Procedures    Patient seen and evaluated. She has been very confrontational and aggressive toward staff. She has been hitting and kicking at police officers. She was restrained with physical restraints and Geodon ordered.  Review of her past lab tests do show that she had a positive urine pregnancy and quantitative beta hCG of 44  on October 21. On October 23 her hCG had dropped to 12  Psych holding orders in place.  TTS consult made.   Results for orders placed during the hospital encounter of 11/30/12  ACETAMINOPHEN LEVEL      Result Value Range   Acetaminophen (Tylenol), Serum <15.0  10 - 30 ug/mL  CBC      Result Value Range   WBC 8.8  4.0 - 10.5 K/uL   RBC 4.66  3.87 - 5.11 MIL/uL   Hemoglobin 14.4  12.0 - 15.0 g/dL   HCT 16.1  09.6 - 04.5 %   MCV 89.1  78.0 - 100.0 fL   MCH 30.9  26.0 - 34.0 pg   MCHC 34.7  30.0 - 36.0 g/dL   RDW 40.9  81.1 - 91.4 %   Platelets 336  150 - 400 K/uL  COMPREHENSIVE METABOLIC PANEL      Result Value Range   Sodium 136  135 - 145 mEq/L   Potassium 3.6  3.5 - 5.1 mEq/L   Chloride 102  96 - 112 mEq/L   CO2 20  19 - 32 mEq/L   Glucose, Bld 93  70 - 99 mg/dL   BUN 8  6 - 23 mg/dL   Creatinine, Ser 7.82  0.50 - 1.10 mg/dL   Calcium 9.9  8.4 - 95.6 mg/dL   Total Protein 7.5  6.0 - 8.3 g/dL   Albumin 4.4  3.5 - 5.2 g/dL   AST 16  0 - 37 U/L   ALT 10  0 - 35 U/L   Alkaline Phosphatase 47  39 - 117 U/L   Total Bilirubin 0.2 (*) 0.3 - 1.2 mg/dL   GFR calc non Af Amer >90  >90 mL/min   GFR calc Af Amer >90  >90 mL/min  ETHANOL      Result Value Range   Alcohol, Ethyl (B) 198 (*) 0 - 11 mg/dL  SALICYLATE LEVEL      Result Value Range   Salicylate Lvl <2.0 (*) 2.8 - 20.0 mg/dL  URINE RAPID DRUG SCREEN (HOSP PERFORMED)      Result Value Range   Opiates NONE DETECTED  NONE DETECTED   Cocaine NONE DETECTED  NONE DETECTED   Benzodiazepines NONE DETECTED  NONE DETECTED   Amphetamines NONE DETECTED  NONE DETECTED   Tetrahydrocannabinol NONE DETECTED  NONE DETECTED   Barbiturates NONE DETECTED  NONE DETECTED  HCG, QUANTITATIVE, PREGNANCY      Result Value Range   hCG, Beta Chain, Quant, S 11 (*) <5 mIU/mL  POCT PREGNANCY, URINE      Result Value Range   Preg Test, Ur NEGATIVE  NEGATIVE      MDM  1. Suicidal ideation   2. Alcohol intoxication        Angus Seller, PA-C 11/30/12 [phone]

## 2012-11-30 NOTE — BH Assessment (Signed)
[facility] Assessment Progress Note Called Occupational psychologist, Clydie Braun, and scheduled pt's tele assessment for 0800.

## 2012-11-30 NOTE — MAU Provider Note (Signed)
Attestation of Attending Supervision of Advanced Practitioner (CNM/NP): Evaluation and management procedures were performed by the Advanced Practitioner under my supervision and collaboration.  I have reviewed the Advanced Practitioner's note and chart, and I agree with the management and plan.  HARRAWAY-SMITH, Rosalena Mccorry 12:07 PM

## 2012-11-30 NOTE — Progress Notes (Addendum)
Pt declined at [facility] due to aggressive behavior per voicemail message from [facility].  Per CC @ [name] pt declined related to medical reasons concerning miscarriage.  Per Britta Mccreedy @ [facility] pt declined due primary substance abuse.  Per Dondra Spry at [facility] pt is still pending review.  Per [facility], they do not have an appropriate bed for pt at this time but will keep referral and if an appropriate bed becomes available then they will call back.  Per [facility], patient still pending review and Weekend CSW should follow up with nurse Elmarie Shiley after 7:30 am  Per [facility] pt is still pending review.  Marva Panda, Theresia Majors  [phone] .11/30/2012

## 2012-11-30 NOTE — Consult Note (Signed)
[facility] Face-to-Face Psychiatry Consult   Reason for Consult:  Depression Referring Physician:  ED MD  Nichole Lopez is an 27 y.o. female.  Assessment: AXIS I:  Anxiety Disorder NOS, Major Depression, Recurrent severe and Post Traumatic Stress Disorder AXIS II:  Deferred AXIS III:   Past Medical History  Diagnosis Date  . Asthma   . Mental disorder PTSD Depression Schizophrenic disorder,  anxiety  . Depression   . Deliberate self-cutting   . PTSD (post-traumatic stress disorder)   . Bipolar 1 disorder    AXIS IV:  other psychosocial or environmental problems, problems related to social environment and problems with primary support group AXIS V:  41-50 serious symptoms  Plan:  Recommend psychiatric Inpatient admission when medically cleared.  Subjective:   Nichole Lopez is a 27 y.o. female patient admitted with depression and suicide attempt, admit to [facility] hall.  HPI:  Patient presents with depressed mood and affect, miscarried yesterday and had been trying for almost seven years.  She lives alone, her mother died when she was 17 "in her sleep" and raised by her aunt and uncle who she does not get along with.  Her dad died from an overdose and was not involved in their lives.  She has a 40 yo sister who is supportive.  Nichole Lopez reports a Bipolar disorder but only takes Celexa and Trazodone.  She continues to be suicidal today and hurt herself by using broken glass to cut her forearms. HPI Elements:   Location:  generalized. Quality:  acute. Severity:  severe. Timing:  constant. Duration:  24 hours. Context:  miscarriage yesterday.  Past Psychiatric History: Past Medical History  Diagnosis Date  . Asthma   . Mental disorder PTSD Depression Schizophrenic disorder,  anxiety  . Depression   . Deliberate self-cutting   . PTSD (post-traumatic stress disorder)   . Bipolar 1 disorder     reports that she has been smoking Cigarettes.  She has been smoking about 0.50 packs per  day. She has never used smokeless tobacco. She reports that she drinks about 13.2 ounces of alcohol per week. She reports that she uses illicit drugs (Marijuana). Family History  Problem Relation Age of Onset  . Diabetes Other   . Depression Maternal Uncle    Family History Substance Abuse: No Family Supports: No Living Arrangements: Alone Can pt return to current living arrangement?: Yes Abuse/Neglect [facility]) Physical Abuse: Denies Verbal Abuse: Denies Sexual Abuse: Denies Allergies:  No Known Allergies  ACT Assessment Complete:  No:   Past Psychiatric History: Diagnosis:  Bipolar, depression, anxiety, PTSD  Substance Abuse Care:  None  Self-Mutilation:  Cutting   Suicidal Attempts:  Cutting  Homicidal Behaviors:  None   Violent Behaviors:  None   Place of Residence:  [place] Marital Status:  Single  Employed/Unemployed:  Disability for psychiatric issues Education:  High school Family Supports:  57 yo sister Objective: Blood pressure 99/60, pulse 67, temperature 98.3 F (36.8 C), temperature source Oral, resp. rate 16, height 5\' 9"  (1.753 m), last menstrual period 10/26/2012, SpO2 97.00%.There is no weight on file to calculate BMI. Results for orders placed during the hospital encounter of 11/30/12 (from the past 72 hour(s))  URINE RAPID DRUG SCREEN (HOSP PERFORMED)     Status: None   Collection Time    11/30/12  3:12 AM      Result Value Range   Opiates NONE DETECTED  NONE DETECTED   Cocaine NONE DETECTED  NONE DETECTED   Benzodiazepines  NONE DETECTED  NONE DETECTED   Amphetamines NONE DETECTED  NONE DETECTED   Tetrahydrocannabinol NONE DETECTED  NONE DETECTED   Barbiturates NONE DETECTED  NONE DETECTED   Comment:            DRUG SCREEN FOR MEDICAL PURPOSES     ONLY.  IF CONFIRMATION IS NEEDED     FOR ANY PURPOSE, NOTIFY LAB     WITHIN 5 DAYS.                LOWEST DETECTABLE LIMITS     FOR URINE DRUG SCREEN     Drug Class       Cutoff (ng/mL)     Amphetamine       1000     Barbiturate      200     Benzodiazepine   200     Tricyclics       300     Opiates          300     Cocaine          300     THC              50  ACETAMINOPHEN LEVEL     Status: None   Collection Time    11/30/12  3:15 AM      Result Value Range   Acetaminophen (Tylenol), Serum <15.0  10 - 30 ug/mL   Comment:            THERAPEUTIC CONCENTRATIONS VARY     SIGNIFICANTLY. A RANGE OF 10-30     ug/mL MAY BE AN EFFECTIVE     CONCENTRATION FOR MANY PATIENTS.     HOWEVER, SOME ARE BEST TREATED     AT CONCENTRATIONS OUTSIDE THIS     RANGE.     ACETAMINOPHEN CONCENTRATIONS     >150 ug/mL AT 4 HOURS AFTER     INGESTION AND >50 ug/mL AT 12     HOURS AFTER INGESTION ARE     OFTEN ASSOCIATED WITH TOXIC     REACTIONS.  CBC     Status: None   Collection Time    11/30/12  3:15 AM      Result Value Range   WBC 8.8  4.0 - 10.5 K/uL   RBC 4.66  3.87 - 5.11 MIL/uL   Hemoglobin 14.4  12.0 - 15.0 g/dL   HCT 16.1  09.6 - 04.5 %   MCV 89.1  78.0 - 100.0 fL   MCH 30.9  26.0 - 34.0 pg   MCHC 34.7  30.0 - 36.0 g/dL   RDW 40.9  81.1 - 91.4 %   Platelets 336  150 - 400 K/uL  COMPREHENSIVE METABOLIC PANEL     Status: Abnormal   Collection Time    11/30/12  3:15 AM      Result Value Range   Sodium 136  135 - 145 mEq/L   Potassium 3.6  3.5 - 5.1 mEq/L   Chloride 102  96 - 112 mEq/L   CO2 20  19 - 32 mEq/L   Glucose, Bld 93  70 - 99 mg/dL   BUN 8  6 - 23 mg/dL   Creatinine, Ser 7.82  0.50 - 1.10 mg/dL   Calcium 9.9  8.4 - 95.6 mg/dL   Total Protein 7.5  6.0 - 8.3 g/dL   Albumin 4.4  3.5 - 5.2 g/dL   AST 16  0 - 37 U/L   ALT 10  0 - 35 U/L   Alkaline Phosphatase 47  39 - 117 U/L   Total Bilirubin 0.2 (*) 0.3 - 1.2 mg/dL   GFR calc non Af Amer >90  >90 mL/min   GFR calc Af Amer >90  >90 mL/min   Comment: (NOTE)     The eGFR has been calculated using the CKD EPI equation.     This calculation has not been validated in all clinical situations.     eGFR's persistently <90 mL/min  signify possible Chronic Kidney     Disease.  ETHANOL     Status: Abnormal   Collection Time    11/30/12  3:15 AM      Result Value Range   Alcohol, Ethyl (B) 198 (*) 0 - 11 mg/dL   Comment:            LOWEST DETECTABLE LIMIT FOR     SERUM ALCOHOL IS 11 mg/dL     FOR MEDICAL PURPOSES ONLY  SALICYLATE LEVEL     Status: Abnormal   Collection Time    11/30/12  3:15 AM      Result Value Range   Salicylate Lvl <2.0 (*) 2.8 - 20.0 mg/dL  HCG, QUANTITATIVE, PREGNANCY     Status: Abnormal   Collection Time    11/30/12  3:21 AM      Result Value Range   hCG, Beta Chain, Quant, S 11 (*) <5 mIU/mL   Comment:              GEST. AGE      CONC.  (mIU/mL)       <=1 WEEK        5 - 50         2 WEEKS       50 - 500         3 WEEKS       100 - 10,000         4 WEEKS     1,000 - 30,000         5 WEEKS     3,500 - 115,000       6-8 WEEKS     12,000 - 270,000        12 WEEKS     15,000 - 220,000                FEMALE AND NON-PREGNANT FEMALE:         LESS THAN 5 mIU/mL  POCT PREGNANCY, URINE     Status: None   Collection Time    11/30/12  3:33 AM      Result Value Range   Preg Test, Ur NEGATIVE  NEGATIVE   Comment:            THE SENSITIVITY OF THIS     METHODOLOGY IS >24 mIU/mL   Labs are reviewed and are pertinent for no medical issues .  Current Facility-Administered Medications  Medication Dose Route Frequency Provider Last Rate Last Dose  . alum & mag hydroxide-simeth (MAALOX/MYLANTA) 200-200-20 MG/5ML suspension 30 mL  30 mL Oral PRN Phill Mutter Dammen, PA-C      . citalopram (CELEXA) tablet 20 mg  20 mg Oral Daily Nanine Means, NP      . diphenhydrAMINE (BENADRYL) injection 25 mg  25 mg Intramuscular Once [employer], PA-C      . hydrOXYzine (ATARAX/VISTARIL) tablet 25 mg  25 mg Oral TID PRN Nanine Means, NP      .  ibuprofen (ADVIL,MOTRIN) tablet 600 mg  600 mg Oral Q8H PRN Angus Seller, PA-C      . LORazepam (ATIVAN) tablet 1 mg  1 mg Oral Q8H PRN Phill Mutter Dammen, PA-C      .  ondansetron [facility]) tablet 4 mg  4 mg Oral Q8H PRN Phill Mutter Dammen, PA-C      . traZODone (DESYREL) tablet 100 mg  100 mg Oral QHS Nanine Means, NP      . zolpidem (AMBIEN) tablet 5 mg  5 mg Oral QHS PRN Angus Seller, PA-C       Current Outpatient Prescriptions  Medication Sig Dispense Refill  . acetaminophen (TYLENOL) 500 MG tablet Take 1,500 mg by mouth every 6 (six) hours as needed for pain (For cramping.).      Marland Kitchen citalopram (CELEXA) 20 MG tablet Take 20 mg by mouth daily.      . traZODone (DESYREL) 100 MG tablet Take 1 tablet (100 mg total) by mouth at bedtime. For sleep.  30 tablet  0    Psychiatric Specialty Exam:     Blood pressure 99/60, pulse 67, temperature 98.3 F (36.8 C), temperature source Oral, resp. rate 16, height 5\' 9"  (1.753 m), last menstrual period 10/26/2012, SpO2 97.00%.There is no weight on file to calculate BMI.  General Appearance: Casual  Eye Contact::  Poor  Speech:  Slow  Volume:  Decreased  Mood:  Depressed  Affect:  Congruent  Thought Process:  Coherent  Orientation:  Full (Time, Place, and Person)  Thought Content:  WDL  Suicidal Thoughts:  Yes.  with intent/plan  Homicidal Thoughts:  No  Memory:  Immediate;   Fair Recent;   Fair Remote;   Fair  Judgement:  Poor  Insight:  Fair  Psychomotor Activity:  Decreased  Concentration:  Poor  Recall:  Poor  Akathisia:  No  Handed:  Right  AIMS (if indicated):     Assets:  Resilience  Sleep:      Treatment Plan Summary: Patient warrants inpatient hospitalization for depression with suicidal thoughts and plan to cut herself.  Dr. Ladona Ridgel has assessed the patient and concurs with treatment plan. Daily contact with patient to assess and evaluate symptoms and progress in treatment Medication management  Nanine Means, PMH-NP 11/30/2012 10:22 AM

## 2012-11-30 NOTE — Progress Notes (Addendum)
[name] called requesting information regarding pt recent miscarriage. CSW discussed with NP, who stated that since pt HCG levels were dropping naturally that pt was naturally miscarrying and that she would not warrant a DNC. CSW informed [name] who will discuss with MD. CSW awaiting return call from [place].    Catha Gosselin, [place] [phone]  ED CSW 11/30/2012 1410pm

## 2012-11-30 NOTE — Progress Notes (Addendum)
Pt referred to [facility] pending reivew.  Pt referred to [facility] pending review.  Pt referred to [facility] pending review.  Pt referred to [facility], pending review. CSW left message.  Pt referred to [facility] pending reivew.  Pt referred to [facility] pending review.  Pt referred to [facility] pending review.    Pt declined from [facility].  No beds available at [facility],  .Catha Gosselin, [place] [phone]  ED CSW .11/30/2012 1107am

## 2012-11-30 NOTE — BH Assessment (Signed)
Tele Assessment Note   Nichole Lopez is an 27 y.o. female that was seen via tele assessment after presenting to [facility] via [employer].  History provided by the patient, [employer], and past assessments, as pt is a poor historian. Patient is a 27 year old female that presented with SI with attempt, by asking the police to shoot her.  Patient states that she is very sad and depressed due to her recent miscarriage. She states that she was approximately [redacted] weeks pregnant and was told by another doctor that she had a miscarriage. Pt stated that she was sad and called the crisis help line. [employer] then arrived to her home and the patient states that she was deliberately confrontational because she wanted the police to shoot her. She does admit to some alcohol use last night, reporting she had "more than I usually drink."  Pt stated she drinks 1 12 pk beer per week.  Pt currently denies SI, HI or psychosis.  Pt appears depressed.  Although pt answered some questions, she is a poor historian.  Pt stated she is no longer suicidal, but admits to being depressed due to her recent miscarriage.  Pt stated she lives alone.  Pt stated she attends [facility] for med mgnt and takes Celexa and Trazadone.  Pt stated she takes her meds as prescribed.  Pt stated she no longer goes to [employer] for community support.  Pt admitted to cutting her arm with glass last night and pt has a hx of cutting herself.  Pt stated she did this because she was upset.  Called EDP Fayrene Fearing @ [phone] who recommended pt be seen by psychiatry.  Pt to be seen in ED today for recommendations.  Updated TTS and ED staff once tele assessment completed.   Axis I: 309.81 Posttraumatic Stress Disorder Axis II: Deferred Axis III:  Past Medical History  Diagnosis Date  . Asthma   . Mental disorder PTSD Depression Schizophrenic disorder,  anxiety  . Depression   . Deliberate self-cutting   . PTSD (post-traumatic stress disorder)   . Bipolar 1 disorder    Axis IV: other  psychosocial or environmental problems, problems related to social environment and problems with primary support group Axis V: 21-30 behavior considerably influenced by delusions or hallucinations OR serious impairment in judgment, communication OR inability to function in almost all areas  Past Medical History:  Past Medical History  Diagnosis Date  . Asthma   . Mental disorder PTSD Depression Schizophrenic disorder,  anxiety  . Depression   . Deliberate self-cutting   . PTSD (post-traumatic stress disorder)   . Bipolar 1 disorder     Past Surgical History  Procedure Laterality Date  . Foot surgery      Family History:  Family History  Problem Relation Age of Onset  . Diabetes Other   . Depression Maternal Uncle     Social History:  reports that she has been smoking Cigarettes.  She has been smoking about 0.50 packs per day. She has never used smokeless tobacco. She reports that she drinks about 13.2 ounces of alcohol per week. She reports that she uses illicit drugs (Marijuana).  Additional Social History:  Alcohol / Drug Use Pain Medications: see MAR Prescriptions: see MAR Over the Counter: see MAR History of alcohol / drug use?: Yes Longest period of sobriety (when/how long): unknown Negative Consequences of Use:  (pt denies) Withdrawal Symptoms:  (pt denies) Substance #1 Name of Substance 1: Alcohol 1 - Age of First Use: 14  1 - Amount (size/oz): 12 pk of beer 1 - Frequency: per week 1 - Duration: ongoing 1 - Last Use / Amount: 11/29/12 - unknown - pt stated more than she usually drinks  CIWA: CIWA-Ar BP: 115/76 mmHg Pulse Rate: 89 COWS:    Allergies: No Known Allergies  Home Medications:  (Not in a hospital admission)  OB/GYN Status:  Patient's last menstrual period was 10/26/2012.  General Assessment Data Location of Assessment: [facility] ED Is this a Tele or Face-to-Face Assessment?: Tele Assessment Is this an Initial Assessment or a Re-assessment for this  encounter?: Initial Assessment Living Arrangements: Alone Can pt return to current living arrangement?: Yes Admission Status: Voluntary Is patient capable of signing voluntary admission?: Yes Transfer from: Acute Hospital Referral Source: Other ([employer])  Medical Screening Exam [facility] Walk-in ONLY) Medical Exam completed: No Reason for MSE not completed: Other: (pt med cleared at [facility])  [facility] Crisis Care Plan Living Arrangements: Alone Name of Psychiatrist: Vesta Mixer Name of Therapist: none  Education Status Is patient currently in school?: No  Risk to self Suicidal Ideation: No-Not Currently/Within Last 6 Months Suicidal Intent: No-Not Currently/Within Last 6 Months Is patient at risk for suicide?: Yes Suicidal Plan?: Yes-Currently Present Specify Current Suicidal Plan: pt wanted police to shoot her Access to Means: Yes Specify Access to Suicidal Means: had means to ask police to shoot her What has been your use of drugs/alcohol within the last 12 months?: pt admits to alcohol use Previous Attempts/Gestures: Yes (hx of attempts per past assessments) How many times?:  (10 + per past assessments) Other Self Harm Risks: pt denies Triggers for Past Attempts: Other personal contacts Intentional Self Injurious Behavior: Cutting Comment - Self Injurious Behavior: Reports cut herself last night, hx of cutting Family Suicide History: Yes (per past assessments) Recent stressful life event(s): Loss (Comment);Other (Comment) (picked up by [employer], recent miscarriage) Persecutory voices/beliefs?: No Depression: Yes Depression Symptoms: Despondent Substance abuse history and/or treatment for substance abuse?: Yes Suicide prevention information given to non-admitted patients: Not applicable  Risk to Others Homicidal Ideation: No Thoughts of Harm to Others: No Current Homicidal Intent: No Current Homicidal Plan: No Access to Homicidal Means: No Identified Victim: pt denies History of harm to  others?: No Assessment of Violence: None Noted Violent Behavior Description: na - pt cooperative during assessment Does patient have access to weapons?: No Criminal Charges Pending?: No Does patient have a court date: No  Psychosis Hallucinations: None noted Delusions: None noted  Mental Status Report Appear/Hygiene: Disheveled Eye Contact: Poor Motor Activity: Restlessness Speech: Slow;Slurred Level of Consciousness: Drowsy Mood: Depressed Affect: Depressed Anxiety Level: None Thought Processes: Coherent;Relevant Judgement: Impaired Orientation: Person;Place;Time;Situation;Appropriate for developmental age Obsessive Compulsive Thoughts/Behaviors: None  Cognitive Functioning Concentration: Decreased Memory: Recent Intact;Remote Intact IQ: Average Insight: Poor Impulse Control: Poor Appetite: Good Weight Loss: 0 Weight Gain: 0 Sleep: No Change Total Hours of Sleep: 8 Vegetative Symptoms: None  ADLScreening [facility] Assessment Services) Patient's cognitive ability adequate to safely complete daily activities?: Yes Patient able to express need for assistance with ADLs?: Yes Independently performs ADLs?: Yes (appropriate for developmental age)  Prior Inpatient Therapy Prior Inpatient Therapy: Yes Prior Therapy Dates: Several unknown dates Prior Therapy Facilty/Provider(s): [facility], [facility] Reason for Treatment: Stabilization  Prior Outpatient Therapy Prior Outpatient Therapy: Yes Prior Therapy Dates: Current and 2013 Prior Therapy Facilty/Provider(s): [facility] (current), [facility] - 2013 Reason for Treatment: Community Support;  Med Management;  Therapy  ADL Screening (condition at time of admission) Patient's cognitive ability adequate  to safely complete daily activities?: Yes Is the patient deaf or have difficulty hearing?: No Does the patient have difficulty seeing, even when wearing glasses/contacts?: No Does the patient have difficulty concentrating,  remembering, or making decisions?: No Patient able to express need for assistance with ADLs?: Yes Does the patient have difficulty dressing or bathing?: No Independently performs ADLs?: Yes (appropriate for developmental age) Does the patient have difficulty walking or climbing stairs?: No  Home Assistive Devices/Equipment Home Assistive Devices/Equipment: None    Abuse/Neglect Assessment (Assessment to be complete while patient is alone) Physical Abuse: Denies Verbal Abuse: Denies Sexual Abuse: Denies Exploitation of patient/patient's resources: Denies Self-Neglect: Denies Values / Beliefs Cultural Requests During Hospitalization: None Spiritual Requests During Hospitalization: None Consults Spiritual Care Consult Needed: No Social Work Consult Needed: No Merchant navy officer (For Healthcare) Advance Directive: Patient does not have advance directive;Patient would not like information    Additional Information 1:1 In Past 12 Months?: No CIRT Risk: No Elopement Risk: No Does patient have medical clearance?: Yes     Disposition:  Disposition Initial Assessment Completed for this Encounter: Yes Disposition of Patient: Other dispositions Other disposition(s): Other (Comment) (Pt to be seen by extender at [facility])  Caryl Comes 11/30/2012 9:04 AM

## 2012-11-30 NOTE — BH Assessment (Signed)
[facility] Assessment Progress Note     Patient was accepted by [facility] by Dr. Marigene Ehlers, from [facility]. Patient to be transported by [facility] as she is under IVC. EDP has been notified.  Shon Baton, MSW, LCSW

## 2012-11-30 NOTE — ED Notes (Signed)
Refusing to take lip piercing out-"it's not fair, i didn't do anything wrong"explained procedure/rules-security present for assistance

## 2012-11-30 NOTE — ED Provider Notes (Signed)
6:27 PM  Spoke with [facility] team.  Pt to be accepted to [facility] by Carroll Sage.  Will transfer. I was not involved in this patient's care.  Layla Maw Ward, DO 11/30/12 Silva Bandy

## 2012-11-30 NOTE — Consult Note (Signed)
Note reviewed and agreed with  

## 2012-11-30 NOTE — Progress Notes (Signed)
Received call from Administrative Coordinator, Inetta Fermo, who stated that pt. Has been declined from [facility]. - Rodman Pickle, MHT

## 2012-11-30 NOTE — ED Notes (Addendum)
Pt brought in by [employer]. Pt report SI with visible superficial lacerations to L wrist. En route to facility pt made statements asking police to put a bullet in her head. Pt calm and cooperative. [employer] state possible pregnancy.

## 2012-11-30 NOTE — ED Notes (Signed)
Called the sheriff dept. For transportation.@[phone]  and called report to [facility] [phone]) RN Deon Pilling. Will continue to monitor for safety.

## 2012-12-03 NOTE — MAU Provider Note (Signed)
Attestation of Attending Supervision of Advanced Practitioner (CNM/NP): Evaluation and management procedures were performed by the Advanced Practitioner under my supervision and collaboration. I have reviewed the Advanced Practitioner's note and chart, and I agree with the management and plan.  LEGGETT,KELLY H. 4:46 PM

## 2013-02-07 NOTE — L&D Delivery Note (Signed)
Delivery Note Progressed quickly to complete dilation.  Pushed well to SVD.    At 3:18 AM a viable and healthy female was delivered via Vaginal, Spontaneous Delivery (Presentation: OA).  APGAR: 9, 9; weight  .   Placenta status: Intact, Spontaneous.  Cord: 3 vessels with the following complications: Hyperspiraled, and somewhat short.  Anesthesia: Epidural  Episiotomy: None Lacerations: 1st degree;Vaginal;Labial Suture Repair: 3.0 vicryl rapide Est. Blood Loss (mL): 300  Mom to postpartum.  Baby to Couplet care / Skin to Skin.  [facility] HospitalWILLIAMS,Maclovia Uher 02/03/2014, 3:50 AM

## 2013-06-07 ENCOUNTER — Inpatient Hospital Stay (HOSPITAL_COMMUNITY)
Admission: AD | Admit: 2013-06-07 | Discharge: 2013-06-07 | Disposition: A | Discharge status: Home / Self Care | Payer: Medicare Other | Source: Ambulatory Visit | Attending: Obstetrics and Gynecology | Admitting: Obstetrics and Gynecology

## 2013-06-07 ENCOUNTER — Encounter (HOSPITAL_COMMUNITY): Payer: Self-pay | Admitting: General Practice

## 2013-06-07 DIAGNOSIS — J45909 Unspecified asthma, uncomplicated: Secondary | ICD-10-CM | POA: Insufficient documentation

## 2013-06-07 DIAGNOSIS — F329 Major depressive disorder, single episode, unspecified: Secondary | ICD-10-CM | POA: Insufficient documentation

## 2013-06-07 DIAGNOSIS — F3289 Other specified depressive episodes: Secondary | ICD-10-CM | POA: Insufficient documentation

## 2013-06-07 DIAGNOSIS — Z833 Family history of diabetes mellitus: Secondary | ICD-10-CM | POA: Insufficient documentation

## 2013-06-07 DIAGNOSIS — F17200 Nicotine dependence, unspecified, uncomplicated: Secondary | ICD-10-CM | POA: Insufficient documentation

## 2013-06-07 DIAGNOSIS — Z3201 Encounter for pregnancy test, result positive: Secondary | ICD-10-CM | POA: Insufficient documentation

## 2013-06-07 LAB — URINALYSIS, ROUTINE W REFLEX MICROSCOPIC
Bilirubin Urine: NEGATIVE
Glucose, UA: NEGATIVE mg/dL
Hgb urine dipstick: NEGATIVE
Ketones, ur: 15 mg/dL — AB
LEUKOCYTES UA: NEGATIVE
NITRITE: NEGATIVE
PROTEIN: NEGATIVE mg/dL
Specific Gravity, Urine: >1.03 — ABNORMAL HIGH (ref 1.005–1.030)
Urobilinogen, UA: 1 mg/dL (ref 0.0–1.0)
pH: 6 (ref 5.0–8.0)

## 2013-06-07 LAB — POCT PREGNANCY, URINE: PREG TEST UR: POSITIVE — AB

## 2013-06-07 NOTE — MAU Provider Note (Signed)
Attestation of Attending Supervision of Advanced Practitioner (CNM/NP): Evaluation and management procedures were performed by the Advanced Practitioner under my supervision and collaboration.  I have reviewed the Advanced Practitioner's note and chart, and I agree with the management and plan.  Malonie Tatum 06/07/2013 6:56 PM

## 2013-06-07 NOTE — MAU Note (Signed)
Pt presents to MAU with requests to confirm pregnancy after 3 positive HPT's and to check and see if the baby is OK.

## 2013-06-07 NOTE — Discharge Instructions (Signed)
Pregnancy - First Trimester  During sexual intercourse, millions of sperm go into the vagina. Only 1 sperm will penetrate and fertilize the female egg while it is in the Fallopian tube. One week later, the fertilized egg implants into the wall of the uterus. An embryo begins to develop into a baby. At 6 to 8 weeks, the eyes and face are formed and the heartbeat can be seen on ultrasound. At the end of 12 weeks (first trimester), all the baby's organs are formed. Now that you are pregnant, you will want to do everything you can to have a healthy baby. Two of the most important things are to get good prenatal care and follow your caregiver's instructions. Prenatal care is all the medical care you receive before the baby's birth. It is given to prevent, find, and treat problems during the pregnancy and childbirth.  PRENATAL EXAMS  · During prenatal visits, your weight, blood pressure, and urine are checked. This is done to make sure you are healthy and progressing normally during the pregnancy.  · A pregnant woman should gain 25 to 35 pounds during the pregnancy. However, if you are overweight or underweight, your caregiver will advise you regarding your weight.  · Your caregiver will ask and answer questions for you.  · Blood work, cervical cultures, other necessary tests, and a Pap test are done during your prenatal exams. These tests are done to check on your health and the probable health of your baby. Tests are strongly recommended and done for HIV with your permission. This is the virus that causes AIDS. These tests are done because medicines can be given to help prevent your baby from being born with this infection should you have been infected without knowing it. Blood work is also used to find out your blood type, previous infections, and follow your blood levels (hemoglobin).  · Low hemoglobin (anemia) is common during pregnancy. Iron and vitamins are given to help prevent this. Later in the pregnancy, blood  tests for diabetes will be done along with any other tests if any problems develop.  · You may need other tests to make sure you and the baby are doing well.  CHANGES DURING THE FIRST TRIMESTER   Your body goes through many changes during pregnancy. They vary from person to person. Talk to your caregiver about changes you notice and are concerned about. Changes can include:  · Your menstrual period stops.  · The egg and sperm carry the genes that determine what you look like. Genes from you and your partner are forming a baby. The female genes determine whether the baby is a boy or a girl.  · Your body increases in girth and you may feel bloated.  · Feeling sick to your stomach (nauseous) and throwing up (vomiting). If the vomiting is uncontrollable, call your caregiver.  · Your breasts will begin to enlarge and become tender.  · Your nipples may stick out more and become darker.  · The need to urinate more. Painful urination may mean you have a bladder infection.  · Tiring easily.  · Loss of appetite.  · Cravings for certain kinds of food.  · At first, you may gain or lose a couple of pounds.  · You may have changes in your emotions from day to day (excited to be pregnant or concerned something may go wrong with the pregnancy and baby).  · You may have more vivid and strange dreams.  HOME CARE INSTRUCTIONS   ·   It is very important to avoid all smoking, alcohol and non-prescribed drugs during your pregnancy. These affect the formation and growth of the baby. Avoid chemicals while pregnant to ensure the delivery of a healthy infant.  · Start your prenatal visits by the 12th week of pregnancy. They are usually scheduled monthly at first, then more often in the last 2 months before delivery. Keep your caregiver's appointments. Follow your caregiver's instructions regarding medicine use, blood and lab tests, exercise, and diet.  · During pregnancy, you are providing food for you and your baby. Eat regular, well-balanced  meals. Choose foods such as meat, fish, milk and other low fat dairy products, vegetables, fruits, and whole-grain breads and cereals. Your caregiver will tell you of the ideal weight gain.  · You can help morning sickness by keeping soda crackers at the bedside. Eat a couple before arising in the morning. You may want to use the crackers without salt on them.  · Eating 4 to 5 small meals rather than 3 large meals a day also may help the nausea and vomiting.  · Drinking liquids between meals instead of during meals also seems to help nausea and vomiting.  · A physical sexual relationship may be continued throughout pregnancy if there are no other problems. Problems may be early (premature) leaking of amniotic fluid from the membranes, vaginal bleeding, or belly (abdominal) pain.  · Exercise regularly if there are no restrictions. Check with your caregiver or physical therapist if you are unsure of the safety of some of your exercises. Greater weight gain will occur in the last 2 trimesters of pregnancy. Exercising will help:  · Control your weight.  · Keep you in shape.  · Prepare you for labor and delivery.  · Help you lose your pregnancy weight after you deliver your baby.  · Wear a good support or jogging bra for breast tenderness during pregnancy. This may help if worn during sleep too.  · Ask when prenatal classes are available. Begin classes when they are offered.  · Do not use hot tubs, steam rooms, or saunas.  · Wear your seat belt when driving. This protects you and your baby if you are in an accident.  · Avoid raw meat, uncooked cheese, cat litter boxes, and soil used by cats throughout the pregnancy. These carry germs that can cause birth defects in the baby.  · The first trimester is a good time to visit your dentist for your dental health. Getting your teeth cleaned is okay. Use a softer toothbrush and brush gently during pregnancy.  · Ask for help if you have financial, counseling, or nutritional needs  during pregnancy. Your caregiver will be able to offer counseling for these needs as well as refer you for other special needs.  · Do not take any medicines or herbs unless told by your caregiver.  · Inform your caregiver if there is any mental or physical domestic violence.  · Make a list of emergency phone numbers of family, friends, hospital, and police and fire departments.  · Write down your questions. Take them to your prenatal visit.  · Do not douche.  · Do not cross your legs.  · If you have to stand for long periods of time, rotate you feet or take small steps in a circle.  · You may have more vaginal secretions that may require a sanitary pad. Do not use tampons or scented sanitary pads.  MEDICINES AND DRUG USE IN PREGNANCY  ·   Take prenatal vitamins as directed. The vitamin should contain 1 milligram of folic acid. Keep all vitamins out of reach of children. Only a couple vitamins or tablets containing iron may be fatal to a baby or young child when ingested.  · Avoid use of all medicines, including herbs, over-the-counter medicines, not prescribed or suggested by your caregiver. Only take over-the-counter or prescription medicines for pain, discomfort, or fever as directed by your caregiver. Do not use aspirin, ibuprofen, or naproxen unless directed by your caregiver.  · Let your caregiver also know about herbs you may be using.  · Alcohol is related to a number of birth defects. This includes fetal alcohol syndrome. All alcohol, in any form, should be avoided completely. Smoking will cause low birth rate and premature babies.  · Street or illegal drugs are very harmful to the baby. They are absolutely forbidden. A baby born to an addicted mother will be addicted at birth. The baby will go through the same withdrawal an adult does.  · Let your caregiver know about any medicines that you have to take and for what reason you take them.  SEEK MEDICAL CARE IF:   You have any concerns or worries during your  pregnancy. It is better to call with your questions if you feel they cannot wait, rather than worry about them.  SEEK IMMEDIATE MEDICAL CARE IF:   · An unexplained oral temperature above 102° F (38.9° C) develops, or as your caregiver suggests.  · You have leaking of fluid from the vagina (birth canal). If leaking membranes are suspected, take your temperature and inform your caregiver of this when you call.  · There is vaginal spotting or bleeding. Notify your caregiver of the amount and how many pads are used.  · You develop a bad smelling vaginal discharge with a change in the color.  · You continue to feel sick to your stomach (nauseated) and have no relief from remedies suggested. You vomit blood or coffee ground-like materials.  · You lose more than 2 pounds of weight in 1 week.  · You gain more than 2 pounds of weight in 1 week and you notice swelling of your face, hands, feet, or legs.  · You gain 5 pounds or more in 1 week (even if you do not have swelling of your hands, face, legs, or feet).  · You get exposed to German measles and have never had them.  · You are exposed to fifth disease or chickenpox.  · You develop belly (abdominal) pain. Round ligament discomfort is a common non-cancerous (benign) cause of abdominal pain in pregnancy. Your caregiver still must evaluate this.  · You develop headache, fever, diarrhea, pain with urination, or shortness of breath.  · You fall or are in a car accident or have any kind of trauma.  · There is mental or physical violence in your home.  Document Released: 01/18/2001 Document Revised: 10/19/2011 Document Reviewed: 07/22/2008  [employer]® Patient Information ©2014 [employer].

## 2013-06-07 NOTE — MAU Provider Note (Signed)
  History     CSN: [id number]  Arrival date and time: 06/07/13 1547   First Provider Initiated Contact with Patient 06/07/13 1627      Chief Complaint  Patient presents with  . Possible Pregnancy   Possible Pregnancy    Nichole Lopez is a 28 y.o. G2P0010 at 331w3d who presents today for pregnancy verification. She denies any vaginal bleeding or discharge, pain, nausea or vomiting. She states that she "just wanted to make sure everything was ok", and wanted to know if we were going to check an "hcg level" today.    Past Medical History  Diagnosis Date  . Asthma   . Mental disorder PTSD Depression Schizophrenic disorder,  anxiety  . Depression   . Deliberate self-cutting   . PTSD (post-traumatic stress disorder)   . Bipolar 1 disorder     Past Surgical History  Procedure Laterality Date  . Foot surgery      Family History  Problem Relation Age of Onset  . Diabetes Other   . Depression Maternal Uncle     History  Substance Use Topics  . Smoking status: Current Every Day Smoker -- 0.50 packs/day    Types: Cigarettes  . Smokeless tobacco: Never Used  . Alcohol Use: 13.2 oz/week    12 Cans of beer, 10 Shots of liquor per week     Comment: Heavy    Allergies: No Known Allergies  Prescriptions prior to admission  Medication Sig Dispense Refill  . acetaminophen (TYLENOL) 500 MG tablet Take 1,500 mg by mouth every 6 (six) hours as needed for pain (For cramping.).      Marland Kitchen. citalopram (CELEXA) 20 MG tablet Take 20 mg by mouth daily.      . traZODone (DESYREL) 100 MG tablet Take 1 tablet (100 mg total) by mouth at bedtime. For sleep.  30 tablet  0    ROS Physical Exam   Blood pressure 122/78, pulse 86, temperature 98.1 F (36.7 C), temperature source Oral, resp. rate 18, height 5\' 9"  (1.753 m), weight 104.237 kg (229 lb 12.8 oz), last menstrual period 05/07/2013.  Physical Exam  Nursing note and vitals reviewed. Constitutional: She is oriented to person, place, and  time. She appears well-developed and well-nourished. No distress.  Cardiovascular: Normal rate.   Respiratory: Effort normal.  GI: Soft. There is no tenderness.  Neurological: She is alert and oriented to person, place, and time.  Skin: Skin is warm and dry.  Psychiatric: She has a normal mood and affect.    MAU Course  Procedures  Results for orders placed during the hospital encounter of 06/07/13 (from the past 24 hour(s))  POCT PREGNANCY, URINE     Status: Abnormal   Collection Time    06/07/13  4:09 PM      Result Value Ref Range   Preg Test, Ur POSITIVE (*) NEGATIVE     Assessment and Plan   1. Encounter for pregnancy test, result positive    List of providers given Pregnancy verification letter given [facility] Return to MAU as needed First trimester precautions  Follow-up Information   Schedule an [facility] as [facility] as [facility] [place]   Contact information:   [address] [place] [address] [phone]       Tawnya CrookHeather Donovan Hogan 06/07/2013, 4:29 PM

## 2013-08-10 ENCOUNTER — Inpatient Hospital Stay (HOSPITAL_COMMUNITY)
Admission: AD | Admit: 2013-08-10 | Discharge: 2013-08-11 | Disposition: A | Discharge status: Home / Self Care | Payer: Medicare Other | Source: Ambulatory Visit | Attending: Obstetrics & Gynecology | Admitting: Obstetrics & Gynecology

## 2013-08-10 DIAGNOSIS — B9689 Other specified bacterial agents as the cause of diseases classified elsewhere: Secondary | ICD-10-CM | POA: Insufficient documentation

## 2013-08-10 DIAGNOSIS — O2390 Unspecified genitourinary tract infection in pregnancy, unspecified trimester: Secondary | ICD-10-CM | POA: Insufficient documentation

## 2013-08-10 DIAGNOSIS — R109 Unspecified abdominal pain: Secondary | ICD-10-CM | POA: Insufficient documentation

## 2013-08-10 DIAGNOSIS — A499 Bacterial infection, unspecified: Secondary | ICD-10-CM | POA: Insufficient documentation

## 2013-08-10 DIAGNOSIS — O99330 Smoking (tobacco) complicating pregnancy, unspecified trimester: Secondary | ICD-10-CM | POA: Insufficient documentation

## 2013-08-10 DIAGNOSIS — N760 Acute vaginitis: Secondary | ICD-10-CM | POA: Insufficient documentation

## 2013-08-10 DIAGNOSIS — O26899 Other specified pregnancy related conditions, unspecified trimester: Secondary | ICD-10-CM

## 2013-08-11 ENCOUNTER — Encounter (HOSPITAL_COMMUNITY): Payer: Self-pay | Admitting: *Deleted

## 2013-08-11 DIAGNOSIS — A499 Bacterial infection, unspecified: Secondary | ICD-10-CM | POA: Diagnosis not present

## 2013-08-11 DIAGNOSIS — R109 Unspecified abdominal pain: Secondary | ICD-10-CM | POA: Diagnosis present

## 2013-08-11 DIAGNOSIS — O99330 Smoking (tobacco) complicating pregnancy, unspecified trimester: Secondary | ICD-10-CM | POA: Diagnosis not present

## 2013-08-11 DIAGNOSIS — B9689 Other specified bacterial agents as the cause of diseases classified elsewhere: Secondary | ICD-10-CM

## 2013-08-11 DIAGNOSIS — N760 Acute vaginitis: Secondary | ICD-10-CM | POA: Diagnosis not present

## 2013-08-11 DIAGNOSIS — O2390 Unspecified genitourinary tract infection in pregnancy, unspecified trimester: Secondary | ICD-10-CM | POA: Diagnosis not present

## 2013-08-11 LAB — URINALYSIS, ROUTINE W REFLEX MICROSCOPIC
BILIRUBIN URINE: NEGATIVE
Glucose, UA: NEGATIVE mg/dL
Hgb urine dipstick: NEGATIVE
Ketones, ur: NEGATIVE mg/dL
Leukocytes, UA: NEGATIVE
Nitrite: NEGATIVE
PROTEIN: NEGATIVE mg/dL
Specific Gravity, Urine: >1.03 — ABNORMAL HIGH (ref 1.005–1.030)
UROBILINOGEN UA: 0.2 mg/dL (ref 0.0–1.0)
pH: 5.5 (ref 5.0–8.0)

## 2013-08-11 LAB — WET PREP, GENITAL
TRICH WET PREP: NONE SEEN
Yeast Wet Prep HPF POC: NONE SEEN

## 2013-08-11 LAB — OB RESULTS CONSOLE GC/CHLAMYDIA
Chlamydia: NEGATIVE
Gonorrhea: NEGATIVE

## 2013-08-11 MED ORDER — METRONIDAZOLE 500 MG PO TABS: 500.0000 mg | ORAL_TABLET | Freq: Two times a day (BID) | ORAL | Status: DC

## 2013-08-11 NOTE — MAU Provider Note (Signed)
History     CSN: [id number]  Arrival date and time: 08/10/13 2349   First Provider Initiated Contact with Patient 08/11/13 [phone]      Chief Complaint  Patient presents with  . Abdominal Pain   HPI 28 y.o. G2P0010 at 6520w5d with generalized intermittent sharp abd pain, moves around entire abdomen, no vaginal bleeding or discharge. No n/v.   Past Medical History  Diagnosis Date  . Asthma   . Mental disorder PTSD Depression Schizophrenic disorder,  anxiety  . Depression   . Deliberate self-cutting   . PTSD (post-traumatic stress disorder)   . Bipolar 1 disorder     Past Surgical History  Procedure Laterality Date  . Foot surgery      Family History  Problem Relation Age of Onset  . Diabetes Other   . Depression Maternal Uncle     History  Substance Use Topics  . Smoking status: Current Every Day Smoker -- 0.50 packs/day    Types: Cigarettes  . Smokeless tobacco: Never Used  . Alcohol Use: 13.2 oz/week    12 Cans of beer, 10 Shots of liquor per week     Comment: Heavy    7/5 states is not drinking alcohol    Allergies: No Known Allergies  Prescriptions prior to admission  Medication Sig Dispense Refill  . acetaminophen (TYLENOL) 500 MG tablet Take 500 mg by mouth every 6 (six) hours as needed for pain (For cramping.).       Marland Kitchen. citalopram (CELEXA) 20 MG tablet Take 20 mg by mouth daily.      . traZODone (DESYREL) 100 MG tablet Take 1 tablet (100 mg total) by mouth at bedtime. For sleep.  30 tablet  0    Review of Systems  Constitutional: Negative.  Negative for fever.  Respiratory: Negative.   Cardiovascular: Negative.   Gastrointestinal: Positive for abdominal pain. Negative for nausea, vomiting, diarrhea and constipation.  Genitourinary: Negative for dysuria, urgency, frequency, hematuria and flank pain.       Negative for vaginal bleeding, vaginal discharge, dyspareunia  Musculoskeletal: Negative.   Neurological: Negative.   Psychiatric/Behavioral:  Negative.    Physical Exam   Blood pressure 122/74, pulse 88, temperature 98.7 F (37.1 C), temperature source Oral, resp. rate 20, height 5\' 9"  (1.753 m), weight 230 lb 12.8 oz (104.69 kg), last menstrual period 05/07/2013.  Physical Exam  Nursing note and vitals reviewed. Constitutional: She is oriented to person, place, and time. She appears well-developed and well-nourished.  Cardiovascular: Normal rate.   Respiratory: Effort normal.  GI: Soft. She exhibits no distension and no mass. There is no tenderness. There is no rebound and no guarding.  Genitourinary: Uterus is enlarged (c/w dates). Uterus is not tender. Right adnexum displays tenderness. Right adnexum displays no mass and no fullness. Left adnexum displays tenderness. Left adnexum displays no mass and no fullness. No bleeding around the vagina. Vaginal discharge (white) found.  Tender in area of round ligament bilaterally  Musculoskeletal: Normal range of motion.  Neurological: She is alert and oriented to person, place, and time.  Skin: Skin is warm and dry.  Psychiatric: She has a normal mood and affect.    MAU Course  Procedures Results for orders placed during the hospital encounter of 08/10/13 (from the past 24 hour(s))  URINALYSIS, ROUTINE W REFLEX MICROSCOPIC     Status: Abnormal   Collection Time    08/11/13 12:15 AM      Result Value Ref Range   Color, Urine  YELLOW  YELLOW   APPearance CLEAR  CLEAR   Specific Gravity, Urine >1.030 (*) 1.005 - 1.030   pH 5.5  5.0 - 8.0   Glucose, UA NEGATIVE  NEGATIVE mg/dL   Hgb urine dipstick NEGATIVE  NEGATIVE   Bilirubin Urine NEGATIVE  NEGATIVE   Ketones, ur NEGATIVE  NEGATIVE mg/dL   Protein, ur NEGATIVE  NEGATIVE mg/dL   Urobilinogen, UA 0.2  0.0 - 1.0 mg/dL   Nitrite NEGATIVE  NEGATIVE   Leukocytes, UA NEGATIVE  NEGATIVE  WET PREP, GENITAL     Status: Abnormal   Collection Time    08/11/13 12:55 AM      Result Value Ref Range   Yeast Wet Prep HPF POC NONE SEEN   NONE SEEN   Trich, Wet Prep NONE SEEN  NONE SEEN   Clue Cells Wet Prep HPF POC FEW (*) NONE SEEN   WBC, Wet Prep HPF POC RARE (*) NONE SEEN     Assessment and Plan   1. BV (bacterial vaginosis)   2. Abdominal pain in pregnancy, antepartum       Medication List    STOP taking these medications       acetaminophen 500 MG tablet  Commonly known as:  TYLENOL      TAKE these medications       citalopram 20 MG tablet  Commonly known as:  CELEXA  Take 20 mg by mouth daily.     metroNIDAZOLE 500 MG tablet  Commonly known as:  FLAGYL  Take 1 tablet (500 mg total) by mouth 2 (two) times daily.     traZODone 100 MG tablet  Commonly known as:  DESYREL  Take 1 tablet (100 mg total) by mouth at bedtime. For sleep.            Follow-up Information   [facility] [place] (as scheduled)    Contact information:   [address] [place] [address] [phone]        FRAZIER,NATALIE 08/11/2013, 1:57 AM

## 2013-08-11 NOTE — MAU Note (Signed)
Past 3 days having pain in lower abdomen. Denies bleeding or leaking fld

## 2013-08-11 NOTE — Progress Notes (Signed)
Written and verbal d/c instructions given and understanding voiced. 

## 2013-08-12 LAB — GC/CHLAMYDIA PROBE AMP
CT PROBE, AMP APTIMA: NEGATIVE
GC PROBE AMP APTIMA: NEGATIVE

## 2013-08-21 LAB — CYSTIC FIBROSIS DIAGNOSTIC STUDY: INTERPRETATION-CFDNA: NEGATIVE

## 2013-08-22 ENCOUNTER — Other Ambulatory Visit (HOSPITAL_COMMUNITY): Payer: Self-pay | Admitting: Nurse Practitioner

## 2013-08-22 DIAGNOSIS — Z331 Pregnant state, incidental: Secondary | ICD-10-CM

## 2013-08-22 LAB — OB RESULTS CONSOLE RUBELLA ANTIBODY, IGM: Rubella: IMMUNE

## 2013-08-22 LAB — OB RESULTS CONSOLE RPR: RPR: NONREACTIVE

## 2013-08-22 LAB — OB RESULTS CONSOLE ANTIBODY SCREEN: Antibody Screen: NEGATIVE

## 2013-08-22 LAB — OB RESULTS CONSOLE HIV ANTIBODY (ROUTINE TESTING): HIV: NONREACTIVE

## 2013-08-22 LAB — OB RESULTS CONSOLE ABO/RH

## 2013-08-22 LAB — OB RESULTS CONSOLE HEPATITIS B SURFACE ANTIGEN: HEP B S AG: NEGATIVE

## 2013-08-29 ENCOUNTER — Ambulatory Visit (HOSPITAL_COMMUNITY)
Admission: RE | Admit: 2013-08-29 | Discharge: 2013-08-29 | Disposition: A | Discharge status: Home / Self Care | Payer: Medicare Other | Source: Ambulatory Visit | Attending: Nurse Practitioner | Admitting: Nurse Practitioner

## 2013-08-29 ENCOUNTER — Other Ambulatory Visit (HOSPITAL_COMMUNITY): Payer: Self-pay | Admitting: Nurse Practitioner

## 2013-08-29 DIAGNOSIS — Z3689 Encounter for other specified antenatal screening: Secondary | ICD-10-CM | POA: Diagnosis not present

## 2013-08-29 DIAGNOSIS — Z331 Pregnant state, incidental: Secondary | ICD-10-CM

## 2013-09-03 ENCOUNTER — Other Ambulatory Visit (HOSPITAL_COMMUNITY): Payer: Self-pay | Admitting: Nurse Practitioner

## 2013-09-03 DIAGNOSIS — Z1389 Encounter for screening for other disorder: Secondary | ICD-10-CM

## 2013-09-26 ENCOUNTER — Ambulatory Visit (HOSPITAL_COMMUNITY)
Admission: RE | Admit: 2013-09-26 | Discharge: 2013-09-26 | Disposition: A | Discharge status: Home / Self Care | Payer: Medicare Other | Source: Ambulatory Visit | Attending: Nurse Practitioner | Admitting: Nurse Practitioner

## 2013-09-26 DIAGNOSIS — O355XX0 Maternal care for (suspected) damage to fetus by drugs, not applicable or unspecified: Secondary | ICD-10-CM | POA: Diagnosis not present

## 2013-09-26 DIAGNOSIS — Z363 Encounter for antenatal screening for malformations: Secondary | ICD-10-CM | POA: Insufficient documentation

## 2013-09-26 DIAGNOSIS — Z1389 Encounter for screening for other disorder: Secondary | ICD-10-CM | POA: Diagnosis not present

## 2013-09-26 DIAGNOSIS — O358XX0 Maternal care for other (suspected) fetal abnormality and damage, not applicable or unspecified: Secondary | ICD-10-CM | POA: Insufficient documentation

## 2013-12-09 ENCOUNTER — Encounter (HOSPITAL_COMMUNITY): Payer: Self-pay | Admitting: *Deleted

## 2014-01-27 ENCOUNTER — Inpatient Hospital Stay (HOSPITAL_COMMUNITY)
Admission: AD | Admit: 2014-01-27 | Discharge: 2014-01-27 | Disposition: A | Discharge status: Home / Self Care | Payer: Medicare Other | Source: Ambulatory Visit | Attending: Obstetrics & Gynecology | Admitting: Obstetrics & Gynecology

## 2014-01-27 ENCOUNTER — Encounter (HOSPITAL_COMMUNITY): Payer: Self-pay | Admitting: *Deleted

## 2014-01-27 DIAGNOSIS — Z3A38 38 weeks gestation of pregnancy: Secondary | ICD-10-CM | POA: Insufficient documentation

## 2014-01-27 DIAGNOSIS — Z720 Tobacco use: Secondary | ICD-10-CM | POA: Diagnosis not present

## 2014-01-27 DIAGNOSIS — N898 Other specified noninflammatory disorders of vagina: Secondary | ICD-10-CM | POA: Diagnosis present

## 2014-01-27 DIAGNOSIS — O9989 Other specified diseases and conditions complicating pregnancy, childbirth and the puerperium: Secondary | ICD-10-CM

## 2014-01-27 DIAGNOSIS — O26893 Other specified pregnancy related conditions, third trimester: Secondary | ICD-10-CM | POA: Diagnosis not present

## 2014-01-27 LAB — POCT FERN TEST: POCT FERN TEST: NEGATIVE

## 2014-01-27 NOTE — MAU Note (Signed)
Pt presents to MAU with complaints of leakage of fluid on and off since Friday. Denies any vaginal bleeding or contractions.

## 2014-01-27 NOTE — Discharge Instructions (Signed)
[facility]. We did several tests and your water is not broken. Please follow up with your OB provider as scheduled tomorrow. If you develop regular contractions, vaginal bleeding, loss of fluid, or decreased fetal movement, please return sooner.

## 2014-01-27 NOTE — MAU Provider Note (Signed)
History     CSN: [id number]  Arrival date and time: 01/27/14 1306   None     Chief Complaint  Patient presents with  . Rupture of Membranes   HPI  Nichole Lopez is a 28 y.o. G2P0010 at 6335w6d who presents to MAU for evaluation of leakage of fluid. Has been intermittent leaking a small amount of fluid from the vagina since Friday. Unsure if it is discharge or fluid. Denies gush. +Fetal movement. Denies contractions, vaginal bleeding.   OB History    Gravida Para Term Preterm AB TAB SAB Ectopic Multiple Living   2    1  1          Past Medical History  Diagnosis Date  . Asthma   . Mental disorder PTSD Depression Schizophrenic disorder,  anxiety  . Depression   . Deliberate self-cutting   . PTSD (post-traumatic stress disorder)   . Bipolar 1 disorder     Past Surgical History  Procedure Laterality Date  . Foot surgery      Family History  Problem Relation Age of Onset  . Diabetes Other   . Depression Maternal Uncle     History  Substance Use Topics  . Smoking status: Current Every Day Smoker -- 0.50 packs/day    Types: Cigarettes  . Smokeless tobacco: Never Used  . Alcohol Use: 13.2 oz/week    12 Cans of beer, 10 Shots of liquor per week     Comment: Heavy    7/5 states is not drinking alcohol    Allergies: No Known Allergies  Prescriptions prior to admission  Medication Sig Dispense Refill Last Dose  . calcium carbonate (TUMS - DOSED IN MG ELEMENTAL CALCIUM) 500 MG chewable tablet Chew 1 tablet by mouth 2 (two) times daily as needed for indigestion or heartburn.   Past Week at Unknown time  . citalopram (CELEXA) 40 MG tablet Take 40 mg by mouth daily.   01/27/2014 at Unknown time  . lurasidone (LATUDA) 40 MG TABS tablet Take 40 mg by mouth daily with breakfast.   01/26/2014 at Unknown time  . traZODone (DESYREL) 100 MG tablet Take 1 tablet (100 mg total) by mouth at bedtime. For sleep. (Patient taking differently: Take 200 mg by mouth at bedtime. For  sleep.) 30 tablet 0 01/26/2014 at Unknown time  . citalopram (CELEXA) 20 MG tablet Take 20 mg by mouth daily.   11/29/2012 at Unknown time  . metroNIDAZOLE (FLAGYL) 500 MG tablet Take 1 tablet (500 mg total) by mouth 2 (two) times daily. (Patient not taking: Reported on 01/27/2014) 14 tablet 0 Completed Course at Unknown time    Review of Systems  Constitutional: Negative.  Negative for fever, chills and malaise/fatigue.  HENT: Negative.  Negative for congestion and sore throat.   Eyes: Negative.  Negative for double vision and photophobia.  Respiratory: Negative.  Negative for cough, shortness of breath and wheezing.   Cardiovascular: Negative.  Negative for chest pain and leg swelling.  Gastrointestinal: Negative.  Negative for nausea, vomiting, abdominal pain, diarrhea and constipation.  Genitourinary: Negative.  Negative for dysuria, urgency, frequency and hematuria.  Musculoskeletal: Negative.  Negative for myalgias.  Skin: Negative.   Neurological: Negative.  Negative for weakness and headaches.  Psychiatric/Behavioral: Negative.   All other systems reviewed and are negative.  Physical Exam   Blood pressure 114/68, pulse 107, temperature 98.4 F (36.9 C), resp. rate 18, last menstrual period 05/07/2013.  Physical Exam  Nursing note and vitals reviewed. Constitutional: She  is oriented to person, place, and time. She appears well-developed and well-nourished. No distress.  HENT:  Head: Normocephalic and atraumatic.  Cardiovascular: Normal rate.   Respiratory: Effort normal.  Neurological: She is alert and oriented to person, place, and time.  Skin: Skin is warm and dry.  Psychiatric: She has a normal mood and affect. Her behavior is normal.    MAU Course  Procedures  MDM SSE: neg pooling, neg ferning SVE: FT/Th/Hi NST reactive: 135/mod/+A/-D  Assessment and Plan  Nichole Lopez is a 28 y.o. G2P0010 at 1340w6d who presents with leakage of fluid consistent with  physiologic discharge.   # Rule out ROM Patient with physiologic discharge.  No evidence of ROM on examination.  Reactive NST.  Discharge to home with strict labor/FM precautions.  Follow up with OB provider as scheduled.   William DaltonMcEachern, Caryl Manas 01/27/2014, 2:01 PM

## 2014-02-02 ENCOUNTER — Encounter (HOSPITAL_COMMUNITY): Payer: Self-pay | Admitting: *Deleted

## 2014-02-02 ENCOUNTER — Inpatient Hospital Stay (HOSPITAL_COMMUNITY)
Admission: AD | Admit: 2014-02-02 | Discharge: 2014-02-05 | DRG: 775 | Disposition: A | Discharge status: Home / Self Care | Payer: Medicare Other | Source: Ambulatory Visit | Attending: Obstetrics and Gynecology | Admitting: Obstetrics and Gynecology

## 2014-02-02 DIAGNOSIS — O99334 Smoking (tobacco) complicating childbirth: Secondary | ICD-10-CM | POA: Diagnosis present

## 2014-02-02 DIAGNOSIS — F17210 Nicotine dependence, cigarettes, uncomplicated: Secondary | ICD-10-CM | POA: Diagnosis present

## 2014-02-02 DIAGNOSIS — F319 Bipolar disorder, unspecified: Secondary | ICD-10-CM | POA: Diagnosis not present

## 2014-02-02 DIAGNOSIS — O99344 Other mental disorders complicating childbirth: Secondary | ICD-10-CM | POA: Diagnosis not present

## 2014-02-02 DIAGNOSIS — F4310 Post-traumatic stress disorder, unspecified: Secondary | ICD-10-CM | POA: Diagnosis not present

## 2014-02-02 DIAGNOSIS — Z3A38 38 weeks gestation of pregnancy: Secondary | ICD-10-CM | POA: Diagnosis not present

## 2014-02-02 DIAGNOSIS — O4292 Full-term premature rupture of membranes, unspecified as to length of time between rupture and onset of labor: Principal | ICD-10-CM | POA: Diagnosis present

## 2014-02-02 DIAGNOSIS — F329 Major depressive disorder, single episode, unspecified: Secondary | ICD-10-CM | POA: Diagnosis not present

## 2014-02-02 DIAGNOSIS — J45909 Unspecified asthma, uncomplicated: Secondary | ICD-10-CM | POA: Diagnosis present

## 2014-02-02 DIAGNOSIS — O9952 Diseases of the respiratory system complicating childbirth: Secondary | ICD-10-CM | POA: Diagnosis not present

## 2014-02-02 DIAGNOSIS — F603 Borderline personality disorder: Secondary | ICD-10-CM | POA: Diagnosis not present

## 2014-02-02 DIAGNOSIS — Z3483 Encounter for supervision of other normal pregnancy, third trimester: Secondary | ICD-10-CM | POA: Diagnosis present

## 2014-02-02 DIAGNOSIS — O4290 Premature rupture of membranes, unspecified as to length of time between rupture and onset of labor, unspecified weeks of gestation: Secondary | ICD-10-CM | POA: Diagnosis present

## 2014-02-02 LAB — TYPE AND SCREEN
ABO/RH(D): A POS
Antibody Screen: NEGATIVE

## 2014-02-02 LAB — OB RESULTS CONSOLE GBS: STREP GROUP B AG: NEGATIVE

## 2014-02-02 LAB — CBC
HCT: 36.6 % (ref 36.0–46.0)
Hemoglobin: 13 g/dL (ref 12.0–15.0)
MCH: 31.6 pg (ref 26.0–34.0)
MCHC: 35.5 g/dL (ref 30.0–36.0)
MCV: 89.1 fL (ref 78.0–100.0)
PLATELETS: 172 10*3/uL (ref 150–400)
RBC: 4.11 MIL/uL (ref 3.87–5.11)
RDW: 13 % (ref 11.5–15.5)
WBC: 10.1 10*3/uL (ref 4.0–10.5)

## 2014-02-02 LAB — POCT FERN TEST

## 2014-02-02 LAB — RPR

## 2014-02-02 LAB — HIV ANTIBODY (ROUTINE TESTING W REFLEX): HIV 1&2 Ab, 4th Generation: NONREACTIVE

## 2014-02-02 MED ORDER — OXYCODONE-ACETAMINOPHEN 5-325 MG PO TABS: 1.0000 | ORAL_TABLET | ORAL | Status: DC | PRN

## 2014-02-02 MED ORDER — OXYTOCIN BOLUS FROM INFUSION
500.0000 mL | INTRAVENOUS | Status: DC
  Administered 2014-02-03: 500 mL via INTRAVENOUS

## 2014-02-02 MED ORDER — OXYCODONE-ACETAMINOPHEN 5-325 MG PO TABS: 2.0000 | ORAL_TABLET | ORAL | Status: DC | PRN

## 2014-02-02 MED ORDER — LACTATED RINGERS IV SOLN
500.0000 mL | INTRAVENOUS | Status: DC | PRN
  Administered 2014-02-02: 500 mL via INTRAVENOUS

## 2014-02-02 MED ORDER — MISOPROSTOL 50MCG HALF TABLET
50.0000 ug | ORAL_TABLET | ORAL | Status: DC | PRN
  Administered 2014-02-02 (×2): 50 ug via ORAL
  Filled 2014-02-02 (×2): qty 1

## 2014-02-02 MED ORDER — LACTATED RINGERS IV SOLN: 500.0000 mL | Freq: Once | INTRAVENOUS | Status: DC

## 2014-02-02 MED ORDER — ONDANSETRON HCL 4 MG/2ML IJ SOLN
4.0000 mg | Freq: Four times a day (QID) | INTRAMUSCULAR | Status: DC | PRN
Start: 2014-02-02 — End: 2014-02-03

## 2014-02-02 MED ORDER — DIPHENHYDRAMINE HCL 50 MG/ML IJ SOLN: 12.5000 mg | INTRAMUSCULAR | Status: DC | PRN

## 2014-02-02 MED ORDER — LACTATED RINGERS IV SOLN
INTRAVENOUS | Status: DC
  Administered 2014-02-02 (×2): via INTRAVENOUS

## 2014-02-02 MED ORDER — ZOLPIDEM TARTRATE 5 MG PO TABS
5.0000 mg | ORAL_TABLET | Freq: Every evening | ORAL | Status: DC | PRN
  Administered 2014-02-02: 5 mg via ORAL
  Filled 2014-02-02: qty 1

## 2014-02-02 MED ORDER — ASPIRIN EC 81 MG PO TBEC
81.0000 mg | DELAYED_RELEASE_TABLET | Freq: Every day | ORAL | Status: DC
  Filled 2014-02-02 (×2): qty 1

## 2014-02-02 MED ORDER — TERBUTALINE SULFATE 1 MG/ML IJ SOLN: 0.2500 mg | Freq: Once | INTRAMUSCULAR | Status: AC | PRN

## 2014-02-02 MED ORDER — EPHEDRINE 5 MG/ML INJ
10.0000 mg | INTRAVENOUS | Status: DC | PRN
  Filled 2014-02-02: qty 2

## 2014-02-02 MED ORDER — PHENYLEPHRINE 40 MCG/ML (10ML) SYRINGE FOR IV PUSH (FOR BLOOD PRESSURE SUPPORT)
80.0000 ug | PREFILLED_SYRINGE | INTRAVENOUS | Status: DC | PRN
  Filled 2014-02-02: qty 2
  Filled 2014-02-02: qty 10

## 2014-02-02 MED ORDER — MISOPROSTOL 200 MCG PO TABS
50.0000 ug | ORAL_TABLET | ORAL | Status: DC | PRN
  Filled 2014-02-02 (×2): qty 1

## 2014-02-02 MED ORDER — FENTANYL 2.5 MCG/ML BUPIVACAINE 1/10 % EPIDURAL INFUSION (WH - ANES)
14.0000 mL/h | INTRAMUSCULAR | Status: DC | PRN
  Administered 2014-02-03: 14 mL/h via EPIDURAL
  Filled 2014-02-02: qty 125

## 2014-02-02 MED ORDER — OXYTOCIN 40 UNITS IN LACTATED RINGERS INFUSION - SIMPLE MED
62.5000 mL/h | INTRAVENOUS | Status: DC
  Filled 2014-02-02: qty 1000

## 2014-02-02 MED ORDER — ACETAMINOPHEN 325 MG PO TABS: 650.0000 mg | ORAL_TABLET | ORAL | Status: DC | PRN

## 2014-02-02 MED ORDER — FENTANYL CITRATE 0.05 MG/ML IJ SOLN
100.0000 ug | INTRAMUSCULAR | Status: DC | PRN
  Administered 2014-02-02 (×3): 100 ug via INTRAVENOUS
  Filled 2014-02-02 (×3): qty 2

## 2014-02-02 MED ORDER — PHENYLEPHRINE 40 MCG/ML (10ML) SYRINGE FOR IV PUSH (FOR BLOOD PRESSURE SUPPORT)
80.0000 ug | PREFILLED_SYRINGE | INTRAVENOUS | Status: DC | PRN
  Filled 2014-02-02: qty 2

## 2014-02-02 MED ORDER — CITRIC ACID-SODIUM CITRATE 334-500 MG/5ML PO SOLN: 30.0000 mL | ORAL | Status: DC | PRN

## 2014-02-02 MED ORDER — EPHEDRINE 5 MG/ML INJ
10.0000 mg | INTRAVENOUS | Status: DC | PRN
  Filled 2014-02-02: qty 4
  Filled 2014-02-02: qty 2

## 2014-02-02 MED ORDER — LIDOCAINE HCL (PF) 1 % IJ SOLN
30.0000 mL | INTRAMUSCULAR | Status: DC | PRN
  Filled 2014-02-02: qty 30

## 2014-02-02 NOTE — Progress Notes (Signed)
Patient ID: Nichole Lopez, female   DOB: July 29, 1985, 28 y.o.   MRN: [id number] Sleepy from Ambien Denies pain  Filed Vitals:   02/02/14 1818 02/02/14 1819 02/02/14 2000 02/02/14 2115  BP:  119/67  131/87  Pulse:  84  79  Temp: 98.5 F (36.9 C)  98.4 F (36.9 C)   TempSrc: Oral  Oral   Resp: 18     Height:      Weight:       FHR stable UCs irregular, mild Dilation: 1.5 Effacement (%): 40 Cervical Position: Posterior Presentation: Vertex Exam by:: Dennis BastVeronica Mensah  Continue plan of care

## 2014-02-02 NOTE — MAU Note (Signed)
Pt presents to MAU with complaints of leakage of fluid since 4 this morning. Reports some vaginal spotting.

## 2014-02-02 NOTE — MAU Note (Signed)
LOF since 0400. Denies pain.

## 2014-02-02 NOTE — Progress Notes (Signed)
Nichole Lopez is a 28 y.o. G2P0010 at 6386w5d  admitted for rupture of membranes since 4 a.m  Subjective:  Pt has developed a lot of pressure sx with foley bulb still in place in cervix no bleeding , fhr cat I. Foley has been expelled into vag vault and pressure of vertex on bulb is uncomfortable to pt. Objective: BP 131/87 mmHg  Pulse 79  Temp(Src) 98.4 F (36.9 C) (Oral)  Resp 18  Ht 5\' 9"  (1.753 m)  Wt 116.574 kg (257 lb)  BMI 37.93 kg/m2  LMP 05/07/2013      FHT:  FHR: 140 bpm, variability: minimal ,  accelerations:  Present,  decelerations:  Absent UC:   irregular, every 3-6 minutes SVE:   Cx  5cm. 80%. -1 vertex with some moulding Labs: Lab Results  Component Value Date   WBC 10.1 02/02/2014   HGB 13.0 02/02/2014   HCT 36.6 02/02/2014   MCV 89.1 02/02/2014   PLT 172 02/02/2014    Assessment / Plan: Induction of labor due to PROM,  progressing well on pitocin  Labor: Progressing normally Preeclampsia:   Fetal Wellbeing:  Category I Pain Control:  Epidural I/D:  n/a Anticipated MOD:  NSVD  Jaheem Hedgepath V 02/02/2014, 11:57 PM

## 2014-02-02 NOTE — Progress Notes (Signed)
LABOR PROGRESS NOTE  Nichole Lopez is a 28 y.o. G2P0010 at 2530w5d  admitted for rupture of membranes since 0400  Subjective: Comfortable, not feeling any contractions  Objective: BP 119/67 mmHg  Pulse 84  Temp(Src) 98.5 F (36.9 C) (Oral)  Resp 18  Ht 5\' 9"  (1.753 m)  Wt 257 lb (116.574 kg)  BMI 37.93 kg/m2  LMP 05/07/2013 or  Filed Vitals:   02/02/14 1500 02/02/14 1620 02/02/14 1818 02/02/14 1819  BP:    119/67  Pulse:    84  Temp:  98 F (36.7 C) 98.5 F (36.9 C)   TempSrc:   Oral   Resp: 18  18   Height:      Weight:           FHT:  FHR: 135 bpm, variability: moderate,  accelerations:  Present,  decelerations:  Absent UC:   irregular, every 2-4 minutes, uterine irritability SVE:   Dilation: 1 Effacement (%): Thick Exam by:: dr. Loreta Aveacosta  Dilation: 1 Effacement (%): Thick Cervical Position: Posterior Presentation: Vertex Exam by:: dr. Loreta Aveacosta  Labs: Lab Results  Component Value Date   WBC 10.1 02/02/2014   HGB 13.0 02/02/2014   HCT 36.6 02/02/2014   MCV 89.1 02/02/2014   PLT 172 02/02/2014    Assessment / Plan: IOL 2/2 PROM  Labor: Progressing normally, FB placed at 1840, last dose of cytotec at 1350.  After FB placed started feeling some cramping, will give next dose of cytotec at 1930 Fetal Wellbeing:  Category I Pain Control:  Labor support without medications Anticipated MOD:  NSVD  Nichole Lopez ROCIO, MD 02/02/2014, 6:44 PM

## 2014-02-02 NOTE — H&P (Signed)
LABOR ADMISSION HISTORY AND PHYSICAL  Nichole Lopez is a 28 y.o. female G2P0010 with IUP at 38w5 presenting with rupture of membranes at 0400.   Estimated Date of Delivery: 02/11/14   Prenatal History/Complications:  Past Medical History: Past Medical History  Diagnosis Date  . Asthma   . Mental disorder PTSD Depression Schizophrenic disorder,  anxiety  . Depression   . Deliberate self-cutting   . PTSD (post-traumatic stress disorder)   . Bipolar 1 disorder     Past Surgical History: Past Surgical History  Procedure Laterality Date  . Foot surgery      Obstetrical History: OB History    Gravida Para Term Preterm AB TAB SAB Ectopic Multiple Living   2    1  1         Social History: History   Social History  . Marital Status: Single    Spouse Name: N/A    Number of Children: N/A  . Years of Education: N/A   Social History Main Topics  . Smoking status: Current Every Day Smoker -- 0.50 packs/day    Types: Cigarettes  . Smokeless tobacco: Never Used  . Alcohol Use: 13.2 oz/week    12 Cans of beer, 10 Shots of liquor per week     Comment: Heavy    7/5 states is not drinking alcohol  . Drug Use: 0.50 per week    Special: Marijuana     Comment: last smoked on Sat 08/10/13  . Sexual Activity: Yes    Birth Control/ Protection: None   Other Topics Concern  . None   Social History Narrative    Family History: Family History  Problem Relation Age of Onset  . Diabetes Other   . Depression Maternal Uncle     Allergies: No Known Allergies  Prescriptions prior to admission  Medication Sig Dispense Refill Last Dose  . calcium carbonate (TUMS - DOSED IN MG ELEMENTAL CALCIUM) 500 MG chewable tablet Chew 2 tablets by mouth 2 (two) times daily as needed for indigestion or heartburn.    02/01/2014 at Unknown time  . citalopram (CELEXA) 20 MG tablet Take 20 mg by mouth 2 (two) times daily.    02/02/2014 at Unknown time  . lurasidone (LATUDA) 40 MG TABS tablet Take 40  mg by mouth daily with breakfast.   02/01/2014 at Unknown time  . traZODone (DESYREL) 100 MG tablet Take 1 tablet (100 mg total) by mouth at bedtime. For sleep. (Patient taking differently: Take 200 mg by mouth at bedtime. For sleep.) 30 tablet 0 02/01/2014 at Unknown time     Review of Systems   All systems reviewed and negative except as stated in HPI  Blood pressure 124/80, pulse 91, temperature 98.3 F (36.8 C), resp. rate 18, last menstrual period 05/07/2013. General appearance: alert and cooperative Lungs: clear to auscultation bilaterally Heart: regular rate and rhythm Abdomen: soft, non-tender; bowel sounds normal Pelvic: adequate Extremities: Homans sign is negative, no sign of DVT Presentation: cephalic Fetal monitoringBaseline: 145 bpm, Variability: Good {> 6 bpm), Accelerations: Reactive and Decelerations: Absent Uterine activityrare  Dilation: 1 Effacement (%): Thick Exam by:: SBeck, RN Cephalic presentation confirmed with ultrasound  Prenatal labs: ABO, Rh:   Antibody:   Rubella:   RPR:    HBsAg:    HIV:    GBS:    1 hr Glucola 128, 3rd trimester 139 => 102/173/129/109 Genetic screening  Neg quad Anatomy US normal     Results for orders placed or performed during  the hospital encounter of 02/02/14 (from the past 24 hour(s))  [facility]   Collection Time: 02/02/14 11:38 AM  Result Value Ref Range   POCT Fern Test      Patient Active Problem List   Diagnosis Date Noted  . Premature rupture of membranes 02/02/2014  . Major depressive disorder, recurrent episode, severe, without mention of psychotic behavior 11/30/2012  . Bipolar disorder, current episode manic w/o psychotic features, severe 06/28/2011    Class: Acute  . Alcohol dependence, continuous 02/26/2011  . Substance-induced disorder 02/26/2011  . Post traumatic stress disorder (PTSD) 02/26/2011  . Borderline personality disorder 02/26/2011    Assessment: Nichole Lopez is a 28 y.o.  G2P0010 at 7610w5d here for premature rupture of membranes   #Labor: cytotec 50mcg prn PO, FB when able #Pain: Nothing currently #FWB: Cat I #ID:   GBS neg #MOF: bottle #MOC:OCPs   Nichole Lopez 02/02/2014, 12:16 PM

## 2014-02-03 ENCOUNTER — Encounter (HOSPITAL_COMMUNITY): Payer: Self-pay | Admitting: Family Medicine

## 2014-02-03 ENCOUNTER — Inpatient Hospital Stay (HOSPITAL_COMMUNITY): Payer: Medicare Other | Admitting: Anesthesiology

## 2014-02-03 DIAGNOSIS — F329 Major depressive disorder, single episode, unspecified: Secondary | ICD-10-CM

## 2014-02-03 DIAGNOSIS — O4292 Full-term premature rupture of membranes, unspecified as to length of time between rupture and onset of labor: Secondary | ICD-10-CM

## 2014-02-03 DIAGNOSIS — O99344 Other mental disorders complicating childbirth: Secondary | ICD-10-CM

## 2014-02-03 DIAGNOSIS — F319 Bipolar disorder, unspecified: Secondary | ICD-10-CM

## 2014-02-03 MED ORDER — MISOPROSTOL 200 MCG PO TABS
ORAL_TABLET | ORAL | Status: AC
  Filled 2014-02-03: qty 4

## 2014-02-03 MED ORDER — ZOLPIDEM TARTRATE 5 MG PO TABS: 5.0000 mg | ORAL_TABLET | Freq: Every evening | ORAL | Status: DC | PRN

## 2014-02-03 MED ORDER — DIBUCAINE 1 % RE OINT: 1.0000 "application " | TOPICAL_OINTMENT | RECTAL | Status: DC | PRN

## 2014-02-03 MED ORDER — MISOPROSTOL 200 MCG PO TABS
800.0000 ug | ORAL_TABLET | ORAL | Status: AC | PRN
  Administered 2014-02-03: 800 ug via RECTAL

## 2014-02-03 MED ORDER — SIMETHICONE 80 MG PO CHEW
80.0000 mg | CHEWABLE_TABLET | ORAL | Status: DC | PRN
Start: 2014-02-03 — End: 2014-02-05

## 2014-02-03 MED ORDER — OXYCODONE-ACETAMINOPHEN 5-325 MG PO TABS: 2.0000 | ORAL_TABLET | ORAL | Status: DC | PRN

## 2014-02-03 MED ORDER — INFLUENZA VAC SPLIT QUAD 0.5 ML IM SUSY
0.5000 mL | PREFILLED_SYRINGE | INTRAMUSCULAR | Status: AC
  Administered 2014-02-04: 0.5 mL via INTRAMUSCULAR
  Filled 2014-02-03: qty 0.5

## 2014-02-03 MED ORDER — OXYCODONE-ACETAMINOPHEN 5-325 MG PO TABS: 1.0000 | ORAL_TABLET | ORAL | Status: DC | PRN

## 2014-02-03 MED ORDER — PRENATAL MULTIVITAMIN CH
1.0000 | ORAL_TABLET | Freq: Every day | ORAL | Status: DC
  Administered 2014-02-03 – 2014-02-05 (×3): 1 via ORAL
  Filled 2014-02-03 (×3): qty 1

## 2014-02-03 MED ORDER — TRAZODONE HCL 100 MG PO TABS
100.0000 mg | ORAL_TABLET | Freq: Every day | ORAL | Status: DC
  Administered 2014-02-03 – 2014-02-04 (×2): 100 mg via ORAL
  Filled 2014-02-03 (×3): qty 1

## 2014-02-03 MED ORDER — CITALOPRAM HYDROBROMIDE 20 MG PO TABS
20.0000 mg | ORAL_TABLET | Freq: Two times a day (BID) | ORAL | Status: DC
  Administered 2014-02-03 – 2014-02-05 (×5): 20 mg via ORAL
  Filled 2014-02-03 (×7): qty 1

## 2014-02-03 MED ORDER — TETANUS-DIPHTH-ACELL PERTUSSIS 5-2.5-18.5 LF-MCG/0.5 IM SUSP
0.5000 mL | Freq: Once | INTRAMUSCULAR | Status: DC
  Filled 2014-02-03: qty 0.5

## 2014-02-03 MED ORDER — LANOLIN HYDROUS EX OINT: TOPICAL_OINTMENT | CUTANEOUS | Status: DC | PRN

## 2014-02-03 MED ORDER — PNEUMOCOCCAL VAC POLYVALENT 25 MCG/0.5ML IJ INJ
0.5000 mL | INJECTION | INTRAMUSCULAR | Status: AC
  Administered 2014-02-04: 0.5 mL via INTRAMUSCULAR
  Filled 2014-02-03: qty 0.5

## 2014-02-03 MED ORDER — BENZOCAINE-MENTHOL 20-0.5 % EX AERO
1.0000 "application " | INHALATION_SPRAY | CUTANEOUS | Status: DC | PRN
  Administered 2014-02-03: 1 via TOPICAL
  Filled 2014-02-03: qty 56

## 2014-02-03 MED ORDER — LIDOCAINE HCL (PF) 1 % IJ SOLN
INTRAMUSCULAR | Status: DC | PRN
  Administered 2014-02-03 (×2): 4 mL

## 2014-02-03 MED ORDER — SENNOSIDES-DOCUSATE SODIUM 8.6-50 MG PO TABS
2.0000 | ORAL_TABLET | ORAL | Status: DC
  Administered 2014-02-03: 2 via ORAL
  Filled 2014-02-03 (×2): qty 2

## 2014-02-03 MED ORDER — WITCH HAZEL-GLYCERIN EX PADS: 1.0000 "application " | MEDICATED_PAD | CUTANEOUS | Status: DC | PRN

## 2014-02-03 MED ORDER — IBUPROFEN 600 MG PO TABS
600.0000 mg | ORAL_TABLET | Freq: Four times a day (QID) | ORAL | Status: DC
  Administered 2014-02-03 – 2014-02-05 (×10): 600 mg via ORAL
  Filled 2014-02-03 (×10): qty 1

## 2014-02-03 MED ORDER — FENTANYL 2.5 MCG/ML BUPIVACAINE 1/10 % EPIDURAL INFUSION (WH - ANES)
INTRAMUSCULAR | Status: DC | PRN
  Administered 2014-02-03: 14 mL/h via EPIDURAL

## 2014-02-03 MED ORDER — LURASIDONE HCL 40 MG PO TABS
40.0000 mg | ORAL_TABLET | Freq: Every day | ORAL | Status: DC
  Administered 2014-02-03 – 2014-02-05 (×3): 40 mg via ORAL
  Filled 2014-02-03 (×4): qty 1

## 2014-02-03 MED ORDER — DIPHENHYDRAMINE HCL 25 MG PO CAPS: 25.0000 mg | ORAL_CAPSULE | Freq: Four times a day (QID) | ORAL | Status: DC | PRN

## 2014-02-03 MED ORDER — ONDANSETRON HCL 4 MG/2ML IJ SOLN: 4.0000 mg | INTRAMUSCULAR | Status: DC | PRN

## 2014-02-03 MED ORDER — ONDANSETRON HCL 4 MG PO TABS
4.0000 mg | ORAL_TABLET | ORAL | Status: DC | PRN
Start: 2014-02-03 — End: 2014-02-05

## 2014-02-03 NOTE — Progress Notes (Signed)
Patient ID: Nichole Lopez, female   DOB: 1985-03-31, 28 y.o.   MRN: [id number] Now has epidural. Feels better but not quite totally comfortable  Filed Vitals:   02/03/14 0035 02/03/14 0039 02/03/14 0040 02/03/14 0044  BP: 125/71 115/62 115/62 111/75  Pulse: 77 80 67 75  Temp:      TempSrc:      Resp: 20  20 18   Height:      Weight:      SpO2: 100% 100%  100%   FHR stable 130s UCs every 2-3 min  Dilation: 5 Effacement (%): 90 Cervical Position: Posterior Station: -1 Presentation: Vertex Exam by:: Artelia LarocheM. Demarcus

## 2014-02-03 NOTE — Anesthesia Procedure Notes (Signed)
Epidural Patient location during procedure: OB Start time: 02/03/2014 12:15 AM End time: 02/03/2014 12:23 AM  Staffing Anesthesiologist: Felipe DroneJUDD, Keatyn Luck JENNETTE Performed by: anesthesiologist   Preanesthetic Checklist Completed: patient identified, site marked, surgical consent, pre-op evaluation, timeout performed, IV checked, risks and benefits discussed and monitors and equipment checked  Epidural Patient position: sitting Prep: site prepped and draped and DuraPrep Patient monitoring: continuous pulse ox and blood pressure Approach: midline Location: L3-L4 Injection technique: LOR saline  Needle:  Needle type: Tuohy  Needle gauge: 17 G Needle length: 9 cm and 9 Needle insertion depth: 10 cm Catheter type: closed end flexible Catheter size: 19 Gauge Catheter at skin depth: 15 cm Test dose: negative  Assessment Events: blood not aspirated, injection not painful, no injection resistance, negative IV test and no paresthesia  Additional Notes Patient identified. Risks/Benefits/Options discussed with patient including but not limited to bleeding, infection, nerve damage, paralysis, failed block, incomplete pain control, headache, blood pressure changes, nausea, vomiting, reactions to medication both or allergic, itching and postpartum back pain. Confirmed with bedside nurse the patient's most recent platelet count. Confirmed with patient that they are not currently taking any anticoagulation, have any bleeding history or any family history of bleeding disorders. Patient expressed understanding and wished to proceed. All questions were answered. Sterile technique was used throughout the entire procedure. Please see nursing notes for vital signs. Test dose was given through epidural catheter and negative prior to continuing to dose epidural or start infusion. Warning signs of high block given to the patient including shortness of breath, tingling/numbness in hands, complete motor block, or  any concerning symptoms with instructions to call for help. Patient was given instructions on fall risk and not to get out of bed. All questions and concerns addressed with instructions to call with any issues or inadequate analgesia.

## 2014-02-03 NOTE — Progress Notes (Signed)
Ur chart review completed.  

## 2014-02-03 NOTE — Anesthesia Preprocedure Evaluation (Addendum)
Anesthesia Evaluation  Patient identified by MRN, date of birth, ID band Patient awake    Reviewed: Allergy & Precautions, H&P , NPO status , Patient's Chart, lab work & pertinent test results  Airway Mallampati: III  TM Distance: >3 FB Neck ROM: Full    Dental no notable dental hx. (+) Dental Advisory Given   Pulmonary asthma , Current Smoker,  breath sounds clear to auscultation  Pulmonary exam normal       Cardiovascular negative cardio ROS  Rhythm:Regular Rate:Normal     Neuro/Psych PSYCHIATRIC DISORDERS Depression Bipolar Disorder negative neurological ROS     GI/Hepatic negative GI ROS, (+)     substance abuse  alcohol use,   Endo/Other  negative endocrine ROS  Renal/GU negative Renal ROS  negative genitourinary   Musculoskeletal negative musculoskeletal ROS (+)   Abdominal   Peds negative pediatric ROS (+)  Hematology negative hematology ROS (+)   Anesthesia Other Findings   Reproductive/Obstetrics (+) Pregnancy                            Anesthesia Physical Anesthesia Plan  ASA: II  Anesthesia Plan: Epidural   Post-op Pain Management:    Induction:   Airway Management Planned:   Additional Equipment:   Intra-op Plan:   Post-operative Plan:   Informed Consent: I have reviewed the patients History and Physical, chart, labs and discussed the procedure including the risks, benefits and alternatives for the proposed anesthesia with the patient or authorized representative who has indicated his/her understanding and acceptance.     Plan Discussed with: CRNA  Anesthesia Plan Comments:         Anesthesia Quick Evaluation

## 2014-02-03 NOTE — Anesthesia Postprocedure Evaluation (Signed)
Anesthesia Post Note  Patient: Nichole Lopez  Procedure(s) Performed: * No procedures listed *  Anesthesia type: Epidural  Patient location: Mother/Baby  Post pain: Pain level controlled  Post assessment: Post-op Vital signs reviewed  Last Vitals:  Filed Vitals:   02/03/14 0515  BP: 113/72  Pulse: 75  Temp:   Resp: 16    Post vital signs: Reviewed  Level of consciousness:alert  Complications: No apparent anesthesia complications

## 2014-02-04 DIAGNOSIS — O4292 Full-term premature rupture of membranes, unspecified as to length of time between rupture and onset of labor: Secondary | ICD-10-CM | POA: Diagnosis not present

## 2014-02-04 NOTE — Progress Notes (Signed)
Post Partum Day 1 Subjective: no complaints  Objective: Blood pressure 114/76, pulse 78, temperature 97.9 F (36.6 C), temperature source Oral, resp. rate 18, height 5\' 9"  (1.753 m), weight 116.574 kg (257 lb), last menstrual period 05/07/2013, SpO2 100 %, unknown if currently breastfeeding.  Physical Exam:  General: alert, cooperative and no distress Lochia: appropriate Uterine Fundus: firm DVT Evaluation: No evidence of DVT seen on physical exam.   Recent Labs  02/02/14 1251  HGB 13.0  HCT 36.6    Assessment/Plan: Plan for discharge tomorrow, Social Work consult and Contraception OCP   LOS: 2 days   Bertram Denvereague Clark, Nechelle Petrizzo E 02/04/2014, 9:50 AM

## 2014-02-04 NOTE — Progress Notes (Signed)
Clinical Social Work Department PSYCHOSOCIAL ASSESSMENT - MATERNAL/CHILD 02/04/2014  Patient:  Nichole Lopez, Nichole Lopez  Account Number:  [id number]  [place] Date:  02/02/2014  Ardine Eng Name:   Nichole Lopez   Clinical Social Worker:  Lucita Ferrara, CLINICAL SOCIAL WORKER   Date/Time:  02/04/2014 10:00 AM  Date Referred:  02/03/2014   Referral source  Central Nursery     Referred reason  Behavioral Health Issues  Substance Abuse   Other referral source:    I:  FAMILY / HOME ENVIRONMENT Child's legal guardian:  PARENT  Guardian - Name Guardian - Age Guardian - Address  Nichole Lopez [address], [place] [address]  FOB not involved     Other household support members/support persons Other support:   MOB identified the staff at "[facility]" and the other women who participate in her programming as her primary support system.    II  PSYCHOSOCIAL DATA Information Source:  Patient Interview  Insurance risk surveyor Resources Employment:   MOB is not employed. She reported that [facility] is hoping that she will become a peer support specialist in the near future.   Financial resources:  Medicaid If Medicaid - County:  [place] Other  [place] / Grade:  N/A Music therapist / Child Services Coordination / Early Interventions:   None reported  Cultural issues impacting care:   None reported    III  STRENGTHS Strengths  Adequate Resources  Home prepared for Child (including basic supplies)   Strength comment:    IV  RISK FACTORS AND CURRENT PROBLEMS Current Problem:  YES   Risk Factor & Current Problem Patient Issue Family Issue Risk Factor / Current Problem Comment  Substance Abuse Y N MOB reported etoh use until July.  She stated that she then switched to [facility], last used THC 2-3 weeks ago. Baby's UDS is negative and MDS is pending.  Mental Illness Y N MOB presents with a history of depression, bipolar,  anxiety, and PTSD.  MOB is currently prescribed Celexa, Latuda, and Trazodone, and she reported medication compliance.    V  SOCIAL WORK ASSESSMENT CSW met with the MOB due to history of etoh and THC use during pregnancy and due to extensive mental health history.  MOB was agreeable to the visit, and answered questions appropriately.  She displayed an appropriate range in affect, and was noted to be smiling when she was interacting with the baby.  MOB did not present with any acute mental health symptoms, and verbalized motivation to continue mental health treatment and to not engage in excessive etoh consumption in the postpartum period.   Per MOB, she lives alone, and discussed that she has lived alone since age 68.  She did not identify any family members as part of her support group, but shared that she has the staff from "[facility]".  She discussed that she has been receiving support from [facility] for about a year.  She shared that they ensure that she attends medical appointments, have assisted her to enroll in a GED program, have provided her with a "life coach", and discussed how they are hoping that she will be able to become a peer support specialist.  She discussed that she attends psychosocial rehabilitation groups, and spends the majority of her day during the week in groups.  She shared that she has enjoyed getting to know her peers, and have found them to  be supportive (some have visited her at the hospital).  The MOB discussed intention to continue groups since she finds them beneficial.  She denied receiving therapy, but acknowledged that she is able to do so if needed.     MOB confirmed history of depression, bipolar, anxiety, and PTSD. MOB confirmed extensive history of inpatient admissions, but denied any inpatient admissions since October 2014.  MOB denied any suicide attempts during her pregnancy. MOB was unable to recall name of her medication management provider, but  CSW noted in her OB records that she receives psychiatric care at [facility].  Per MOB, she has a follow up appointment around 02/15/14.  Per MOB, she was prescribed Celexa, Latuda, and Trazodone during the pregnancy since she was experiencing symptoms of depression (was crying all the time, and had limited motivation to get out of her home).  MOB stated that she has felt "happier" since she has been prescribed her medications, and discussed intention to continue medication in the postpartum period.  MOB presented with awareness of benefits of medication compliance, including increased happiness and increased desire to interact with others.  She denied desire to return to a depressive state, and shared awareness of potential consequences if she were to not be compliant with medications.  MOB was appropriate as CSW explored risks/benefits of taking and not taking her medications, and the MOB firmly reported intention to continue on her medication regime in order to best care for herself and the baby.   MOB acknowledged substance use during the pregnancy.  She endorsed "heavy" etoh use during the first trimester of her pregnancy since she was originally unsure if she wanted to continue the pregnancy.  She also stated that she wanted to "take away the pain", and discussed how she used to "drink to get drunk".  MOB shared that she "slowed down", once she decided to keep the baby, and discussed that she has ceased etoh use since July.  MOB shared that she then "switched" to [facility] use.  She did not clarify exact THC use, but stated that it was only "now and then".  She endorsed last THC use 2-3 weeks ago.  MOB acknowledged hospital drug screen policy, and acknowledged that a CPS report will be made if the MDS is positive for substances.  MOB denied additional substance use.  MOB stated that she did research fetal alcohol syndrome, but inquired about the risk of FAS since she did stop etoh use during the  middle of her pregnancy.  CSW encouraged the MOB to direct her questions to the MD.  Without prompting, the MOB expressed feeling proud of herself as she was able to cease etoh use.  She shared that she had never stopped consumption before, and is now proud since she has proven to herself that she can stop use.  MOB discussed intention to only drink etoh if the baby has appropriate childcare, and denied any desire to be intoxicated in the presence of the baby since she would not be able to provide appropriate childcare if she was under the influence.    MOB presented as future and goal orientated as she discussed goals of securing her GED, securing employment, and having a "good" relationship with her daughter.  She shared belief that she is working toward her goals since she is compliant with her medications and participating in programming via [facility].  She denied need to make changes to assist her work toward these goals, and denied any  barriers to her goal obtainment as she intends to continue to engage in her daily routine (medications and programming).   MOB was agreeable to additional support via [facility] and [facility].  No barriers to dishcarge.    VI SOCIAL WORK PLAN Social Work Secretary/administrator Education  Information/Referral to [employer]  No Further Intervention Required / No Barriers to Discharge   Type of pt/family education:   Postpartum depression  Hospital drug screen policy   If child protective services report - county:  N/A If child protective services report - date:  N/A Information/referral to community resources comment:   Retail banker and [facility]   Other social work plan:   CSW to monitor MDS and will make CPS report if positive. CSW to follow up PRN.    Lucita Ferrara LCSW Clinical Social Worker [phone]

## 2014-02-05 NOTE — Discharge Instructions (Signed)

## 2014-02-05 NOTE — Discharge Summary (Signed)
  Obstetric Discharge Summary Reason for Admission: rupture of membranes Prenatal Procedures: none Intrapartum Procedures: spontaneous vaginal delivery Postpartum Procedures: none Complications-Operative and Postpartum: 1st degree perineal laceration  Delivery Note Progressed quickly to complete dilation.  Pushed well to SVD.    At 3:18 AM a viable and healthy female was delivered via Vaginal, Spontaneous Delivery (Presentation: OA).  APGAR: 9, 9; weight  .   Placenta status: Intact, Spontaneous.  Cord: 3 vessels with the following complications: Hyperspiraled, and somewhat short.  Anesthesia: Epidural  Episiotomy: None Lacerations: 1st degree;Vaginal;Labial Suture Repair: 3.0 vicryl rapide Est. Blood Loss (mL): 300  Mom to postpartum.  Baby to Couplet care / Skin to Skin.  Nichole Lopez,Nichole Lopez 02/03/2014, 3:50 AM  Hospital Course:  Active Problems:   Premature rupture of membranes   Nichole Lopez is a 28 y.o. G2P1011 s/p SVD.  Patient presented to OBT  with SROM and was admitted to L&D.  She has postpartum course that was uncomplicated including no problems with ambulating, PO intake, urination, pain, or bleeding. The pt feels ready to go home and  will be discharged with outpatient follow-up.   Today: No acute events overnight.  Pt denies problems with ambulating, voiding or po intake.   Plan for birth control is  unsure, despite counseling still unsure.  Method of Feeding: bottle.   Bipolar disorder: no depression currently, has good follow up reporting she has an appointment scheduled in January 9th with psychiatry.  H/H: Lab Results  Component Value Date/Time   HGB 13.0 02/02/2014 12:51 PM   HCT 36.6 02/02/2014 12:51 PM    Discharge Diagnoses: Term Pregnancy-delivered  Discharge Information: Date: 02/05/2014 Activity: pelvic rest Diet: routine  Medications: None Breast feeding:  No, declines Condition: stable Instructions: refer to handout Discharge to:  home   Discharge Instructions    Diet - low sodium heart healthy    Complete by:  As directed      Increase activity slowly    Complete by:  As directed             Medication List    TAKE these medications        calcium carbonate 500 MG chewable tablet  Commonly known as:  TUMS - dosed in mg elemental calcium  Chew 2 tablets by mouth 2 (two) times daily as needed for indigestion or heartburn.     citalopram 20 MG tablet  Commonly known as:  CELEXA  Take 20 mg by mouth 2 (two) times daily.     lurasidone 40 MG Tabs tablet  Commonly known as:  LATUDA  Take 40 mg by mouth daily with breakfast.     traZODone 100 MG tablet  Commonly known as:  DESYREL  Take 1 tablet (100 mg total) by mouth at bedtime. For sleep.           Follow-up Information    [facility] [place] In 6 weeks.   Contact information:   [address] [place] [id number] [phone]      Perry MountACOSTA,Nichole Lopez ,MD OB Fellow 02/05/2014,9:13 AM

## 2014-04-02 ENCOUNTER — Emergency Department (HOSPITAL_COMMUNITY)
Admission: EM | Admit: 2014-04-02 | Discharge: 2014-04-03 | Disposition: A | Discharge status: Home / Self Care | Payer: Medicare Other | Attending: Emergency Medicine | Admitting: Emergency Medicine

## 2014-04-02 ENCOUNTER — Encounter (HOSPITAL_COMMUNITY): Payer: Self-pay | Admitting: Emergency Medicine

## 2014-04-02 DIAGNOSIS — J45909 Unspecified asthma, uncomplicated: Secondary | ICD-10-CM | POA: Insufficient documentation

## 2014-04-02 DIAGNOSIS — Z79899 Other long term (current) drug therapy: Secondary | ICD-10-CM | POA: Diagnosis not present

## 2014-04-02 DIAGNOSIS — Z720 Tobacco use: Secondary | ICD-10-CM | POA: Diagnosis not present

## 2014-04-02 DIAGNOSIS — R45851 Suicidal ideations: Secondary | ICD-10-CM | POA: Insufficient documentation

## 2014-04-02 DIAGNOSIS — F3113 Bipolar disorder, current episode manic without psychotic features, severe: Secondary | ICD-10-CM | POA: Diagnosis present

## 2014-04-02 NOTE — BHH Counselor (Signed)
29 [facility]. [facility] with her [facility] worker Catering manager(Amber) after calling their mobile crisis line due to SI. Pt experiencing increased PTSD and depressive Sx after the birth of her daughter 2 months ago. Pt reports SI with plan, pt unsure of intent. She has thoughts of harming others due to calls to CPS, but she reports no plan or intent. Current THC & etoh use endorsed.  [facility] [place] [facility]   Per Donell SievertSpencer Simon, PA, pt meets inpt criteria. TTS to seek placement. [facility] with possibility of availability in the AM.   Cyndie MullAnna Fillmore Bynum, [facility] Triage Specialist - [facility]

## 2014-04-02 NOTE — ED Notes (Signed)
Pt states she is feeling suicidal  Pt has some superficial cuts noted to her left wrist  Pt states she has hx of same  Pt states she has been under a lot of stress and just had a baby in December

## 2014-04-02 NOTE — ED Provider Notes (Signed)
CSN: [id number]     Arrival date & time 04/02/14  2322 History  This chart was scribed for non-physician practitioner, Emilia Beck, PA-C, working with Derwood Kaplan, MD, by Lionel December, ED Scribe. This patient was seen in room WTR4/WLPT4 and the patient's care was started at 11:57 PM.      First MD Initiated Contact with Patient 04/02/14 2336     Chief Complaint  Patient presents with  . Suicidal     (Consider location/radiation/quality/duration/timing/severity/associated sxs/prior Treatment) The history is provided by the patient. No language interpreter was used.    HPI Comments: Nichole Lopez is a 29 y.o. female who presents to the Emergency Department complaining of thoughts of suicide onset right before she got pregnant in December and worsened after. Patient notes that her plan of killing herself would be by overdosing on her pills.  Patient notes that she feels like everyone is telling her how to raise her baby.  Patient notes that she has dropped her baby and has fell on top of her. She also states that she drinks once every two weeks and smokes marijuana once a month.  Patient got into trouble with child protective services for trying to paint her nails, not dressing her properly for the winter, and thinking of dyeing her babies hair.     Past Medical History  Diagnosis Date  . Asthma   . Mental disorder PTSD Depression Schizophrenic disorder,  anxiety  . Depression   . Deliberate self-cutting   . PTSD (post-traumatic stress disorder)   . Bipolar 1 disorder    Past Surgical History  Procedure Laterality Date  . Foot surgery     Family History  Problem Relation Age of Onset  . Diabetes Other   . Depression Maternal Uncle    History  Substance Use Topics  . Smoking status: Current Every Day Smoker -- 0.50 packs/day    Types: Cigarettes  . Smokeless tobacco: Never Used  . Alcohol Use: 13.2 oz/week    12 Cans of beer, 10 Shots of liquor per week   Comment: Heavy    7/5 states is not drinking alcohol   OB History    Gravida Para Term Preterm AB TAB SAB Ectopic Multiple Living   0 1     Review of Systems  Psychiatric/Behavioral: Positive for suicidal ideas.  All other systems reviewed and are negative.     Allergies  Review of patient's allergies indicates no known allergies.  Home Medications   Prior to Admission medications   Medication Sig Start Date End Date Taking? Authorizing Provider  calcium carbonate (TUMS - DOSED IN MG ELEMENTAL CALCIUM) 500 MG chewable tablet Chew 2 tablets by mouth 2 (two) times daily as needed for indigestion or heartburn.     Historical Provider, MD  citalopram (CELEXA) 20 MG tablet Take 20 mg by mouth 2 (two) times daily.     Historical Provider, MD  lurasidone (LATUDA) 40 MG TABS tablet Take 40 mg by mouth daily with breakfast.    Historical Provider, MD  traZODone (DESYREL) 100 MG tablet Take 1 tablet (100 mg total) by mouth at bedtime. For sleep. Patient taking differently: Take 200 mg by mouth at bedtime. For sleep. 07/01/11   Verne Spurr, PA-C   There were no vitals taken for this visit. Physical Exam  Constitutional: She is oriented to person, place, and time. She appears well-developed and well-nourished. No distress.  HENT:  Head: Normocephalic and  atraumatic.  Eyes: Conjunctivae and EOM are normal.  Neck: Neck supple. No tracheal deviation present.  Cardiovascular: Normal rate.   Pulmonary/Chest: Effort normal. No respiratory distress.  Abdominal: Soft. She exhibits no distension. There is no tenderness.  Musculoskeletal: Normal range of motion.  Neurological: She is alert and oriented to person, place, and time.  Skin: Skin is warm and dry.  Psychiatric: She has a normal mood and affect. Her behavior is normal.  Nursing note and vitals reviewed.   ED Course  Procedures (including critical care time)  COORDINATION OF CARE: 12:01 AM Discussed treatment plan  with patient at beside, the patient agrees with the plan and has no further questions at this time.   Labs Review Labs Reviewed  ACETAMINOPHEN LEVEL - Abnormal; Notable for the following:    Acetaminophen (Tylenol), Serum <10.0 (*)    All other components within normal limits  CBC  COMPREHENSIVE METABOLIC PANEL  ETHANOL  SALICYLATE LEVEL  URINE RAPID DRUG SCREEN (HOSP PERFORMED)  POC URINE PREG, ED    Imaging Review No results found.   EKG Interpretation None      MDM   Final diagnoses:  Suicidal ideation   Patient will have TTS evaluation.   I personally performed the services described in this documentation, which was scribed in my presence. The recorded information has been reviewed and is accurate.    Emilia BeckKaitlyn Issabela Lesko, PA-C 04/03/14 [id number]  Derwood KaplanAnkit Nanavati, MD 04/03/14 [phone]
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
## 2014-04-03 ENCOUNTER — Inpatient Hospital Stay: Payer: Self-pay | Admitting: Psychiatry

## 2014-04-03 DIAGNOSIS — F3113 Bipolar disorder, current episode manic without psychotic features, severe: Secondary | ICD-10-CM | POA: Diagnosis not present

## 2014-04-03 DIAGNOSIS — R45851 Suicidal ideations: Secondary | ICD-10-CM | POA: Diagnosis not present

## 2014-04-03 LAB — CBC
HEMATOCRIT: 38.7 % (ref 36.0–46.0)
HEMOGLOBIN: 12.9 g/dL (ref 12.0–15.0)
MCH: 30.4 pg (ref 26.0–34.0)
MCHC: 33.3 g/dL (ref 30.0–36.0)
MCV: 91.1 fL (ref 78.0–100.0)
Platelets: 291 10*3/uL (ref 150–400)
RBC: 4.25 MIL/uL (ref 3.87–5.11)
RDW: 12.9 % (ref 11.5–15.5)
WBC: 9.2 10*3/uL (ref 4.0–10.5)

## 2014-04-03 LAB — COMPREHENSIVE METABOLIC PANEL
ALBUMIN: 4.4 g/dL (ref 3.5–5.2)
ALT: 21 U/L (ref 0–35)
ANION GAP: 7 (ref 5–15)
AST: 17 U/L (ref 0–37)
Alkaline Phosphatase: 75 U/L (ref 39–117)
BILIRUBIN TOTAL: 0.5 mg/dL (ref 0.3–1.2)
BUN: 16 mg/dL (ref 6–23)
CHLORIDE: 108 mmol/L (ref 96–112)
CO2: 26 mmol/L (ref 19–32)
Calcium: 9.7 mg/dL (ref 8.4–10.5)
Creatinine, Ser: 0.71 mg/dL (ref 0.50–1.10)
GFR calc Af Amer: >90 mL/min (ref >90)
GFR calc non Af Amer: >90 mL/min (ref >90)
Glucose, Bld: 96 mg/dL (ref 70–99)
POTASSIUM: 4.1 mmol/L (ref 3.5–5.1)
Sodium: 141 mmol/L (ref 135–145)
Total Protein: 7.4 g/dL (ref 6.0–8.3)

## 2014-04-03 LAB — RAPID URINE DRUG SCREEN, HOSP PERFORMED
Amphetamines: NOT DETECTED
BARBITURATES: NOT DETECTED
Benzodiazepines: NOT DETECTED
Cocaine: NOT DETECTED
Opiates: NOT DETECTED
Tetrahydrocannabinol: NOT DETECTED

## 2014-04-03 LAB — ACETAMINOPHEN LEVEL: Acetaminophen (Tylenol), Serum: <10 ug/mL — ABNORMAL LOW (ref 10–30)

## 2014-04-03 LAB — POC URINE PREG, ED: PREG TEST UR: NEGATIVE

## 2014-04-03 LAB — ETHANOL: Alcohol, Ethyl (B): <5 mg/dL (ref 0–9)

## 2014-04-03 LAB — SALICYLATE LEVEL: Salicylate Lvl: <4 mg/dL (ref 2.8–20.0)

## 2014-04-03 MED ORDER — ONDANSETRON HCL 4 MG PO TABS: 4.0000 mg | ORAL_TABLET | Freq: Three times a day (TID) | ORAL | Status: DC | PRN

## 2014-04-03 MED ORDER — ACETAMINOPHEN 325 MG PO TABS: 650.0000 mg | ORAL_TABLET | ORAL | Status: DC | PRN

## 2014-04-03 MED ORDER — IBUPROFEN 200 MG PO TABS: 600.0000 mg | ORAL_TABLET | Freq: Three times a day (TID) | ORAL | Status: DC | PRN

## 2014-04-03 NOTE — ED Notes (Signed)
Sheriff here to transport 

## 2014-04-03 NOTE — BH Assessment (Addendum)
[facility] Assessment Progress Note  Per Robinette Hainesalvin Manning at [facility], pt has been accepted to their facility by Dr Ardyth HarpsHernandez.  They would like to know whether pt is voluntary or involuntary.  Pt was initially under voluntary status, but when I spoke to her to discuss disposition, she reports that she wants to be discharged.  I then staffed this first with Dahlia ByesJosephine Onuoha, NP, then with EDP Kristen Ward, DO, and it was decided to initiate IVC.  Petition has been faxed to [employer] Kamali Sakata and Findings and Custody Order has been served.  IVC paperwork has been faxed to [facility].  Doylene Canninghomas Baneen Wieseler, MA Triage Specialist 04/03/2014 @ 13:39   Addendum:  Per Jerilynn Somalvin, please call report to [phone].  Pt's nurse has been notified.  Doylene Canninghomas Leliana Kontz, MA Triage Specialist 04/03/2014 @ 14:05

## 2014-04-03 NOTE — ED Notes (Addendum)
PT USED PHONE # 3

## 2014-04-03 NOTE — ED Notes (Signed)
PHONE # 2

## 2014-04-03 NOTE — ED Notes (Addendum)
CSW into see 

## 2014-04-03 NOTE — ED Notes (Signed)
NAD, resting quielty, has finished lunch.
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
## 2014-04-03 NOTE — ED Notes (Signed)
ED PA at bedside

## 2014-04-03 NOTE — ED Notes (Signed)
Patient resting quietly, chest observed for rise and fall. NAD noted.

## 2014-04-03 NOTE — ED Notes (Signed)
Psych NP at bedside

## 2014-04-03 NOTE — ED Notes (Addendum)
PT USED PHONE #1

## 2014-04-03 NOTE — ED Notes (Signed)
PT USED PHONE # 2

## 2014-04-03 NOTE — ED Notes (Addendum)
[facility]

## 2014-04-03 NOTE — ED Notes (Signed)
On the phone 

## 2014-04-03 NOTE — ED Notes (Addendum)
Pt ambulatory w/o difficulty w/ sheriff to [facility], belongings given to sheriff.  IVC, EMTELA, face sheet, MAR report, transfer report, assessment notes sent w/ sheriff.

## 2014-04-03 NOTE — BH Assessment (Signed)
Inpt recommended. [facility]. [facility]   Clista BernhardtNancy Blondie Riggsbee, [place] Triage Specialist 04/03/2014 2:09 AM

## 2014-04-03 NOTE — BHH Counselor (Addendum)
Per Nichole SievertSpencer Simon, PA, pt recommends inpt treatment. TTS to seek placement.  TTS counselor informed pt's attending RN of disposition and need to find inpt placement.   Cyndie MullAnna Ayiana Winslett, [facility] Triage Specialist

## 2014-04-03 NOTE — ED Notes (Signed)
Up to the bathroom 
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
## 2014-08-23 LAB — OB RESULTS CONSOLE VARICELLA ZOSTER ANTIBODY, IGG: VARICELLA IGG: IMMUNE

## 2014-10-17 ENCOUNTER — Encounter (HOSPITAL_COMMUNITY): Payer: Self-pay | Admitting: *Deleted

## 2014-10-17 ENCOUNTER — Inpatient Hospital Stay (HOSPITAL_COMMUNITY)
Admission: AD | Admit: 2014-10-17 | Discharge: 2014-10-17 | Disposition: A | Discharge status: Home / Self Care | Payer: Medicare Other | Source: Ambulatory Visit | Attending: Obstetrics & Gynecology | Admitting: Obstetrics & Gynecology

## 2014-10-17 DIAGNOSIS — Z3200 Encounter for pregnancy test, result unknown: Secondary | ICD-10-CM | POA: Diagnosis present

## 2014-10-17 DIAGNOSIS — Z3201 Encounter for pregnancy test, result positive: Secondary | ICD-10-CM

## 2014-10-17 LAB — URINALYSIS, ROUTINE W REFLEX MICROSCOPIC
Bilirubin Urine: NEGATIVE
GLUCOSE, UA: NEGATIVE mg/dL
HGB URINE DIPSTICK: NEGATIVE
Ketones, ur: 15 mg/dL — AB
Leukocytes, UA: NEGATIVE
Nitrite: NEGATIVE
PROTEIN: NEGATIVE mg/dL
Urobilinogen, UA: 0.2 mg/dL (ref 0.0–1.0)
pH: 6 (ref 5.0–8.0)

## 2014-10-17 LAB — POCT PREGNANCY, URINE: Preg Test, Ur: POSITIVE — AB

## 2014-10-17 NOTE — Discharge Instructions (Signed)
Pregnancy, The Father's Role A father has an important role during their partners pregnancy, labor, delivery and afterward. It is important to help and support your partner through this new period. There are many physical and emotional changes that happen. To be helpful and supportive during this time, you should know and understand what is happening to your partner during the pregnancy, labor, delivery and postpartum period.  PREGNANCY Pregnancy lasts 40 weeks (plus or minus 2 weeks). The pregnancy is divided into three trimesters.   In the first 13 weeks, the mother feels tired, has painful breasts, may feel sick to her stomach (nauseated), throw up (vomit), urinates more often and may have mood changes. All of these changes are normal. If the father is aware of these, he can be more helpful, supportive and understanding. This may include helping with household duties and activities and spending more time with each other.  In the next 14 to 28 weeks, your partner is over the tiredness, nausea and vomiting. She will likely feel better and more energetic. This is the best time of the pregnancy to be more active together, go out more often or take trips. You will be able to see her belly popping out with the pregnancy. You may be able to feel the baby kick.  In the last 12 weeks, she may become more uncomfortable again because her abdomen is popping out more as the baby grows. She may have a hard time doing household chores, her balance may be off, she may have a hard time bending over, tires easily and has a tough time sleeping. At this time, you will realize the birth of your baby is close. You and your partner may have concerns about the safety of your partner and if the baby will be normal and healthy. These are all normal and natural feelings. You should talk with each other and your caregiver if you have any questions. Attend prenatal care visits with your partner. This is a good time for you to get  to know your caregiver, follow the pregnancy and ask questions. Prenatal visits are once a month for 6 months, then they are every 2 weeks for 2 months and then once a week the last month. You may have more prenatal visits if your caregiver feels it is needed. Your caregiver usually does an ultrasound of the baby at one of the prenatal visits or more often if needed. It is an exciting and emotional to see the baby moving and the heart beating.  Fathers can experience emotional changes during this time as well. These emotions can include happiness, excitement and feeling proud. Fathers may also be concerned about having new responsibilities. These include financial, educational and if it will change the relationship with his partner. These feelings are normal. They should be talked about openly and positively with each other. An important and often asked question is if sexual intercourse is safe during pregnancy and if it will harm the baby. Sexual intercourse is safe unless there is a problem with the pregnancy and your caregiver advises you to not have sexual intercourse. Because physical and emotional changes happen in pregnancy, your partner may not want to have sex during certain times. This is mostly true in the first and third trimesters. Trying different positions may make sexual intercourse comfortable. It is important for the both of you to discuss your feelings and desires with this problem. Talk to your caregiver about any questions you have about sexual intercourse during the  pregnancy. LABOR AND DELIVERY There are childbirth classes available for couples to take together. They help you understand what happens during labor and delivery. They also teach you how to help your partner with her labor pains, how to relax, breath properly during a contraction and focus on what is happening during labor. You may be asked to time the contractions, massage her back and breath with her during the contractions.  You are also there to see and enjoy the excitement your baby being born. If you have any feelings of fainting or are uncomfortable, tell someone to help you. You may be asked to leave the room if a problem develops during the labor or delivery. Sometimes a Cesarean Section (C-section) is scheduled or is an emergency during labor and delivery. A C-section is a major operation to deliver the baby. It is done through an incision in the abdomen and uterus. Your partner will be given a medicine to make her sleep (general anesthesia) or spinal anesthesia (numbing the body from the waist down). Most hospitals allow the father in the room for a C-section unless it is an emergency. Recovery from a C-section takes longer, is more uncomfortable and will require more help from the father. AFTER DELIVERY After the baby is born, the mother goes through many changes again. These changes could last 4 to 6 weeks or longer following a C-section. It is not unusual to be anxious, concerned and afraid that you may not be taking care of your newborn baby properly. Your partner may take a while to regain her strength. She may also get feelings of sadness (postpartum blues or depression), which is a more serious condition that may require medical treatment.  Your partner may decide to breastfeed the baby. This helps with bonding between the mother and the baby. Breastfeeding is the best way to feed the baby, but you may feel "left out." However, you can feel included by burping the baby and bottle feeding the baby with breast milk (collected by the mother) to give your partner some rest. This also helps you to bond with the baby. Breastfeeding mothers can get pregnant even if they are not having menstrual periods. Therefore, some form of birth control should be used if you do not want to get pregnant. Another question and concern is when it is safe to have sexual intercourse again. Usually it takes 4 to 6 weeks for healing to be over  with. It may take longer after a C-section. If you have any questions about having sexual intercourse or if it is painful, talk to your caregiver. As a father, you will be adjusting your role as the baby grows. Fatherhood is a on-going [employer]. You and your partner should still make time to be together alone and be the couple you were before the baby was born. This is helpful for you, your partner and your baby. As you can see, it is important for a father to be helpful, understanding and supportive during this special time. Document Released: 07/13/2007 Document Revised: 04/18/2011 Document Reviewed: 07/13/2007 [facility] Patient Information 2015 [place], [place]. This information is not intended to replace advice given to you by your health care provider. Make sure you discuss any questions you have with your health care provider.  Prenatal Care  WHAT IS PRENATAL CARE?  Prenatal care means health care during your pregnancy, before your baby is born. It is very important to take care of yourself and your baby during your pregnancy by:  Getting early prenatal care. If you know you are pregnant, or think you might be pregnant, call your health care provider as soon as possible. Schedule a visit for a prenatal exam. °· Getting regular prenatal care. Follow your health care provider's schedule for blood and other necessary tests. Do not miss appointments. °· Doing everything you can to keep yourself and your baby healthy during your pregnancy. °· Getting complete care. Prenatal care should include evaluation of the medical, dietary, educational, psychological, and social needs of you and your significant other. The medical and genetic history of your family and the family of your baby's father should be discussed with your health care provider. °· Discussing with your health care provider: °¨ Prescription, over-the-counter, and herbal medicines that you take. °¨ Any history of substance abuse,  alcohol use, smoking, and illegal drug use. °¨ Any history of domestic abuse and violence. °¨ Immunizations you have received. °¨ Your nutrition and diet. °¨ The amount of exercise you do. °¨ Any environmental and occupational hazards to which you are exposed. °¨ History of sexually transmitted infections for both you and your partner. °¨ Previous pregnancies you have had. °WHY IS PRENATAL CARE SO IMPORTANT?  °By regularly seeing your health care provider, you help ensure that problems can be identified early so that they can be treated as soon as possible. Other problems might be prevented. Many studies have shown that early and regular prenatal care is important for the health of mothers and their babies.  °HOW CAN I TAKE CARE OF MYSELF WHILE I AM PREGNANT?  °Here are ways to take care of yourself and your baby:  °· Start or continue taking your multivitamin with 400 micrograms (mcg) of folic acid every day. °· Get early and regular prenatal care. It is very important to see a health care provider during your pregnancy. Your health care provider will check at each visit to make sure that you and your baby are healthy. If there are any problems, action can be taken right away to help you and your baby. °· Eat a healthy diet that includes: °¨ Fruits. °¨ Vegetables. °¨ Foods low in saturated fat. °¨ Whole grains. °¨ Calcium-rich foods, such as milk, yogurt, and hard cheeses. °· Drink 6-8 glasses of liquids a day. °· Unless your health care provider tells you not to, try to be physically active for 30 minutes, most days of the week. If you are pressed for time, you can get your activity in through 10-minute segments, three times a day. °· Do not smoke, drink alcohol, or use drugs. These can cause long-term damage to your baby. Talk with your health care provider about steps to take to stop smoking. Talk with a member of your faith community, a counselor, a trusted friend, or your health care provider if you are  concerned about your alcohol or drug use. °· Ask your health care provider before taking any medicine, even over-the-counter medicines. Some medicines are not safe to take during pregnancy. °· Get plenty of rest and sleep. °· Avoid hot tubs and saunas during pregnancy. °· Do not have X-rays taken unless absolutely necessary and with the recommendation of your health care provider. A lead shield can be placed on your abdomen to protect your baby when X-rays are taken in other parts of your body. °· Do not empty the cat litter when you are pregnant. It may contain a parasite that causes an infection called toxoplasmosis, which can cause birth defects. Also, use   gloves when working in garden areas used by cats.  Do not eat uncooked or undercooked meats or fish.  Do not eat soft, mold-ripened cheeses (Brie, Camembert, and chevre) or soft, blue-veined cheese (Danish blue and Roquefort).  Stay away from toxic chemicals like:  Insecticides.  Solvents (some cleaners or paint thinners).  Lead.  Mercury.  Sexual intercourse may continue until the end of the pregnancy, unless you have a medical problem or there is a problem with the pregnancy and your health care provider tells you not to.  Do not wear high-heel shoes, especially during the second half of the pregnancy. You can lose your balance and fall.  Do not take long trips, unless absolutely necessary. Be sure to see your health care provider before going on the trip.  Do not sit in one position for more than 2 hours when on a trip.  Take a copy of your medical records when going on a trip. Know where a hospital is located in the city you are visiting, in case of an emergency.  Most dangerous household products will have pregnancy warnings on their labels. Ask your health care provider about products if you are unsure.  Limit or eliminate your caffeine intake from coffee, tea, sodas, medicines, and chocolate.  Many women continue working  through pregnancy. Staying active might help you stay healthier. If you have a question about the safety or the hours you work at your particular job, talk with your health care provider.  Get informed:  Read books.  Watch videos.  Go to childbirth classes for you and your significant other.  Talk with experienced moms.  Ask your health care provider about childbirth education classes for you and your partner. Classes can help you and your partner prepare for the birth of your baby.  Ask about a baby doctor (pediatrician) and methods and pain medicine for labor, delivery, and possible cesarean delivery. HOW OFTEN SHOULD I SEE MY HEALTH CARE PROVIDER DURING PREGNANCY?  Your health care provider will give you a schedule for your prenatal visits. You will have visits more often as you get closer to the end of your pregnancy. An average pregnancy lasts about 40 weeks.  A typical schedule includes visiting your health care provider:   About once each month during your first 6 months of pregnancy.  Every 2 weeks during the next 2 months.  Weekly in the last month, until the delivery date. Your health care provider will probably want to see you more often if:  You are older than 35 years.  Your pregnancy is high risk because you have certain health problems or problems with the pregnancy, such as:  Diabetes.  High blood pressure.  The baby is not growing on schedule, according to the dates of the pregnancy. Your health care provider will do special tests to make sure you and your baby are not having any serious problems. WHAT HAPPENS DURING PRENATAL VISITS?   At your first prenatal visit, your health care provider will do a physical exam and talk to you about your health history and the health history of your partner and your family. Your health care provider will be able to tell you what date to expect your baby to be born on.  Your first physical exam will include checks of your  blood pressure, measurements of your height and weight, and an exam of your pelvic organs. Your health care provider will do a Pap test if you have not had one  recently and will do cultures of your cervix to make sure there is no infection.  At each prenatal visit, there will be tests of your blood, urine, blood pressure, weight, and the progress of the baby will be checked.  At your later prenatal visits, your health care provider will check how you are doing and how your baby is developing. You may have a number of tests done as your pregnancy progresses.  Ultrasound exams are often used to check on your baby's growth and health.  You may have more urine and blood tests, as well as special tests, if needed. These may include amniocentesis to examine fluid in the pregnancy sac, stress tests to check how the baby responds to contractions, or a biophysical profile to measure your baby's well-being. Your health care provider will explain the tests and why they are necessary.  You should be tested for high blood sugar (gestational diabetes) between the 24th and 28th weeks of your pregnancy.  You should discuss with your health care provider your plans to breastfeed or bottle-feed your baby.  Each visit is also a chance for you to learn about staying healthy during pregnancy and to ask questions. Document Released: 01/27/2003 Document Revised: 01/29/2013 Document Reviewed: 04/10/2013 [facility] Patient Information 2015 [place], [place]. This information is not intended to replace advice given to you by your health care provider. Make sure you discuss any questions you have with your health care provider. Safe Medications in Pregnancy   Acne: Benzoyl Peroxide Salicylic Acid  Backache/Headache: Tylenol: 2 regular strength every 4 hours OR              2 Extra strength every 6 hours  Colds/Coughs/Allergies: Benadryl (alcohol free) 25 mg every 6 hours as needed Breath right  strips Claritin Cepacol throat lozenges Chloraseptic throat spray Cold-Eeze- up to three times per day Cough drops, alcohol free Flonase (by prescription only) Guaifenesin Mucinex Robitussin DM (plain only, alcohol free) Saline nasal spray/drops Sudafed (pseudoephedrine) & Actifed ** use only after [redacted] weeks gestation and if you do not have high blood pressure Tylenol Vicks Vaporub Zinc lozenges Zyrtec   Constipation: Colace Ducolax suppositories Fleet enema Glycerin suppositories Metamucil Milk of magnesia Miralax Senokot Smooth move tea  Diarrhea: Kaopectate Imodium A-D  *NO pepto Bismol  Hemorrhoids: Anusol Anusol HC Preparation H Tucks  Indigestion: Tums Maalox Mylanta Zantac  Pepcid  Insomnia: Benadryl (alcohol free) 25mg  every 6 hours as needed Tylenol PM Unisom, no Gelcaps  Leg Cramps: Tums MagGel  Nausea/Vomiting:  Bonine Dramamine Emetrol Ginger extract Sea bands Meclizine  Nausea medication to take during pregnancy:  Unisom (doxylamine succinate 25 mg tablets) Take one tablet daily at bedtime. If symptoms are not adequately controlled, the dose can be increased to a maximum recommended dose of two tablets daily (1/2 tablet in the morning, 1/2 tablet mid-afternoon and one at bedtime). Vitamin B6 100mg  tablets. Take one tablet twice a day (up to 200 mg per day).  Skin Rashes: Aveeno products Benadryl cream or 25mg  every 6 hours as needed Calamine Lotion 1% cortisone cream  Yeast infection: Gyne-lotrimin 7 Monistat 7   **If taking multiple medications, please check labels to avoid duplicating the same active ingredients **take medication as directed on the label ** Do not exceed 4000 mg of tylenol in 24 hours **Do not take medications that contain aspirin or ibuprofen

## 2014-10-17 NOTE — MAU Note (Signed)
Need pregnancy confirmed. No other complaints.

## 2014-10-17 NOTE — MAU Provider Note (Signed)
Pt is here for pregnancy confirmation Pt denies complaints of any abdominal pain or spotting/bleeding. Pt is alert and oriented in no acute distress Pt has 27 month old in foster care but wants another baby Pt has been depressed, seeing psychiatrist and in counseling Reviewed medications listed in chart, but pt states she is on other medications Recommended pt to follow up with psychiatrist to review meds F/u with [facility] for prenatal care Return if any cramping or bleeding Imp: positive urine pregnancy  Disp- confirmation of pregnancy           F/u with OB care Jean Rosenthal, NP

## 2014-10-17 NOTE — MAU Note (Signed)
Pamelia Hoit NP in Triage to discuss plan of care and d/c from Triage.

## 2014-10-17 NOTE — MAU Note (Deleted)
UPT correct results are negative. UPT results put in QC incorrectly. Fingers slipped in gloves and pressed the wrong result. Correct UPT results put in QC after wrong results were accepted 

## 2014-11-04 ENCOUNTER — Inpatient Hospital Stay (HOSPITAL_COMMUNITY): Payer: Medicare Other

## 2014-11-04 ENCOUNTER — Encounter (HOSPITAL_COMMUNITY): Payer: Self-pay | Admitting: *Deleted

## 2014-11-04 ENCOUNTER — Inpatient Hospital Stay (HOSPITAL_COMMUNITY)
Admission: AD | Admit: 2014-11-04 | Discharge: 2014-11-04 | Disposition: A | Discharge status: Home / Self Care | Payer: Medicare Other | Source: Ambulatory Visit | Attending: Obstetrics and Gynecology | Admitting: Obstetrics and Gynecology

## 2014-11-04 DIAGNOSIS — R109 Unspecified abdominal pain: Secondary | ICD-10-CM | POA: Diagnosis present

## 2014-11-04 DIAGNOSIS — O26899 Other specified pregnancy related conditions, unspecified trimester: Secondary | ICD-10-CM

## 2014-11-04 DIAGNOSIS — O9989 Other specified diseases and conditions complicating pregnancy, childbirth and the puerperium: Secondary | ICD-10-CM | POA: Diagnosis not present

## 2014-11-04 DIAGNOSIS — Z3A01 Less than 8 weeks gestation of pregnancy: Secondary | ICD-10-CM | POA: Diagnosis not present

## 2014-11-04 DIAGNOSIS — F17210 Nicotine dependence, cigarettes, uncomplicated: Secondary | ICD-10-CM | POA: Diagnosis not present

## 2014-11-04 DIAGNOSIS — O26891 Other specified pregnancy related conditions, first trimester: Secondary | ICD-10-CM | POA: Diagnosis not present

## 2014-11-04 LAB — CBC
HEMATOCRIT: 38.2 % (ref 36.0–46.0)
HEMOGLOBIN: 12.9 g/dL (ref 12.0–15.0)
MCH: 30.1 pg (ref 26.0–34.0)
MCHC: 33.8 g/dL (ref 30.0–36.0)
MCV: 89.3 fL (ref 78.0–100.0)
Platelets: 226 10*3/uL (ref 150–400)
RBC: 4.28 MIL/uL (ref 3.87–5.11)
RDW: 12.7 % (ref 11.5–15.5)
WBC: 7.3 10*3/uL (ref 4.0–10.5)

## 2014-11-04 LAB — WET PREP, GENITAL
CLUE CELLS WET PREP: NONE SEEN
Trich, Wet Prep: NONE SEEN
YEAST WET PREP: NONE SEEN

## 2014-11-04 LAB — URINALYSIS, ROUTINE W REFLEX MICROSCOPIC
Bilirubin Urine: NEGATIVE
Glucose, UA: NEGATIVE mg/dL
Hgb urine dipstick: NEGATIVE
Ketones, ur: 15 mg/dL — AB
LEUKOCYTES UA: NEGATIVE
NITRITE: NEGATIVE
PROTEIN: NEGATIVE mg/dL
UROBILINOGEN UA: 0.2 mg/dL (ref 0.0–1.0)
pH: 5.5 (ref 5.0–8.0)

## 2014-11-04 LAB — ABO/RH: ABO/RH(D): A POS

## 2014-11-04 LAB — POCT PREGNANCY, URINE: PREG TEST UR: POSITIVE — AB

## 2014-11-04 LAB — HCG, QUANTITATIVE, PREGNANCY: HCG, BETA CHAIN, QUANT, S: 27742 m[IU]/mL — AB (ref <5)

## 2014-11-04 NOTE — Progress Notes (Signed)
Erin Lawrence NP in earlier to discuss test results and d/c plan. Written and verbal d/c instructions given and understanding voiced 

## 2014-11-04 NOTE — Discharge Instructions (Signed)
First Trimester of Pregnancy The first trimester of pregnancy is from week 1 until the end of week 12 (months 1 through 3). During this time, your baby will begin to develop inside you. At 6-8 weeks, the eyes and face are formed, and the heartbeat can be seen on ultrasound. At the end of 12 weeks, all the baby's organs are formed. Prenatal care is all the medical care you receive before the birth of your baby. Make sure you get good prenatal care and follow all of your doctor's instructions. HOME CARE  Medicines  Take medicine only as told by your doctor. Some medicines are safe and some are not during pregnancy.  Take your prenatal vitamins as told by your doctor.  Take medicine that helps you poop (stool softener) as needed if your doctor says it is okay. Diet  Eat regular, healthy meals.  Your doctor will tell you the amount of weight gain that is right for you.  Avoid raw meat and uncooked cheese.  If you feel sick to your stomach (nauseous) or throw up (vomit):  Eat 4 or 5 small meals a day instead of 3 large meals.  Try eating a few soda crackers.  Drink liquids between meals instead of during meals.  If you have a hard time pooping (constipation):  Eat high-fiber foods like fresh vegetables, fruit, and whole grains.  Drink enough fluids to keep your pee (urine) clear or pale yellow. Activity and Exercise  Exercise only as told by your doctor. Stop exercising if you have cramps or pain in your lower belly (abdomen) or low back.  Try to avoid standing for long periods of time. Move your legs often if you must stand in one place for a long time.  Avoid heavy lifting.  Wear low-heeled shoes. Sit and stand up straight.  You can have sex unless your doctor tells you not to. Relief of Pain or Discomfort  Wear a good support bra if your breasts are sore.  Take warm water baths (sitz baths) to soothe pain or discomfort caused by hemorrhoids. Use hemorrhoid cream if your  doctor says it is okay.  Rest with your legs raised if you have leg cramps or low back pain.  Wear support hose if you have puffy, bulging veins (varicose veins) in your legs. Raise (elevate) your feet for 15 minutes, 3-4 times a day. Limit salt in your diet. Prenatal Care  Schedule your prenatal visits by the twelfth week of pregnancy.  Write down your questions. Take them to your prenatal visits.  Keep all your prenatal visits as told by your doctor. Safety  Wear your seat belt at all times when driving.  Make a list of emergency phone numbers. The list should include numbers for family, friends, the hospital, and police and fire departments. General Tips  Ask your doctor for a referral to a local prenatal class. Begin classes no later than at the start of month 6 of your pregnancy.  Ask for help if you need counseling or help with nutrition. Your doctor can give you advice or tell you where to go for help.  Do not use hot tubs, steam rooms, or saunas.  Do not douche or use tampons or scented sanitary pads.  Do not cross your legs for long periods of time.  Avoid litter boxes and soil used by cats.  Avoid all smoking, herbs, and alcohol. Avoid drugs not approved by your doctor.  Visit your dentist. At home, brush your teeth   with a soft toothbrush. Be gentle when you floss. GET HELP IF:  You are dizzy.  You have mild cramps or pressure in your lower belly.  You have a nagging pain in your belly area.  You continue to feel sick to your stomach, throw up, or have watery poop (diarrhea).  You have a bad smelling fluid coming from your vagina.  You have pain with peeing (urination).  You have increased puffiness (swelling) in your face, hands, legs, or ankles. GET HELP RIGHT AWAY IF:   You have a fever.  You are leaking fluid from your vagina.  You have spotting or bleeding from your vagina.  You have very bad belly cramping or pain.  You gain or lose weight  rapidly.  You throw up blood. It may look like coffee grounds.  You are around people who have German measles, fifth disease, or chickenpox.  You have a very bad headache.  You have shortness of breath.  You have any kind of trauma, such as from a fall or a car accident. Document Released: 07/13/2007 Document Revised: 06/10/2013 Document Reviewed: 12/04/2012 [employer] Patient Information 2015 [employer]. This information is not intended to replace advice given to you by your health care provider. Make sure you discuss any questions you have with your health care provider.  

## 2014-11-04 NOTE — MAU Note (Signed)
Patient is [redacted]wks pregnant and c/o lower abdominal cramping and back pain that started yesterday.  Denies vaginal bleeding. Patient states this is second pregnancy in 8 months and is anxious about "maybe something is wrong."

## 2014-11-04 NOTE — MAU Provider Note (Signed)
History     CSN: [id number]  Arrival date and time: 11/04/14 1320   First Provider Initiated Contact with Patient 11/04/14 1351       Chief Complaint  Patient presents with  . Abdominal Cramping  . Back Pain  . Anxiety   HPI  Nichole Lopez is a 29 y.o. G3P1011 at [redacted]w[redacted]d who presents for abdominal pain. Lower abdominal cramping since last night, more on left side. Intermittent pain. Rates as 5/10. No treatment.  Denies vaginal bleeding or discharge.   OB History    Gravida Para Term Preterm AB TAB SAB Ectopic Multiple Living   0 1      Past Medical History  Diagnosis Date  . Asthma   . Mental disorder PTSD Depression Schizophrenic disorder,  anxiety  . Depression   . Deliberate self-cutting   . PTSD (post-traumatic stress disorder)   . Bipolar 1 disorder     Past Surgical History  Procedure Laterality Date  . Foot surgery      Family History  Problem Relation Age of Onset  . Diabetes Other   . Depression Maternal Uncle     Social History  Substance Use Topics  . Smoking status: Current Every Day Smoker -- 1.00 packs/day for 10 years    Types: Cigarettes  . Smokeless tobacco: Never Used  . Alcohol Use: Yes     Comment: social    Allergies: No Known Allergies  Prescriptions prior to admission  Medication Sig Dispense Refill Last Dose  . sertraline (ZOLOFT) 25 MG tablet Take 100 mg by mouth 2 (two) times daily.    11/04/2014 at Unknown time  . calcium carbonate (TUMS - DOSED IN MG ELEMENTAL CALCIUM) 500 MG chewable tablet Chew 2 tablets by mouth 2 (two) times daily as needed for indigestion or heartburn.    More than a month at Unknown time  . lurasidone (LATUDA) 40 MG TABS tablet Take 40 mg by mouth daily with breakfast.   04/02/2014 at Unknown time  . traZODone (DESYREL) 100 MG tablet Take 1 tablet (100 mg total) by mouth at bedtime. For sleep. (Patient taking differently: Take 200 mg by mouth at bedtime. For sleep.) 30 tablet 0 2015 at  unknown    Review of Systems  Constitutional: Negative.  Negative for fever.  Gastrointestinal: Positive for abdominal pain. Negative for nausea, vomiting, diarrhea and constipation.  Genitourinary: Negative for dysuria.       No vaginal bleeding or discharge   Physical Exam   Blood pressure 128/77, pulse 98, temperature 98.9 F (37.2 C), temperature source Oral, resp. rate 16, height  (1.753 m), weight 234 lb 3.2 oz (106.232 kg), last menstrual period 09/23/2014, SpO2 100 %, unknown if currently breastfeeding.  Physical Exam  Nursing note and vitals reviewed. Constitutional: She is oriented to person, place, and time. She appears well-developed and well-nourished. No distress.  HENT:  Head: Normocephalic and atraumatic.  Eyes: Conjunctivae are normal. Right eye exhibits no discharge. Left eye exhibits no discharge. No scleral icterus.  Neck: Normal range of motion.  Cardiovascular: Normal rate, regular rhythm and normal heart sounds.   No murmur heard. Respiratory: Effort normal and breath sounds normal. No respiratory distress. She has no wheezes.  GI: Soft. Bowel sounds are normal. She exhibits no distension. There is no tenderness.  Genitourinary: Vagina normal and uterus normal. Cervix exhibits discharge (small amount of thin white discharge). Cervix exhibits no motion tenderness and no friability.  Cervix closed  Neurological: She is alert and oriented to person, place, and time.  Skin: Skin is warm and dry. She is not diaphoretic.  Psychiatric: She has a normal mood and affect. Her behavior is normal. Judgment and thought content normal.    MAU Course  Procedures Results for orders placed or performed during the hospital encounter of 11/04/14 (from the past 24 hour(s))  Urinalysis, Routine w reflex microscopic (not at [facility])     Status: Abnormal   Collection Time: 11/04/14  1:25 PM  Result Value Ref Range   Color, Urine YELLOW YELLOW   APPearance HAZY (A) CLEAR    Specific Gravity, Urine >1.030 (H) 1.005 - 1.030   pH 5.5 5.0 - 8.0   Glucose, UA NEGATIVE NEGATIVE mg/dL   Hgb urine dipstick NEGATIVE NEGATIVE   Bilirubin Urine NEGATIVE NEGATIVE   Ketones, ur 15 (A) NEGATIVE mg/dL   Protein, ur NEGATIVE NEGATIVE mg/dL   Urobilinogen, UA 0.2 0.0 - 1.0 mg/dL   Nitrite NEGATIVE NEGATIVE   Leukocytes, UA NEGATIVE NEGATIVE  Pregnancy, urine POC     Status: Abnormal   Collection Time: 11/04/14  1:44 PM  Result Value Ref Range   Preg Test, Ur POSITIVE (A) NEGATIVE  Wet prep, genital     Status: Abnormal   Collection Time: 11/04/14  2:05 PM  Result Value Ref Range   Yeast Wet Prep HPF POC NONE SEEN NONE SEEN   Trich, Wet Prep NONE SEEN NONE SEEN   Clue Cells Wet Prep HPF POC NONE SEEN NONE SEEN   WBC, Wet Prep HPF POC FEW (A) NONE SEEN   US Ob Comp Less 14 Wks  11/04/2014   CLINICAL DATA:  Pregnant patient with lower abdominal cramping and back pain beginning 11/03/2014. Quantitative HCG pending.  EXAM: OBSTETRIC <14 WK [place] AND TRANSVAGINAL OB US  TECHNIQUE: Both transabdominal and transvaginal ultrasound examinations were performed for complete evaluation of the gestation as well as the maternal uterus, adnexal regions, and pelvic cul-de-sac. Transvaginal technique was performed to assess early pregnancy.  COMPARISON:  None.  FINDINGS: Intrauterine gestational sac: Visualized/normal in shape.  Yolk sac:  Visualized.  Embryo:  Visualized.  Cardiac Activity: Detected.  Heart Rate: Cannot be traced due to the embryo's small size.  CRL:  3.6  mm   6 w   0 d                  [place] EDC: 06/30/2015  Maternal uterus/adnexae: Unremarkable. Corpus luteum cyst on the right incidentally noted. A small volume of free pelvic fluid is seen.  IMPRESSION: Single living anterior pregnancy.  No acute abnormality.   Electronically Signed   By: Drusilla Kanner M.D.   On: 11/04/2014 14:43   US Ob Transvaginal  11/04/2014   CLINICAL DATA:  Pregnant patient with lower abdominal cramping  and back pain beginning 11/03/2014. Quantitative HCG pending.  EXAM: OBSTETRIC <14 WK [place] AND TRANSVAGINAL OB US  TECHNIQUE: Both transabdominal and transvaginal ultrasound examinations were performed for complete evaluation of the gestation as well as the maternal uterus, adnexal regions, and pelvic cul-de-sac. Transvaginal technique was performed to assess early pregnancy.  COMPARISON:  None.  FINDINGS: Intrauterine gestational sac: Visualized/normal in shape.  Yolk sac:  Visualized.  Embryo:  Visualized.  Cardiac Activity: Detected.  Heart Rate: Cannot be traced due to the embryo's small size.  CRL:  3.6  mm   6 w   0 d  [place] EDC: 06/30/2015  Maternal uterus/adnexae: Unremarkable. Corpus luteum cyst on the right incidentally noted. A small volume of free pelvic fluid is seen.  IMPRESSION: Single living anterior pregnancy.  No acute abnormality.   Electronically Signed   By: Drusilla Kanner M.D.   On: 11/04/2014 14:43     MDM A positive CBC, BHCG, abo/rh, HIV, GC/CT & wet prep Ultrasound - [facility]  Assessment and Plan  A: 1. Abdominal pain in pregnancy    P: Discharge home Schedule [facility] care with [facility] Discussed reasons to return to MAU Increase water intake and decrease caffeine intake Keep schedule appointment with psychiatrist.  Continue prenatal vitamins.  Take tylenol prn pain per package instructions GC/CT & HIV pending  Judeth Horn, NP  11/04/2014, 1:51 PM

## 2014-11-05 LAB — HIV ANTIBODY (ROUTINE TESTING W REFLEX): HIV Screen 4th Generation wRfx: NONREACTIVE

## 2014-11-05 LAB — GC/CHLAMYDIA PROBE AMP (~~LOC~~) NOT AT ARMC
CHLAMYDIA, DNA PROBE: NEGATIVE
NEISSERIA GONORRHEA: NEGATIVE

## 2014-11-13 ENCOUNTER — Other Ambulatory Visit (HOSPITAL_COMMUNITY): Payer: Self-pay | Admitting: Nurse Practitioner

## 2014-11-13 DIAGNOSIS — Z3682 Encounter for antenatal screening for nuchal translucency: Secondary | ICD-10-CM

## 2014-11-13 DIAGNOSIS — Z3A13 13 weeks gestation of pregnancy: Secondary | ICD-10-CM

## 2014-11-13 LAB — OB RESULTS CONSOLE GC/CHLAMYDIA: Gonorrhea: NEGATIVE

## 2014-11-13 LAB — OB RESULTS CONSOLE RPR: RPR: NONREACTIVE

## 2014-11-13 LAB — GLUCOSE, 1 HOUR: GLUCOSE 1 HOUR: 87

## 2014-11-23 LAB — CULTURE, OB URINE: URINE CULTURE, OB: NEGATIVE

## 2014-12-04 ENCOUNTER — Encounter: Payer: Self-pay | Admitting: Obstetrics & Gynecology

## 2014-12-04 ENCOUNTER — Ambulatory Visit (INDEPENDENT_AMBULATORY_CARE_PROVIDER_SITE_OTHER): Payer: Medicare Other | Admitting: Obstetrics & Gynecology

## 2014-12-04 VITALS — BP 122/77 | HR 88 | Temp 98.9°F | Wt 232.6 lb

## 2014-12-04 DIAGNOSIS — O0990 Supervision of high risk pregnancy, unspecified, unspecified trimester: Secondary | ICD-10-CM | POA: Insufficient documentation

## 2014-12-04 DIAGNOSIS — O0971 Supervision of high risk pregnancy due to social problems, first trimester: Secondary | ICD-10-CM | POA: Diagnosis not present

## 2014-12-04 LAB — POCT URINALYSIS DIP (DEVICE)
Glucose, UA: NEGATIVE mg/dL
HGB URINE DIPSTICK: NEGATIVE
Nitrite: NEGATIVE
Protein, ur: 30 mg/dL — AB
UROBILINOGEN UA: 0.2 mg/dL (ref 0.0–1.0)
pH: 5.5 (ref 5.0–8.0)

## 2014-12-04 NOTE — Progress Notes (Signed)
Subjective:  Nichole Lopez is a 29 y.o. G3P1011 at 7054w2d being seen today for transfer of prenatal care for bipolar and social issues.  Patient reports no complaints.  Contractions: Not present.  Vag. Bleeding: None.  . Denies leaking of fluid.   The following portions of the patient's history were reviewed and updated as appropriate: allergies, current medications, past family history, past medical history, past social history, past surgical history and problem list. Problem list updated.  Objective:   Filed Vitals:   12/04/14 0926  BP: 122/77  Pulse: 88  Temp: 98.9 F (37.2 C)  Weight: 232 lb 9.6 oz (105.507 kg)    Fetal Status:        + FH by US  General:  Alert, oriented and cooperative. Patient is in no acute distress.  Skin: Skin is warm and dry. No rash noted.   Cardiovascular: Normal heart rate noted  Respiratory: Normal respiratory effort, no problems with respiration noted  Abdomen: Soft, gravid, appropriate for gestational age. Pain/Pressure: Present     Pelvic: Vag. Bleeding: None     Cervical exam deferred        Extremities: Normal range of motion.  Edema: None  Mental Status: Normal mood and affect. Normal behavior. Normal judgment and thought content.   Urinalysis: Urine Protein: 1+ Urine Glucose: Negative  Assessment and Plan:  Pregnancy: G3P1011 at 3654w2d  1. Supervision of high risk pregnancy due to social problems in first trimester -Pt has had medications changed by psychiatrist and she is doing well with her bipolar.  No depression. -Continue counseling -Call psych with changes in mood  2.  First screen scheduled by HD  3.  Bedside US for viability (unable to hear with doppler)  4.  Smoking -Pt on nicotine patches -Avoid cigs adn e cigs.  5. Reveiwed all prenatal records dn US.  Preterm labor symptoms and general obstetric precautions including but not limited to vaginal bleeding, contractions, leaking of fluid and fetal movement were reviewed  in detail with the patient. Please refer to After Visit Summary for other counseling recommendations.  No Follow-up on file.   Lesly DukesKelly H Jaynee Winters, MD

## 2014-12-04 NOTE — Progress Notes (Signed)
New ob packet given  Pt reports having to change her cat litter because she has "no one to help, I have no family" Pt also reports having her child in foster care at this time Pt smokes 1-3 cigarettes a day and uses e-cigarette most of the time

## 2014-12-04 NOTE — Progress Notes (Signed)
Nutrition note: 1st visit consult Pt has h/o obesity. Pt has gained 2.6# @ 5851w2d, which is wnl. Pt reports that she does not eat a lot right now because she does not have food but is not interested in getting WIC currently - she stated that they only provide healthy foods that she does not eat and she believes that when women eat healthy during pregnancy, their babies do not gain weight and are not healthy. Discussed the importance of eating all the food groups for good wt gain and development and that there are a lot of factors with fetal growth. Listed the foods WIC provides and encouraged pt to think about it and sign up if she changes her mind ([facility] that is what her baby needs). Pt is taking a PNV. Pt reports no N&V but has heartburn. Pt received verbal & written education on general nutrition during pregnancy. Discussed wt gain goals of 11-20# or 0.5#/wk in 2nd & 3rd trimester. Pt agrees to continue taking PNV & [facility]. F/u as needed Nichole RevealLaura Yoanna Jurczyk, MS, RD, LDN, [facility]CLC

## 2014-12-05 ENCOUNTER — Encounter: Payer: Self-pay | Admitting: *Deleted

## 2014-12-24 ENCOUNTER — Other Ambulatory Visit (HOSPITAL_COMMUNITY): Payer: Self-pay

## 2014-12-25 ENCOUNTER — Ambulatory Visit (HOSPITAL_COMMUNITY)
Admission: RE | Admit: 2014-12-25 | Discharge: 2014-12-25 | Disposition: A | Discharge status: Home / Self Care | Payer: Medicare Other | Source: Ambulatory Visit | Attending: Nurse Practitioner | Admitting: Nurse Practitioner

## 2014-12-25 ENCOUNTER — Encounter (HOSPITAL_COMMUNITY): Payer: Self-pay

## 2014-12-25 VITALS — BP 128/71 | HR 74 | Wt 229.8 lb

## 2014-12-25 DIAGNOSIS — Z3A13 13 weeks gestation of pregnancy: Secondary | ICD-10-CM | POA: Diagnosis not present

## 2014-12-25 DIAGNOSIS — Z36 Encounter for antenatal screening of mother: Secondary | ICD-10-CM | POA: Insufficient documentation

## 2014-12-25 DIAGNOSIS — O99322 Drug use complicating pregnancy, second trimester: Secondary | ICD-10-CM | POA: Diagnosis not present

## 2014-12-25 DIAGNOSIS — Z315 Encounter for genetic counseling: Secondary | ICD-10-CM | POA: Diagnosis not present

## 2014-12-25 DIAGNOSIS — O99311 Alcohol use complicating pregnancy, first trimester: Secondary | ICD-10-CM

## 2014-12-25 DIAGNOSIS — O99340 Other mental disorders complicating pregnancy, unspecified trimester: Secondary | ICD-10-CM | POA: Diagnosis not present

## 2014-12-25 DIAGNOSIS — O99321 Drug use complicating pregnancy, first trimester: Secondary | ICD-10-CM

## 2014-12-25 DIAGNOSIS — O09891 Supervision of other high risk pregnancies, first trimester: Secondary | ICD-10-CM

## 2014-12-25 DIAGNOSIS — Z3682 Encounter for antenatal screening for nuchal translucency: Secondary | ICD-10-CM

## 2014-12-29 ENCOUNTER — Encounter (HOSPITAL_COMMUNITY): Payer: Self-pay

## 2014-12-29 DIAGNOSIS — O09891 Supervision of other high risk pregnancies, first trimester: Secondary | ICD-10-CM | POA: Insufficient documentation

## 2014-12-29 DIAGNOSIS — O99311 Alcohol use complicating pregnancy, first trimester: Secondary | ICD-10-CM | POA: Insufficient documentation

## 2014-12-29 NOTE — Progress Notes (Signed)
Genetic Counseling  High-Risk Gestation Note  Appointment Date:  12/25/2014 Referred By: Nichole Click, NP Date of Birth:  04/11/1985   Pregnancy History: G3P1011 Estimated Date of Delivery: 06/30/15 Estimated Gestational Age: 42w2dAttending: PBenjaman Lobe MD   I met with Ms. Nichole Lopez genetic counseling because of medication and alcohol exposure in pregnancy.   In Summary:   Patient reported taking the following medications in pregnancy: vyvanse, trazodone, trileptal (now discontinued), geodon, latuda, zoloft, ibuprofen  Reported multiple occasions of alcohol use in first trimester  Patient reported her daughter has congenital heart disease, no surgical correction reportedly needed  First trimester screening performed today  Detailed ultrasound scheduled for 01/29/15  Fetal echocardiogram scheduled for 02/19/15 with [facility] given alcohol exposure and family history  We began by reviewing the pregnancy history. Nichole Lopez taking the following medications in pregnancy: Vyvanse, Geodon, Zoloft, Latuda, Trazodone, Trileptal, and ibuprofen. She reported that she discontinued use of trileptal following her first prenatal visit, at which time she was approximately [redacted] weeks gestation. She reported that she is followed by psychiatry and that these medications are managed through her psychiatrist.  Nichole Lopez reported drinking alcohol "a couple times per week" during the first 3-4 weeks of pregnancy, prior to being aware of the pregnancy. She reported that she also drank 2 glasses of wine two weeks ago and also drank one bottle of an alcoholic beverage that was 4% alcohol.   Before assessing any risk to a pregnancy, it is important to know that any time a couple conceives, there is an approximate 3-5% background chance that the baby will be affected by a birth defect or have intellectual disability. For this reason, it is often difficult to  determine if a drug or exposure is responsible for causing a pregnancy loss or birth defect. Therefore, in many cases, we cannot provide specific information and can only caution about possible associations between a certain exposure and a specific birth defect. We discussed that the critical period for fetal organ development is from the third through the eighth weeks after conception. Exposures during this time might have the potential for disrupting the beginning of the development of the biological systems.   Many factors contribute to what the effects, if any, a prenatal exposure will have on a pregnancy. Drug dosage (the quantity of drug exposed), timing (when during pregnancy did the exposure occur), the number of exposures to a substance, any drug-drug interactions with other substances taken during pregnancy, the genetic make-up of the mother, and the genetic make-up of a fetus all contribute to the consequences of any prenatal exposure(s). We discussed that we are not able to assess all of these factors when assessing the risk of an exposure in pregnancy. We discussed that we can evaluate the available medical literature for associations with each exposure separately, but it is difficult to assess potential interactions among the exposures.   We discussed that data regarding prenatal use of Vyvanse are limited. However, Vyvanse is in the class of amphetamines. Available animal data show potential effects on decreased weight with prenatal use, and limited human data have shown possible associations with intrauterine growth restriction, effects on neonatal behavior, and CNS development. However, these effects are associated with amphetamine abuse in pregnancy, and additional information is needed regarding potential effects from therapeutic doses in pregnancy. Available animal studies and limited human data have not indicated an expected increased risk for birth defects associated with prenatal use of  trazodone.  Experimental animal studies regarding prenatal use of Geodon (ziprasidone) have demonstrated interference with embryo development, but human data are limited. Experimental animal studies have not indicated an expected association with prenatal use of Latuda (lurasidone) and increased risk for birth defects.   Based on available study data of use on Zoloft during human pregnancy, no apparent association with increased risk of birth defects is suggested. Use of Zoloft and other serotonin reuptake inhibitors late in gestation has been associated with an increased risk of mild transient neonatal syndrome of central nervous system including irritability, vomiting, jitteriness and convulsions, as well as neonatal pulmonary hypertension. These associated symptoms typically do not appear to occur frequently or severely.   Trileptal (oxycarbazepine) is structurally similar to carbamazepine. Animal studies suggest that oxycarbazepine has the same potential as carbamazepine to increase the chance for birth defects with prenatal use. We discussed that carbamazepine use during early pregnancy has been associated with an increased risk for open neural tube defects. Available studies have suggested an approximate 1% risk for ONTDs with use in the first trimester of pregnancy. Craniofacial abnormalities and developmental delay have been associated in some but not all studies with prenatal use of carbamazepine. We reviewed the typical timing in gestation for formation of these body systems in pregnancy.   Prenatal alcohol exposure can increase the risk for growth delays, small head size, heart defects, eye and facial differences, as well as behavior problems and learning disabilities. The risk of these to occur tends to increase with the amount of alcohol consumed. However, because there is no identified safe amount of alcohol in pregnancy, it is recommended to completely avoid alcohol in pregnancy. Given the  reported amount of exposure, risk for associated effects are increased in the current pregnancy.  We discussed the option of detailed ultrasound in the second trimester to assess fetal growth and development including assessing fetal heart, fetal face, and fetal spine. We also discussed the screening option of MSAFP to assess for open neural tube defects. We also discussed the option of fetal echocardiogram in the pregnancy given the alcohol exposure during the first trimester. We discussed that ultrasound cannot diagnose or rule out all birth defects or genetic conditions in pregnancy. Detailed ultrasound is scheduled for 01/29/15, and fetal echocardiogram with [facility] is scheduled for 02/19/15.   Even though a limited number of medicines are known to cause birth defects, we cannot say that it is completely safe to use any medicines during pregnancy.  It is also not possible to predict any drug-drug interactions that occur, or how they might affect a pregnancy.  The use of medications is recommended in pregnancy only if the benefit to the mother (and thus the pregnancy) outweighs potential risks to the baby.  Sometimes the maternal use of medications, dictated by a medical condition, may even be more beneficial to a pregnancy than not taking the medication(s) at all. We reviewed that Nichole Lopez should not alter her medication without first consulting with her physician.   We discussed the additional screening option of first trimester screening. She understands that screening adjusts the a priori risk for these conditions to provide pregnancy specific risk assessment but is not diagnostic for these conditions. After careful consideration, she elected to pursue first trimester screening today.   Both family histories were reviewed in detail. The patient reported that her 77 month old daughter, with a different partner from the current pregnancy, had a hole in the heart. She is currently 10  months  and is followed by [facility]. The patient reported that it is expected to close on its own, and she is not expected to require surgical correction. The patient reported that she also had alcohol exposure during that pregnancy. Her daughter is reportedly otherwise healthy. Congenital heart defects occur in approximately 1% of pregnancies.  Congenital heart defects may occur due to multifactorial influences, chromosomal abnormalities, genetic syndromes or environmental exposures.  Isolated heart defects are generally multifactorial.  Given the reported family history and assuming multifactorial inheritance, the risk for a congenital heart defect in the current pregnancy (a second degree relative) would be approximately 1-2%. We discussed the options of targeted ultrasound in the second trimester and fetal echocardiogram to assess fetal heart development in pregnancy. She understands that ultrasound cannot diagnose or rule out all birth defects, including all fetal heart defects, prenatally.   Nichole Lopez did not have information regarding the medical history for the father of the pregnancy and did not have information regarding his family history. We thus, cannot comment on how the father of the pregnancy's history may contribute to the risk for birth defects or genetic conditions. The family histories were otherwise found to be noncontributory for birth defects, intellectual disability, and known genetic conditions. Without further information regarding the provided family history, an accurate genetic risk cannot be calculated. Further genetic counseling is warranted if more information is obtained.  Nichole Lopez was provided with written information regarding sickle cell anemia (SCA) including the carrier frequency and incidence in the African-American population, the availability of carrier testing and prenatal diagnosis if indicated.  In addition, we discussed that hemoglobinopathies  are routinely screened for as part of the [place] newborn screening panel.  She declined additional discussion of this information or screening at this time.   I counseled Nichole Lopez regarding the above risks and available options.  The approximate face-to-face time with the genetic counselor was 50 minutes.  Chipper Oman, MS Certified Genetic Counselor 12/29/2014

## 2014-12-31 ENCOUNTER — Other Ambulatory Visit (HOSPITAL_COMMUNITY): Payer: Self-pay

## 2015-01-08 ENCOUNTER — Ambulatory Visit (INDEPENDENT_AMBULATORY_CARE_PROVIDER_SITE_OTHER): Payer: Medicare Other | Admitting: Family

## 2015-01-08 VITALS — BP 112/82 | HR 93 | Temp 98.6°F | Wt 225.0 lb

## 2015-01-08 DIAGNOSIS — O0992 Supervision of high risk pregnancy, unspecified, second trimester: Secondary | ICD-10-CM

## 2015-01-08 LAB — POCT URINALYSIS DIP (DEVICE)
Glucose, UA: NEGATIVE mg/dL
HGB URINE DIPSTICK: NEGATIVE
NITRITE: NEGATIVE
PH: 7 (ref 5.0–8.0)
Protein, ur: NEGATIVE mg/dL
SPECIFIC GRAVITY, URINE: 1.025 (ref 1.005–1.030)
Urobilinogen, UA: 0.2 mg/dL (ref 0.0–1.0)

## 2015-01-08 NOTE — Progress Notes (Signed)
Pt is wanting to take Ritalin instead of Vyvanse

## 2015-01-08 NOTE — Progress Notes (Signed)
Subjective:  Nichole Lopez is a 29 y.o. G3P1011 at 7535w2d being seen today for ongoing prenatal care.  She is currently monitored for the following issues for this high-risk pregnancy and has Alcohol dependence, continuous (HCC); Substance-induced disorder (HCC); Post traumatic stress disorder (PTSD); Borderline personality disorder; Bipolar disorder;  Supervision of high risk pregnancy due to social problems in first trimester; Alcohol consumption during pregnancy in first trimester; and Medication exposure during first trimester of pregnancy on her problem list.  Patient reports no complaints. Request to be taken off of Vyvanse and put on Ritalin.   Contractions: Not present. Vag. Bleeding: None.   . Denies leaking of fluid.   The following portions of the patient's history were reviewed and updated as appropriate: allergies, current medications, past family history, past medical history, past social history, past surgical history and problem list. Problem list updated.  Objective:   Filed Vitals:   01/08/15 1133  BP: 112/82  Pulse: 93  Temp: 98.6 F (37 C)  Weight: 225 lb (102.059 kg)    Fetal Status: Fetal Heart Rate (bpm): 154 Fundal Height: 16 cm       General:  Alert, oriented and cooperative. Patient is in no acute distress.  Skin: Skin is warm and dry. No rash noted.   Cardiovascular: Normal heart rate noted  Respiratory: Normal respiratory effort, no problems with respiration noted  Abdomen: Soft, gravid, appropriate for gestational age. Pain/Pressure: Present     Pelvic: Vag. Bleeding: None     Cervical exam deferred        Extremities: Normal range of motion.  Edema: None  Mental Status: Normal mood and affect. Normal behavior. Normal judgment and thought content.   Urinalysis: Urine Protein: Negative Urine Glucose: Negative  Assessment and Plan:  Pregnancy: G3P1011 at 9235w2d  1. Supervision of high risk pregnancy, antepartum, second trimester - Drug Screen, Urine  -  Anatomy ultrasound scheduled  2. ADD - May change to Ritalin; pt plans to see psychiatrist next week  General obstetric precautions including but not limited to vaginal bleeding and pelvic pain reviewed in detail with the patient.  Return in about 3 weeks (around 01/29/2015).   Nichole Lopez, CNM

## 2015-01-09 LAB — DRUG SCREEN, URINE
Amphetamine Screen, Ur: POSITIVE — AB
BARBITURATE QUANT UR: NEGATIVE
Benzodiazepines.: NEGATIVE
CREATININE, U: 309.59 mg/dL
Cocaine Metabolites: NEGATIVE
MARIJUANA METABOLITE: NEGATIVE
METHADONE: NEGATIVE
Opiates: NEGATIVE
PROPOXYPHENE: NEGATIVE
Phencyclidine (PCP): NEGATIVE

## 2015-01-12 ENCOUNTER — Encounter: Payer: Self-pay | Admitting: Family

## 2015-01-12 DIAGNOSIS — O99320 Drug use complicating pregnancy, unspecified trimester: Secondary | ICD-10-CM

## 2015-01-12 DIAGNOSIS — F1910 Other psychoactive substance abuse, uncomplicated: Secondary | ICD-10-CM | POA: Insufficient documentation

## 2015-01-19 ENCOUNTER — Telehealth: Payer: Self-pay | Admitting: *Deleted

## 2015-01-19 NOTE — Telephone Encounter (Signed)
Called patient back and she stated that she was finally able to urinate enough for the sample, no letter needed.

## 2015-01-19 NOTE — Telephone Encounter (Signed)
Pt calling to request a letter for court stating that she cannot urinate 30 mL for a drug screen. Would like the letter mailed to her.

## 2015-01-29 ENCOUNTER — Ambulatory Visit (HOSPITAL_COMMUNITY)
Admission: RE | Admit: 2015-01-29 | Discharge: 2015-01-29 | Disposition: A | Discharge status: Home / Self Care | Payer: Medicare Other | Source: Ambulatory Visit | Attending: Internal Medicine | Admitting: Internal Medicine

## 2015-01-29 ENCOUNTER — Encounter: Payer: Self-pay | Admitting: Obstetrics & Gynecology

## 2015-01-29 ENCOUNTER — Encounter (HOSPITAL_COMMUNITY): Payer: Self-pay

## 2015-01-29 DIAGNOSIS — Z3A18 18 weeks gestation of pregnancy: Secondary | ICD-10-CM | POA: Insufficient documentation

## 2015-01-29 DIAGNOSIS — O99322 Drug use complicating pregnancy, second trimester: Secondary | ICD-10-CM | POA: Insufficient documentation

## 2015-01-29 DIAGNOSIS — Z36 Encounter for antenatal screening of mother: Secondary | ICD-10-CM | POA: Diagnosis not present

## 2015-01-29 DIAGNOSIS — F1920 Other psychoactive substance dependence, uncomplicated: Secondary | ICD-10-CM | POA: Diagnosis not present

## 2015-01-29 DIAGNOSIS — O99342 Other mental disorders complicating pregnancy, second trimester: Secondary | ICD-10-CM | POA: Insufficient documentation

## 2015-01-29 DIAGNOSIS — O99321 Drug use complicating pregnancy, first trimester: Secondary | ICD-10-CM

## 2015-02-08 NOTE — L&D Delivery Note (Signed)
Delivery Note At 10:56 PM a viable and healthy female was delivered via Vaginal, Spontaneous Delivery (Presentation: Right Occiput Anterior).  APGAR: 6, 9; weight 7 lb 13.6 oz (3560 g).   Placenta status: Intact, Spontaneous.  Cord: 3 vessels with the following complications: None.   Anesthesia: Epidural  Episiotomy: None Lacerations: None Suture Repair: n/a Est. Blood Loss (mL):  200  Mom to postpartum.  Baby to Couplet care / Skin to Skin.  Nichole Lopez is a 30 y.o. female G3P1011 with IUP at 6375w5d admitted for PROM.  She progressed with augmentation to complete and pushed less than 20 minutes to deliver.  Cord clamping delayed by a few minutes but clamped and cut quickly by CNM due to poor infant transition.  Infant remained in mother's arms for stimulation/resuscitation initially but was then taken to the warmer for additional resuscitation.  Within a few minutes, infant transitioning well and returned to mother's arms.  Placenta intact and spontaneous, bleeding minimal.  Intact perineum.  Mom and baby stable prior to transfer to postpartum. She plans on formula feeding. She requests condoms for birth control.  Discussed LARCs with pt as most effective forms of birth control.  Pt considering IUD.  LEFTWICH-KIRBY, Nichole Lopez 06/14/2015, 12:22 AM

## 2015-02-11 ENCOUNTER — Ambulatory Visit (INDEPENDENT_AMBULATORY_CARE_PROVIDER_SITE_OTHER): Payer: Medicare Other | Admitting: Family

## 2015-02-11 VITALS — BP 116/71 | HR 79 | Temp 98.6°F | Wt 232.0 lb

## 2015-02-11 DIAGNOSIS — O0972 Supervision of high risk pregnancy due to social problems, second trimester: Secondary | ICD-10-CM | POA: Diagnosis not present

## 2015-02-11 DIAGNOSIS — O0971 Supervision of high risk pregnancy due to social problems, first trimester: Secondary | ICD-10-CM

## 2015-02-11 DIAGNOSIS — O09891 Supervision of other high risk pregnancies, first trimester: Secondary | ICD-10-CM

## 2015-02-11 LAB — POCT URINALYSIS DIP (DEVICE)
Bilirubin Urine: NEGATIVE
GLUCOSE, UA: NEGATIVE mg/dL
Hgb urine dipstick: NEGATIVE
Ketones, ur: NEGATIVE mg/dL
Nitrite: NEGATIVE
PH: 6 (ref 5.0–8.0)
PROTEIN: NEGATIVE mg/dL
Specific Gravity, Urine: >=1.03 (ref 1.005–1.030)
Urobilinogen, UA: 0.2 mg/dL (ref 0.0–1.0)

## 2015-02-11 NOTE — Progress Notes (Signed)
Subjective:  Nichole Lopez is a 30 y.o. G3P1011 at 2657w1d being seen today for ongoing prenatal care.  She is currently monitored for the following issues for this high-risk pregnancy and has Alcohol dependence, continuous (HCC); Substance-induced disorder (HCC); Post traumatic stress disorder (PTSD); Borderline personality disorder;  Supervision of high risk pregnancy due to social problems in first trimester; Alcohol consumption during pregnancy in first trimester; Medication exposure during first trimester of pregnancy; and Drug abuse during pregnancy on her problem list.  Patient reports no complaints.  Contractions: Not present. Vag. Bleeding: None.   . Denies leaking of fluid.   The following portions of the patient's history were reviewed and updated as appropriate: allergies, current medications, past family history, past medical history, past social history, past surgical history and problem list. Problem list updated.  Objective:   Filed Vitals:   02/11/15 0928  BP: 116/71  Pulse: 79  Temp: 98.6 F (37 C)  Weight: 232 lb (105.235 kg)    Fetal Status: Fetal Heart Rate (bpm): 152 Fundal Height: 21 cm       General:  Alert, oriented and cooperative. Patient is in no acute distress.  Skin: Skin is warm and dry. No rash noted.   Cardiovascular: Normal heart rate noted  Respiratory: Normal respiratory effort, no problems with respiration noted  Abdomen: Soft, gravid, appropriate for gestational age. Pain/Pressure: Absent     Pelvic: Vag. Bleeding: None     Cervical exam deferred        Extremities: Normal range of motion.  Edema: None  Mental Status: Normal mood and affect. Normal behavior. Normal judgment and thought content.   Urinalysis: Urine Protein: Negative Urine Glucose: Negative  Assessment and Plan:  Pregnancy: G3P1011 at 6557w1d  1. Supervision of high risk pregnancy due to social problems in first trimester - Reviewed ultrasound results - Fetal echo next  week  2. Medication exposure during first trimester of pregnancy - US MFM OB FOLLOW UP; Future  Preterm labor symptoms and general obstetric precautions including but not limited to vaginal bleeding, contractions, leaking of fluid and fetal movement were reviewed in detail with the patient. Please refer to After Visit Summary for other counseling recommendations.  Return in about 4 weeks (around 03/11/2015).   Eino FarberWalidah Kennith GainN Karim, CNM

## 2015-02-19 ENCOUNTER — Encounter: Payer: Self-pay | Admitting: Family

## 2015-03-04 ENCOUNTER — Other Ambulatory Visit: Payer: Self-pay | Admitting: Family

## 2015-03-04 ENCOUNTER — Encounter (HOSPITAL_COMMUNITY): Payer: Self-pay

## 2015-03-04 ENCOUNTER — Ambulatory Visit (HOSPITAL_COMMUNITY)
Admission: RE | Admit: 2015-03-04 | Discharge: 2015-03-04 | Disposition: A | Discharge status: Home / Self Care | Payer: Medicare Other | Source: Ambulatory Visit | Attending: Family | Admitting: Family

## 2015-03-04 VITALS — BP 122/87 | HR 106 | Wt 235.4 lb

## 2015-03-04 DIAGNOSIS — O99322 Drug use complicating pregnancy, second trimester: Secondary | ICD-10-CM | POA: Diagnosis present

## 2015-03-04 DIAGNOSIS — O0971 Supervision of high risk pregnancy due to social problems, first trimester: Secondary | ICD-10-CM

## 2015-03-04 DIAGNOSIS — IMO0002 Reserved for concepts with insufficient information to code with codable children: Secondary | ICD-10-CM

## 2015-03-04 DIAGNOSIS — O09891 Supervision of other high risk pregnancies, first trimester: Secondary | ICD-10-CM

## 2015-03-04 DIAGNOSIS — O99332 Smoking (tobacco) complicating pregnancy, second trimester: Secondary | ICD-10-CM | POA: Insufficient documentation

## 2015-03-04 DIAGNOSIS — F1010 Alcohol abuse, uncomplicated: Secondary | ICD-10-CM

## 2015-03-04 DIAGNOSIS — Z36 Encounter for antenatal screening of mother: Secondary | ICD-10-CM | POA: Diagnosis not present

## 2015-03-04 DIAGNOSIS — Z0489 Encounter for examination and observation for other specified reasons: Secondary | ICD-10-CM

## 2015-03-04 DIAGNOSIS — O99312 Alcohol use complicating pregnancy, second trimester: Secondary | ICD-10-CM

## 2015-03-04 DIAGNOSIS — Z3A23 23 weeks gestation of pregnancy: Secondary | ICD-10-CM

## 2015-03-04 DIAGNOSIS — O99311 Alcohol use complicating pregnancy, first trimester: Secondary | ICD-10-CM

## 2015-03-04 DIAGNOSIS — O99320 Drug use complicating pregnancy, unspecified trimester: Secondary | ICD-10-CM

## 2015-03-04 DIAGNOSIS — F1920 Other psychoactive substance dependence, uncomplicated: Secondary | ICD-10-CM | POA: Diagnosis not present

## 2015-03-04 DIAGNOSIS — O99342 Other mental disorders complicating pregnancy, second trimester: Secondary | ICD-10-CM

## 2015-03-04 DIAGNOSIS — O99333 Smoking (tobacco) complicating pregnancy, third trimester: Secondary | ICD-10-CM

## 2015-03-04 DIAGNOSIS — F1910 Other psychoactive substance abuse, uncomplicated: Secondary | ICD-10-CM

## 2015-03-12 ENCOUNTER — Ambulatory Visit (INDEPENDENT_AMBULATORY_CARE_PROVIDER_SITE_OTHER): Payer: Medicare Other | Admitting: Family

## 2015-03-12 ENCOUNTER — Encounter: Payer: Self-pay | Admitting: Family

## 2015-03-12 VITALS — BP 119/73 | HR 77 | Temp 98.3°F | Wt 234.7 lb

## 2015-03-12 DIAGNOSIS — O0972 Supervision of high risk pregnancy due to social problems, second trimester: Secondary | ICD-10-CM

## 2015-03-12 DIAGNOSIS — O0992 Supervision of high risk pregnancy, unspecified, second trimester: Secondary | ICD-10-CM

## 2015-03-12 DIAGNOSIS — O0970 Supervision of high risk pregnancy due to social problems, unspecified trimester: Secondary | ICD-10-CM | POA: Insufficient documentation

## 2015-03-12 LAB — POCT URINALYSIS DIP (DEVICE)
BILIRUBIN URINE: NEGATIVE
Glucose, UA: NEGATIVE mg/dL
HGB URINE DIPSTICK: NEGATIVE
KETONES UR: NEGATIVE mg/dL
Nitrite: NEGATIVE
PH: 7 (ref 5.0–8.0)
PROTEIN: NEGATIVE mg/dL
SPECIFIC GRAVITY, URINE: 1.02 (ref 1.005–1.030)
Urobilinogen, UA: 1 mg/dL (ref 0.0–1.0)

## 2015-03-12 NOTE — Progress Notes (Signed)
Subjective:  Roniqua Kintz is a 30 y.o. G3P1011 at [redacted]w[redacted]d being seen today for ongoing prenatal care.  She is currently monitored for the following issues for this high-risk pregnancy and has alcohol dependence, continuous (HCC); Substance-induced disorder (HCC); Post traumatic stress disorder (PTSD);Major depressive disorder, recurrent episode, severe, without mention of psychotic behavior;Suicidal ideation; Supervision of high risk pregnancy due to social problems in first trimester; Alcohol consumption during pregnancy in first trimester; Medication exposure during first trimester of pregnancy; Drug abuse during pregnancy; and supervision of high risk pregnancy due to social problems, antepartum on her problem list.  Patient reports no complaints.  Desires to see social worker to discuss potential custody issues.  Does not have custody of other child.   Contractions: Not present. Vag. Bleeding: None.  Movement: Present. Denies leaking of fluid.   The following portions of the patient's history were reviewed and updated as appropriate: allergies, current medications, past family history, past medical history, past social history, past surgical history and problem list. Problem list updated.  Objective:   Filed Vitals:   03/12/15 0946  BP: 119/73  Pulse: 77  Temp: 98.3 F (36.8 C)  Weight: 234 lb 11.2 oz (106.459 kg)    Fetal Status: Fetal Heart Rate (bpm): 156 Fundal Height: 25 cm Movement: Present     General:  Alert, oriented and cooperative. Patient is in no acute distress.  Skin: Skin is warm and dry. No rash noted.   Cardiovascular: Normal heart rate noted  Respiratory: Normal respiratory effort, no problems with respiration noted  Abdomen: Soft, gravid, appropriate for gestational age. Pain/Pressure: Absent     Pelvic: Vag. Bleeding: None     Cervical exam deferred        Extremities: Normal range of motion.  Edema: None  Mental Status: Normal mood and affect. Normal behavior.  Normal judgment and thought content.   Urinalysis: Urine Protein: Negative Urine Glucose: Negative  Assessment and Plan:  Pregnancy: G3P1011 at [redacted]w[redacted]d  1.  Supervision of high risk pregnancy due to social problems, antepartum, second trimester - Social worker not available today; will schedule appt to discuss custody  Preterm labor symptoms and general obstetric precautions including but not limited to vaginal bleeding, contractions, leaking of fluid and fetal movement were reviewed in detail with the patient. Please refer to After Visit Summary for other counseling recommendations.  Return in about 3 weeks (around 04/02/2015).   Eino Farber Kennith Gain, CNM

## 2015-03-19 ENCOUNTER — Ambulatory Visit: Payer: Self-pay

## 2015-04-01 ENCOUNTER — Encounter: Payer: Self-pay | Admitting: *Deleted

## 2015-04-02 ENCOUNTER — Ambulatory Visit (INDEPENDENT_AMBULATORY_CARE_PROVIDER_SITE_OTHER): Payer: Medicare Other | Admitting: Family

## 2015-04-02 VITALS — BP 104/65 | HR 100 | Temp 98.5°F | Wt 242.9 lb

## 2015-04-02 DIAGNOSIS — F1920 Other psychoactive substance dependence, uncomplicated: Secondary | ICD-10-CM

## 2015-04-02 DIAGNOSIS — O99322 Drug use complicating pregnancy, second trimester: Secondary | ICD-10-CM

## 2015-04-02 LAB — POCT URINALYSIS DIP (DEVICE)
GLUCOSE, UA: NEGATIVE mg/dL
Hgb urine dipstick: NEGATIVE
Ketones, ur: NEGATIVE mg/dL
Nitrite: NEGATIVE
PH: 6 (ref 5.0–8.0)
PROTEIN: NEGATIVE mg/dL
UROBILINOGEN UA: 0.2 mg/dL (ref 0.0–1.0)

## 2015-04-02 NOTE — Progress Notes (Signed)
Pt would like to see Social Work before she leaves.

## 2015-04-02 NOTE — Progress Notes (Signed)
Subjective:  Nichole Lopez is a 30 y.o. G3P1011 at [redacted]w[redacted]d being seen today for ongoing prenatal care.  She is currently monitored for the following issues for this high-risk pregnancy and has Alcohol dependence, continuous (HCC); Substance-induced disorder (HCC); Post traumatic stress disorder (PTSD); Borderline personality disorder; Bipolar disorder, current episode manic w/o psychotic features, severe (HCC); Major depressive disorder, recurrent episode, severe, without mention of psychotic behavior; Supervision of high risk pregnancy, antepartum; Alcohol consumption during pregnancy in first trimester; Medication exposure during first trimester of pregnancy; Drug abuse during pregnancy; and Social problems, antepartum on her problem list.  Patient reports no complaints.  Contractions: Not present. Vag. Bleeding: None.  Movement: Present. Denies leaking of fluid.   The following portions of the patient's history were reviewed and updated as appropriate: allergies, current medications, past family history, past medical history, past social history, past surgical history and problem list. Problem list updated.  Objective:   Filed Vitals:   04/02/15 1053  BP: 104/65  Pulse: 100  Temp: 98.5 F (36.9 C)  Weight: 242 lb 14.4 oz (110.179 kg)    Fetal Status: Fetal Heart Rate (bpm): 155 Fundal Height: 30 cm Movement: Present     General:  Alert, oriented and cooperative. Patient is in no acute distress.  Skin: Skin is warm and dry. No rash noted.   Cardiovascular: Normal heart rate noted  Respiratory: Normal respiratory effort, no problems with respiration noted  Abdomen: Soft, gravid, appropriate for gestational age. Pain/Pressure: Absent     Pelvic: Vag. Bleeding: None     Cervical exam deferred        Extremities: Normal range of motion.  Edema: None  Mental Status: Normal mood and affect. Normal behavior. Normal judgment and thought content.   Urinalysis: Urine Protein: Negative Urine  Glucose: Negative  Assessment and Plan:  Pregnancy: G3P1011 at [redacted]w[redacted]d  1. Drug dependence of mother in pregnancy, second trimester [facility]) - Continue observation - Ultrasound scheduled  2. Supervision of High Risk Pregnancy - 1 hr not given today; pt will return for lab only visit - Appt with Social Worker today   Preterm labor symptoms and general obstetric precautions including but not limited to vaginal bleeding, contractions, leaking of fluid and fetal movement were reviewed in detail with the patient. Please refer to After Visit Summary for other counseling recommendations.  Return in about 1 week (around 04/09/2015) for lab only; 2 wks appt.   Eino Farber Kennith Gain, CNM

## 2015-04-03 ENCOUNTER — Observation Stay (HOSPITAL_COMMUNITY): Admission: AD | Admit: 2015-04-03 | Payer: Medicare Other | Source: Intra-hospital | Admitting: Psychiatry

## 2015-04-03 ENCOUNTER — Inpatient Hospital Stay (HOSPITAL_COMMUNITY)
Admission: AD | Admit: 2015-04-03 | Discharge: 2015-04-03 | Disposition: A | Discharge status: Home / Self Care | Payer: Medicare Other | Source: Ambulatory Visit | Attending: Family Medicine | Admitting: Family Medicine

## 2015-04-03 ENCOUNTER — Encounter (HOSPITAL_COMMUNITY): Payer: Self-pay

## 2015-04-03 DIAGNOSIS — O99342 Other mental disorders complicating pregnancy, second trimester: Secondary | ICD-10-CM | POA: Diagnosis not present

## 2015-04-03 DIAGNOSIS — R45851 Suicidal ideations: Secondary | ICD-10-CM | POA: Diagnosis not present

## 2015-04-03 DIAGNOSIS — O99332 Smoking (tobacco) complicating pregnancy, second trimester: Secondary | ICD-10-CM | POA: Diagnosis not present

## 2015-04-03 DIAGNOSIS — Z3A27 27 weeks gestation of pregnancy: Secondary | ICD-10-CM | POA: Diagnosis not present

## 2015-04-03 DIAGNOSIS — F17210 Nicotine dependence, cigarettes, uncomplicated: Secondary | ICD-10-CM | POA: Insufficient documentation

## 2015-04-03 DIAGNOSIS — O26892 Other specified pregnancy related conditions, second trimester: Secondary | ICD-10-CM | POA: Diagnosis present

## 2015-04-03 LAB — URINALYSIS, ROUTINE W REFLEX MICROSCOPIC
BILIRUBIN URINE: NEGATIVE
Glucose, UA: NEGATIVE mg/dL
Hgb urine dipstick: NEGATIVE
KETONES UR: NEGATIVE mg/dL
Nitrite: NEGATIVE
Protein, ur: NEGATIVE mg/dL
Specific Gravity, Urine: 1.025 (ref 1.005–1.030)
pH: 6 (ref 5.0–8.0)

## 2015-04-03 LAB — URINE MICROSCOPIC-ADD ON

## 2015-04-03 NOTE — MAU Note (Signed)
Been depressed today because worried this baby will be taken away from me. My daughter is in foster care. Depressed and had thoughts of hurting myself but not the baby. Denies Lof, bleeding or any pregnancy problems. Is on meds for depression and [facility] to [facility]. Did not call them today because I do not trust them

## 2015-04-03 NOTE — Discharge Instructions (Signed)
Suicidal Feelings: How to Help Yourself °Suicide is the taking of one's own life. If you feel as though life is getting too tough to handle and are thinking about suicide, get help right away. To get help: °· Call your local emergency services (911 in the [place] °· Call a suicide hotline to speak with a trained counselor who understands how you are feeling. The following is a list of suicide hotlines in the [place]. For a list of hotlines in [place], visit www.suicide.org/hotlines/international/[place]-suicide-hotlines.html. °¨  1-800-273-TALK (1-800-273-8255). °¨  1-800-SUICIDE (1-800-784-2433). °¨  1-888-628-9454. This is a hotline for Spanish speakers. °¨  1-800-799-4TTY (1-800-799-4889). This is a hotline for TTY users. °¨  1-866-4-U-TREVOR (1-866-488-7386). This is a hotline for lesbian, gay, bisexual, transgender, or questioning youth. °· Contact a crisis center or a local suicide prevention center. To find a crisis center or suicide prevention center: °¨ Call your local hospital, clinic, community service organization, mental health center, social service provider, or health department. Ask for assistance in connecting to a crisis center. °¨ Visit www.suicidepreventionlifeline.org/getinvolved/locator for a list of crisis centers in the [place], or visit www.suicideprevention.[place]/thinking-about-suicide/find-a-crisis-centre for a list of centers in [place]. °· Visit the following websites: °¨  National Suicide Prevention Lifeline: www.suicidepreventionlifeline.org °¨  Hopeline: www.hopeline.com °¨  American Foundation for Suicide Prevention: www.afsp.org °¨  The Trevor Project (for lesbian, gay, bisexual, transgender, or questioning youth): www.thetrevorproject.org °HOW CAN I HELP MYSELF FEEL BETTER? °· Promise yourself that you will not do anything drastic when you have suicidal feelings. Remember, there is hope. Many people have gotten through suicidal thoughts and feelings, and you will, too. You may  have gotten through them before, and this proves that you can get through them again. °· Let family, friends, teachers, or counselors know how you are feeling. Try not to isolate yourself from those who care about you. Remember, they will want to help you. Talk with someone every day, even if you do not feel sociable. Face-to-face conversation is best. °· Call a mental health professional and see one regularly. °· Visit your primary health care provider every year. °· Eat a well-balanced diet, and space your meals so you eat regularly. °· Get plenty of rest. °· Avoid alcohol and drugs, and remove them from your home. They will only make you feel worse. °· If you are thinking of taking a lot of medicine, give your medicine to someone who can give it to you one day at a time. If you are on antidepressants and are concerned you will overdose, let your health care provider know so he or she can give you safer medicines. Ask your mental health professional about the possible side effects of any medicines you are taking. °· Remove weapons, poisons, knives, and anything else that could harm you from your home. °· Try to stick to routines. Follow a schedule every day. Put self-care on your schedule. °· Make a list of realistic goals, and cross them off when you achieve them. Accomplishments give a sense of worth. °· Wait until you are feeling better before doing the things you find difficult or unpleasant. °· Exercise if you are able. You will feel better if you exercise for even a half hour each day. °· Go out in the sun or into nature. This will help you recover from depression faster. If you have a favorite place to walk, go there. °· Do the things that have always given you pleasure. Play your favorite music, read a good book, paint a picture, play your favorite instrument, or do anything   else that takes your mind off your depression if it is safe to do. °· Keep your living space well lit. °· When you are feeling well,  write yourself a letter about tips and support that you can read when you are not feeling well. °· Remember that life's difficulties can be sorted out with help. Conditions can be treated. You can work on thoughts and strategies that serve you well. °  °This information is not intended to replace advice given to you by your health care provider. Make sure you discuss any questions you have with your health care provider. °  °Document Released: 07/31/2002 Document Revised: 02/14/2014 Document Reviewed: 05/21/2013 °[employer]vier Interactive Patient Education ©2016 [employer] ° °
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
## 2015-04-03 NOTE — MAU Provider Note (Signed)
History     CSN: [id number]  Arrival date and time: 04/03/15 1913   None     Chief Complaint  Patient presents with  . Suicidal   HPI Nichole Lopez is a 30yo G3P1011 @ 27.3wks who presents for eval of having suicidal ideation today. She met with a lawyer about the possibility of keeping this baby, and upon hearing that it may not be possible, she began to have these thoughts (her 13mo old child is in foster care). She denies any physical concerns with the pregnancy. No plan for suicide/homicide/harm to baby. Her hx includes 1) bipolar 2) major depressive d/o 3) hx substance abuse 4) BPD  OB History    Gravida Para Term Preterm AB TAB SAB Ectopic Multiple Living   3 1 1  1  1  0 1      Past Medical History  Diagnosis Date  . Asthma   . Mental disorder PTSD Depression Schizophrenic disorder,  anxiety  . Depression   . Deliberate self-cutting   . PTSD (post-traumatic stress disorder)   . Bipolar 1 disorder (HCC)   . Suicidal ideation     Past Surgical History  Procedure Laterality Date  . Foot surgery      Family History  Problem Relation Age of Onset  . Diabetes Other   . Depression Maternal Uncle   . Congenital heart disease Daughter     no surgical correction required at this time    Social History  Substance Use Topics  . Smoking status: Current Every Day Smoker -- 1.00 packs/day for 10 years    Types: Cigarettes  . Smokeless tobacco: Never Used  . Alcohol Use: Yes     Comment: wine once a month    Allergies: No Known Allergies  Prescriptions prior to admission  Medication Sig Dispense Refill Last Dose  . lisdexamfetamine (VYVANSE) 50 MG capsule Take 50 mg by mouth daily. Reported on 02/11/2015   Past Week at Unknown time  . Lurasidone HCl (LATUDA) 120 MG TABS Take 1 tablet by mouth at bedtime.   Past Week at Unknown time  . Prenatal Vit-Fe Fumarate-FA (PRENATAL MULTIVITAMIN) TABS tablet Take 1 tablet by mouth daily at 12 noon.   04/03/2015 at Unknown time  .  sertraline (ZOLOFT) 25 MG tablet Take 100 mg by mouth daily.    04/02/2015 at Unknown time  . traZODone (DESYREL) 100 MG tablet Take 1 tablet (100 mg total) by mouth at bedtime. For sleep. (Patient taking differently: Take 200 mg by mouth at bedtime. For sleep.) 30 tablet 0 04/02/2015 at Unknown time  . varenicline (CHANTIX) 0.5 MG tablet Take 0.5 mg by mouth 2 (two) times daily.   Past Week at Unknown time  . clonazePAM (KLONOPIN) 0.5 MG tablet Take 0.5 mg by mouth 2 (two) times daily as needed for anxiety. Reported on 04/02/2015   More than a month at Unknown time  . Methylphenidate HCl (RITALIN PO) Take 20 mg by mouth daily. Reported on 04/02/2015   Not Taking  . nicotine (NICODERM CQ - DOSED IN MG/24 HOURS) 14 mg/24hr patch Place 14 mg onto the skin daily. Reported on 02/11/2015   Taking  . Oxcarbazepine (TRILEPTAL) 300 MG tablet Take 300 mg by mouth 2 (two) times daily. Reported on 02/11/2015   Taking  . ziprasidone (GEODON) 20 MG capsule Take 20 mg by mouth 2 (two) times daily with a meal. Reported on 04/02/2015   Not Taking    ROS Physical Exam     Blood pressure 126/71, pulse 83, temperature 98.4 F (36.9 C), resp. rate 18, height 5' 9" (1.753 m), weight 110.768 kg (244 lb 3.2 oz), last menstrual period 09/23/2014, unknown if currently breastfeeding.  Physical Exam  Constitutional: She is oriented to person, place, and time. She appears well-developed.  HENT:  Head: Normocephalic.  Neck: Normal range of motion.  Cardiovascular: Normal rate.   Respiratory: Effort normal.  GI:  EFM 135-140, 10x10 accels, occ mi variables No ctx Appropriate for GA  Musculoskeletal: Normal range of motion.  Neurological: She is alert and oriented to person, place, and time.  Skin: Skin is warm and dry.  Psychiatric:  Mood was sad; see TTS for full assessment    MAU Course  Procedures  MDM NST read Telepsych conference set up  Recommended by Ford (counselor) via consult w/ Psychiatric NP that pt be  observed x 23 hrs at Behavioral Health- pt declined. Says she is feeling better. [facility], but does not have any friends/family who can stay with her overnight. Does not meet criteria for involuntary commitment per Ford. Has outpt therapy already in place.  Assessment and Plan  IUP@27.3wks SI Strong hx psych/substance d/o  D/C home  Recommended keep reg therapy appt F/U as scheduled for next OB visit To return to MAU/BH for any concerns for well-being  ,  CNM 04/03/2015, 9:32 PM  

## 2015-04-09 ENCOUNTER — Other Ambulatory Visit: Payer: Medicare Other

## 2015-04-09 DIAGNOSIS — O24912 Unspecified diabetes mellitus in pregnancy, second trimester: Secondary | ICD-10-CM

## 2015-04-10 ENCOUNTER — Encounter (HOSPITAL_COMMUNITY): Payer: Self-pay

## 2015-04-10 ENCOUNTER — Inpatient Hospital Stay (HOSPITAL_COMMUNITY)
Admission: AD | Admit: 2015-04-10 | Discharge: 2015-04-10 | Disposition: A | Discharge status: Home / Self Care | Payer: Medicare Other | Source: Ambulatory Visit | Attending: Obstetrics & Gynecology | Admitting: Obstetrics & Gynecology

## 2015-04-10 DIAGNOSIS — O99343 Other mental disorders complicating pregnancy, third trimester: Secondary | ICD-10-CM | POA: Insufficient documentation

## 2015-04-10 DIAGNOSIS — R109 Unspecified abdominal pain: Secondary | ICD-10-CM | POA: Insufficient documentation

## 2015-04-10 DIAGNOSIS — O26899 Other specified pregnancy related conditions, unspecified trimester: Secondary | ICD-10-CM

## 2015-04-10 DIAGNOSIS — F319 Bipolar disorder, unspecified: Secondary | ICD-10-CM | POA: Diagnosis not present

## 2015-04-10 DIAGNOSIS — F4310 Post-traumatic stress disorder, unspecified: Secondary | ICD-10-CM | POA: Insufficient documentation

## 2015-04-10 DIAGNOSIS — R45851 Suicidal ideations: Secondary | ICD-10-CM | POA: Insufficient documentation

## 2015-04-10 DIAGNOSIS — Z3A28 28 weeks gestation of pregnancy: Secondary | ICD-10-CM | POA: Insufficient documentation

## 2015-04-10 DIAGNOSIS — O26893 Other specified pregnancy related conditions, third trimester: Secondary | ICD-10-CM | POA: Insufficient documentation

## 2015-04-10 DIAGNOSIS — J45909 Unspecified asthma, uncomplicated: Secondary | ICD-10-CM | POA: Diagnosis not present

## 2015-04-10 DIAGNOSIS — O99333 Smoking (tobacco) complicating pregnancy, third trimester: Secondary | ICD-10-CM | POA: Insufficient documentation

## 2015-04-10 DIAGNOSIS — F329 Major depressive disorder, single episode, unspecified: Secondary | ICD-10-CM | POA: Insufficient documentation

## 2015-04-10 DIAGNOSIS — O9989 Other specified diseases and conditions complicating pregnancy, childbirth and the puerperium: Secondary | ICD-10-CM

## 2015-04-10 LAB — CBC WITH DIFFERENTIAL/PLATELET
Basophils Absolute: 0 10*3/uL (ref 0.0–0.1)
Basophils Relative: 0 %
EOS PCT: 0 %
Eosinophils Absolute: 0 10*3/uL (ref 0.0–0.7)
HCT: 34.9 % — ABNORMAL LOW (ref 36.0–46.0)
HEMOGLOBIN: 12 g/dL (ref 12.0–15.0)
LYMPHS ABS: 1.8 10*3/uL (ref 0.7–4.0)
LYMPHS PCT: 17 %
MCH: 30.4 pg (ref 26.0–34.0)
MCHC: 34.4 g/dL (ref 30.0–36.0)
MCV: 88.4 fL (ref 78.0–100.0)
MONOS PCT: 3 %
Monocytes Absolute: 0.3 10*3/uL (ref 0.1–1.0)
NEUTROS PCT: 80 %
Neutro Abs: 8.2 10*3/uL — ABNORMAL HIGH (ref 1.7–7.7)
Platelets: 221 10*3/uL (ref 150–400)
RBC: 3.95 MIL/uL (ref 3.87–5.11)
RDW: 12.9 % (ref 11.5–15.5)
WBC: 10.4 10*3/uL (ref 4.0–10.5)

## 2015-04-10 LAB — COMPREHENSIVE METABOLIC PANEL
ALK PHOS: 59 U/L (ref 38–126)
ALT: 9 U/L — AB (ref 14–54)
AST: 18 U/L (ref 15–41)
Albumin: 3.5 g/dL (ref 3.5–5.0)
Anion gap: 9 (ref 5–15)
BILIRUBIN TOTAL: 0.4 mg/dL (ref 0.3–1.2)
BUN: 7 mg/dL (ref 6–20)
CALCIUM: 9 mg/dL (ref 8.9–10.3)
CO2: 19 mmol/L — ABNORMAL LOW (ref 22–32)
CREATININE: 0.67 mg/dL (ref 0.44–1.00)
Chloride: 107 mmol/L (ref 101–111)
Glucose, Bld: 122 mg/dL — ABNORMAL HIGH (ref 65–99)
Potassium: 3.5 mmol/L (ref 3.5–5.1)
Sodium: 135 mmol/L (ref 135–145)
TOTAL PROTEIN: 6.9 g/dL (ref 6.5–8.1)

## 2015-04-10 LAB — URINE MICROSCOPIC-ADD ON

## 2015-04-10 LAB — URINALYSIS, ROUTINE W REFLEX MICROSCOPIC
Bilirubin Urine: NEGATIVE
GLUCOSE, UA: NEGATIVE mg/dL
Hgb urine dipstick: NEGATIVE
KETONES UR: 15 mg/dL — AB
NITRITE: NEGATIVE
PH: 6 (ref 5.0–8.0)
Protein, ur: NEGATIVE mg/dL
Specific Gravity, Urine: >1.03 — ABNORMAL HIGH (ref 1.005–1.030)

## 2015-04-10 LAB — WET PREP, GENITAL
Clue Cells Wet Prep HPF POC: NONE SEEN
SPERM: NONE SEEN
Trich, Wet Prep: NONE SEEN
YEAST WET PREP: NONE SEEN

## 2015-04-10 LAB — LIPASE, BLOOD: LIPASE: 19 U/L (ref 11–51)

## 2015-04-10 NOTE — Progress Notes (Signed)
All monitors and loose equipment removed from patient's room and [place], NT sitting with patient on suicide precautions.  Dr. Ashok PallWouk aware.  Reactive NST and no contractions noted.  Pt no longer c/o pain at this time, but is requesting help to get her to behavioral health and be placed back on her medications because "I just don't feel like myself."

## 2015-04-10 NOTE — Progress Notes (Signed)
Patient permitted to go outside of the hospital and smoke a cigarette per Dr. Emelda FearFerguson.  She was instructed to be gone no more than 5 minutes and then come back for teleconsult.  Patient agreed.

## 2015-04-10 NOTE — Progress Notes (Signed)
Patient returned to room 4 from cigarette break.  Ready for teleconsult.
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
## 2015-04-10 NOTE — Progress Notes (Signed)
Contacted the TTS to ensure patient is on their list for teleassessment.  Confirmed she is and told we will receive a call 30 min prior to the interview and to have the computer at the patient's bedside.  Sitter continues at bedside.

## 2015-04-10 NOTE — MAU Note (Addendum)
Diffuse abdominal pain and tightening since last night, light headedness and tiredness, no vaginal bleeding. Patient expressing hopelessness and suicidal ideation, does not have a plan, has been off two of her medications wants to be back on them, [facility].

## 2015-04-10 NOTE — MAU Provider Note (Signed)
MAU HISTORY AND PHYSICAL  Chief Complaint:  Stomach pain  Nichole Lopez is a 30 y.o.  G3P1011 with IUP at [redacted]w[redacted]d presenting for stomach pain.  Symptoms earlier today. Now resolved. Coming and going pain. Whole abdomen. Not painful. No leakage of fluid or bleeding. Positive fetal movements. Normal diet. NO vomiting, no diarrhea.  Nothing in vagina last 24 hours.   Has psych provider. Off all psych meds save for trazodone and zoloft for insurance/financial problems.  Suicidal thoughts yesterday. Feeling depressed, hopeless. No plan.    Past Medical History  Diagnosis Date  . Asthma   . Mental disorder PTSD Depression Schizophrenic disorder,  anxiety  . Depression   . Deliberate self-cutting   . PTSD (post-traumatic stress disorder)   . Bipolar 1 disorder (HCC)   . Suicidal ideation     Past Surgical History  Procedure Laterality Date  . Foot surgery      Family History  Problem Relation Age of Onset  . Diabetes Other   . Depression Maternal Uncle   . Congenital heart disease Daughter     no surgical correction required at this time    Social History  Substance Use Topics  . Smoking status: Current Every Day Smoker -- 1.00 packs/day for 10 years    Types: Cigarettes  . Smokeless tobacco: Never Used  . Alcohol Use: Yes     Comment: wine once a month    No Known Allergies  Prescriptions prior to admission  Medication Sig Dispense Refill Last Dose  . lisdexamfetamine (VYVANSE) 50 MG capsule Take 50 mg by mouth daily. Reported on 02/11/2015   2 weeks ago  . Lurasidone HCl (LATUDA) 120 MG TABS Take 1 tablet by mouth at bedtime.   2 weeks ago  . Prenatal Vit-Fe Fumarate-FA (PRENATAL MULTIVITAMIN) TABS tablet Take 1 tablet by mouth daily at 12 noon.   04/09/2015 at Unknown time  . sertraline (ZOLOFT) 100 MG tablet Take 100 mg by mouth daily.   Past Week at Unknown time  . traZODone (DESYREL) 100 MG tablet Take 100 mg by mouth at bedtime.   04/09/2015 at Unknown time  .  varenicline (CHANTIX) 0.5 MG tablet Take 0.5 mg by mouth 2 (two) times daily.   Past Week at Unknown time    Review of Systems - Negative except for what is mentioned in HPI.  Physical Exam  Blood pressure 116/76, pulse 99, temperature 97.8 F (36.6 C), temperature source Oral, resp. rate 17, last menstrual period 09/23/2014, SpO2 98 %, unknown if currently breastfeeding. GENERAL: Well-developed, well-nourished female in no acute distress.  LUNGS: Clear to auscultation bilaterally.  HEART: Regular rate and rhythm. ABDOMEN: Soft, nontender, nondistended, gravid.  EXTREMITIES: Nontender, no edema, 2+ distal pulses. Cervical Exam: closed/thick/high FHT: 145/mod/+a/-d   Labs: Results for orders placed or performed during the hospital encounter of 04/10/15 (from the past 24 hour(s))  Urinalysis, Routine w reflex microscopic (not at [facility])   Collection Time: 04/10/15  1:30 PM  Result Value Ref Range   Color, Urine YELLOW YELLOW   APPearance CLEAR CLEAR   Specific Gravity, Urine >1.030 (H) 1.005 - 1.030   pH 6.0 5.0 - 8.0   Glucose, UA NEGATIVE NEGATIVE mg/dL   Hgb urine dipstick NEGATIVE NEGATIVE   Bilirubin Urine NEGATIVE NEGATIVE   Ketones, ur 15 (A) NEGATIVE mg/dL   Protein, ur NEGATIVE NEGATIVE mg/dL   Nitrite NEGATIVE NEGATIVE   Leukocytes, UA TRACE (A) NEGATIVE  Urine microscopic-add on   Collection Time: 04/10/15  1:30 PM  Result Value Ref Range   Squamous Epithelial / LPF 0-5 (A) NONE SEEN   WBC, UA 6-30 0 - 5 WBC/hpf   RBC / HPF 0-5 0 - 5 RBC/hpf   Bacteria, UA FEW (A) NONE SEEN   Urine-Other MUCOUS PRESENT   Wet prep, genital   Collection Time: 04/10/15  2:25 PM  Result Value Ref Range   Yeast Wet Prep HPF POC NONE SEEN NONE SEEN   Trich, Wet Prep NONE SEEN NONE SEEN   Clue Cells Wet Prep HPF POC NONE SEEN NONE SEEN   WBC, Wet Prep HPF POC MANY (A) NONE SEEN   Sperm NONE SEEN   CBC with Differential/Platelet   Collection Time: 04/10/15  2:58 PM  Result Value Ref  Range   WBC 10.4 4.0 - 10.5 K/uL   RBC 3.95 3.87 - 5.11 MIL/uL   Hemoglobin 12.0 12.0 - 15.0 g/dL   HCT 78.234.9 (L) 95.636.0 - 21.346.0 %   MCV 88.4 78.0 - 100.0 fL   MCH 30.4 26.0 - 34.0 pg   MCHC 34.4 30.0 - 36.0 g/dL   RDW 08.612.9 57.811.5 - 46.915.5 %   Platelets 221 150 - 400 K/uL   Neutrophils Relative % 80 %   Neutro Abs 8.2 (H) 1.7 - 7.7 K/uL   Lymphocytes Relative 17 %   Lymphs Abs 1.8 0.7 - 4.0 K/uL   Monocytes Relative 3 %   Monocytes Absolute 0.3 0.1 - 1.0 K/uL   Eosinophils Relative 0 %   Eosinophils Absolute 0.0 0.0 - 0.7 K/uL   Basophils Relative 0 %   Basophils Absolute 0.0 0.0 - 0.1 K/uL  Comprehensive metabolic panel   Collection Time: 04/10/15  2:58 PM  Result Value Ref Range   Sodium 135 135 - 145 mmol/L   Potassium 3.5 3.5 - 5.1 mmol/L   Chloride 107 101 - 111 mmol/L   CO2 19 (L) 22 - 32 mmol/L   Glucose, Bld 122 (H) 65 - 99 mg/dL   BUN 7 6 - 20 mg/dL   Creatinine, Ser 6.290.67 0.44 - 1.00 mg/dL   Calcium 9.0 8.9 - 52.810.3 mg/dL   Total Protein 6.9 6.5 - 8.1 g/dL   Albumin 3.5 3.5 - 5.0 g/dL   AST 18 15 - 41 U/L   ALT 9 (L) 14 - 54 U/L   Alkaline Phosphatase 59 38 - 126 U/L   Total Bilirubin 0.4 0.3 - 1.2 mg/dL   GFR calc non Af Amer >60 >60 mL/min   GFR calc Af Amer >60 >60 mL/min   Anion gap 9 5 - 15  Lipase, blood   Collection Time: 04/10/15  2:58 PM  Result Value Ref Range   Lipase 19 11 - 51 U/L    Imaging Studies:  No results found.  Assessment/Plan: Nichole Lopez is  30 y.o. G3P1011 at 2754w3d presents with abdominal pain and suicidal ideation.  #Abdominal pain - mild, not currently present, normal exam, cmp/cbc/ua/wet unremarkable, not laboring with closed cervix. Etiology unclear but do not think serious. NST reactive for gestational age. - abdominal pain return precautions  # Suicidal ideation - behavioral health consulted. Evaluated, judges patient to not be immediate harm to self and safe for discharge with psych f/u, which we have strongly  encouraged.   Nichole Lopez 3/3/20175:40 PM

## 2015-04-13 LAB — GC/CHLAMYDIA PROBE AMP (~~LOC~~) NOT AT ARMC
Chlamydia: NEGATIVE
Neisseria Gonorrhea: NEGATIVE

## 2015-04-15 ENCOUNTER — Ambulatory Visit (HOSPITAL_COMMUNITY): Payer: Medicare Other

## 2015-04-15 ENCOUNTER — Other Ambulatory Visit: Payer: Self-pay

## 2015-04-15 ENCOUNTER — Ambulatory Visit (HOSPITAL_COMMUNITY): Payer: Medicare Other | Attending: Maternal and Fetal Medicine

## 2015-04-23 ENCOUNTER — Ambulatory Visit (INDEPENDENT_AMBULATORY_CARE_PROVIDER_SITE_OTHER): Payer: Medicare Other | Admitting: Obstetrics & Gynecology

## 2015-04-23 VITALS — BP 115/78 | HR 105 | Temp 98.4°F | Wt 248.0 lb

## 2015-04-23 DIAGNOSIS — F319 Bipolar disorder, unspecified: Secondary | ICD-10-CM

## 2015-04-23 DIAGNOSIS — O99343 Other mental disorders complicating pregnancy, third trimester: Secondary | ICD-10-CM

## 2015-04-23 DIAGNOSIS — O99323 Drug use complicating pregnancy, third trimester: Secondary | ICD-10-CM

## 2015-04-23 DIAGNOSIS — O0993 Supervision of high risk pregnancy, unspecified, third trimester: Secondary | ICD-10-CM

## 2015-04-23 DIAGNOSIS — F1590 Other stimulant use, unspecified, uncomplicated: Secondary | ICD-10-CM

## 2015-04-23 LAB — CBC
HEMATOCRIT: 35.5 % — AB (ref 36.0–46.0)
HEMOGLOBIN: 11.9 g/dL — AB (ref 12.0–15.0)
MCH: 29.7 pg (ref 26.0–34.0)
MCHC: 33.5 g/dL (ref 30.0–36.0)
MCV: 88.5 fL (ref 78.0–100.0)
MPV: 12 fL (ref 8.6–12.4)
Platelets: 222 10*3/uL (ref 150–400)
RBC: 4.01 MIL/uL (ref 3.87–5.11)
RDW: 13.1 % (ref 11.5–15.5)
WBC: 9.7 10*3/uL (ref 4.0–10.5)

## 2015-04-23 LAB — POCT URINALYSIS DIP (DEVICE)
Glucose, UA: NEGATIVE mg/dL
Nitrite: NEGATIVE
PROTEIN: 30 mg/dL — AB
Urobilinogen, UA: 0.2 mg/dL (ref 0.0–1.0)
pH: 6 (ref 5.0–8.0)

## 2015-04-23 MED ORDER — TETANUS-DIPHTH-ACELL PERTUSSIS 5-2.5-18.5 LF-MCG/0.5 IM SUSP: 0.5000 mL | Freq: Once | INTRAMUSCULAR | Status: DC

## 2015-04-23 NOTE — Progress Notes (Signed)
Subjective:missed US appt  Nichole Lopez is a 30 y.o. G3P1011 at 5469w2d being seen today for ongoing prenatal care.  She is currently monitored for the following issues for this high-risk pregnancy and has Alcohol dependence, continuous (HCC); Substance-induced disorder (HCC); Post traumatic stress disorder (PTSD); Borderline personality disorder; Bipolar disorder, current episode manic w/o psychotic features, severe (HCC); Major depressive disorder, recurrent episode, severe, without mention of psychotic behavior; Supervision of high risk pregnancy, antepartum; Alcohol consumption during pregnancy in first trimester; Medication exposure during first trimester of pregnancy; Drug abuse during pregnancy; and Social problems, antepartum on her problem list.  Patient reports occasional contractions.  Contractions: Irritability. Vag. Bleeding: None.  Movement: Present. Denies leaking of fluid.   The following portions of the patient's history were reviewed and updated as appropriate: allergies, current medications, past family history, past medical history, past social history, past surgical history and problem list. Problem list updated.  Objective:   Filed Vitals:   04/23/15 0955  BP: 115/78  Pulse: 105  Temp: 98.4 F (36.9 C)  Weight: 248 lb (112.492 kg)    Fetal Status: Fetal Heart Rate (bpm): 169 Fundal Height: 32 cm Movement: Present     General:  Alert, oriented and cooperative. Patient is in no acute distress.  Skin: Skin is warm and dry. No rash noted.   Cardiovascular: Normal heart rate noted  Respiratory: Normal respiratory effort, no problems with respiration noted  Abdomen: Soft, gravid, appropriate for gestational age. Pain/Pressure: Present     Pelvic: Vag. Bleeding: None     Cervical exam deferred        Extremities: Normal range of motion.  Edema: None  Mental Status: Normal mood and affect. Normal behavior. Normal judgment and thought content.   Urinalysis:       Assessment and Plan:  Pregnancy: G3P1011 at 5269w2d  1. Supervision of high risk pregnancy, antepartum, third trimester R/s US - Tdap (BOOSTRIX) injection 0.5 mL; Inject 0.5 mLs into the muscle once. - Glucose Tolerance, 1 HR (50g) w/o Fasting - RPR - CBC; Standing - CBC - HIV antibody (with reflex) Wants to talk to SW today Preterm labor symptoms and general obstetric precautions including but not limited to vaginal bleeding, contractions, leaking of fluid and fetal movement were reviewed in detail with the patient. Please refer to After Visit Summary for other counseling recommendations.  Return in about 2 weeks (around 05/07/2015).   Adam PhenixJames G Rylend Pietrzak, MD

## 2015-04-23 NOTE — Progress Notes (Signed)
Breastfeeding tip of the week reviewed 1hr gtt, 28 week labs today Tdap today

## 2015-04-23 NOTE — Patient Instructions (Signed)

## 2015-04-24 LAB — HIV ANTIBODY (ROUTINE TESTING W REFLEX): HIV 1&2 Ab, 4th Generation: NONREACTIVE

## 2015-04-24 LAB — GLUCOSE TOLERANCE, 1 HOUR (50G) W/O FASTING: Glucose, 1 Hr, gestational: 75 mg/dL (ref <140)

## 2015-04-25 LAB — RPR

## 2015-04-28 ENCOUNTER — Ambulatory Visit (HOSPITAL_COMMUNITY): Payer: Medicare Other

## 2015-04-30 ENCOUNTER — Encounter (HOSPITAL_COMMUNITY): Payer: Self-pay

## 2015-04-30 ENCOUNTER — Ambulatory Visit (HOSPITAL_COMMUNITY)
Admission: RE | Admit: 2015-04-30 | Discharge: 2015-04-30 | Disposition: A | Discharge status: Home / Self Care | Payer: Medicare Other | Source: Ambulatory Visit | Attending: Maternal and Fetal Medicine | Admitting: Maternal and Fetal Medicine

## 2015-04-30 VITALS — BP 119/75 | HR 84 | Wt 248.0 lb

## 2015-04-30 DIAGNOSIS — O99343 Other mental disorders complicating pregnancy, third trimester: Secondary | ICD-10-CM | POA: Diagnosis not present

## 2015-04-30 DIAGNOSIS — O99320 Drug use complicating pregnancy, unspecified trimester: Secondary | ICD-10-CM

## 2015-04-30 DIAGNOSIS — Z3A31 31 weeks gestation of pregnancy: Secondary | ICD-10-CM | POA: Diagnosis not present

## 2015-04-30 DIAGNOSIS — O99333 Smoking (tobacco) complicating pregnancy, third trimester: Secondary | ICD-10-CM | POA: Diagnosis not present

## 2015-04-30 DIAGNOSIS — O0972 Supervision of high risk pregnancy due to social problems, second trimester: Secondary | ICD-10-CM

## 2015-04-30 DIAGNOSIS — F1910 Other psychoactive substance abuse, uncomplicated: Secondary | ICD-10-CM

## 2015-04-30 DIAGNOSIS — F1920 Other psychoactive substance dependence, uncomplicated: Secondary | ICD-10-CM | POA: Insufficient documentation

## 2015-04-30 DIAGNOSIS — O09891 Supervision of other high risk pregnancies, first trimester: Secondary | ICD-10-CM

## 2015-04-30 DIAGNOSIS — O99323 Drug use complicating pregnancy, third trimester: Secondary | ICD-10-CM | POA: Diagnosis not present

## 2015-04-30 DIAGNOSIS — O0993 Supervision of high risk pregnancy, unspecified, third trimester: Secondary | ICD-10-CM

## 2015-04-30 DIAGNOSIS — O99311 Alcohol use complicating pregnancy, first trimester: Secondary | ICD-10-CM

## 2015-04-30 DIAGNOSIS — O0971 Supervision of high risk pregnancy due to social problems, first trimester: Secondary | ICD-10-CM

## 2015-05-01 ENCOUNTER — Encounter (HOSPITAL_COMMUNITY): Payer: Self-pay | Admitting: *Deleted

## 2015-05-01 ENCOUNTER — Encounter: Payer: Self-pay | Admitting: *Deleted

## 2015-05-01 ENCOUNTER — Inpatient Hospital Stay (HOSPITAL_COMMUNITY)
Admission: AD | Admit: 2015-05-01 | Discharge: 2015-05-01 | Disposition: A | Discharge status: Home / Self Care | Payer: Medicare Other | Source: Ambulatory Visit | Attending: Obstetrics & Gynecology | Admitting: Obstetrics & Gynecology

## 2015-05-01 DIAGNOSIS — O98313 Other infections with a predominantly sexual mode of transmission complicating pregnancy, third trimester: Secondary | ICD-10-CM | POA: Insufficient documentation

## 2015-05-01 DIAGNOSIS — O99343 Other mental disorders complicating pregnancy, third trimester: Secondary | ICD-10-CM | POA: Insufficient documentation

## 2015-05-01 DIAGNOSIS — J45909 Unspecified asthma, uncomplicated: Secondary | ICD-10-CM | POA: Diagnosis not present

## 2015-05-01 DIAGNOSIS — O09891 Supervision of other high risk pregnancies, first trimester: Secondary | ICD-10-CM

## 2015-05-01 DIAGNOSIS — F1910 Other psychoactive substance abuse, uncomplicated: Secondary | ICD-10-CM

## 2015-05-01 DIAGNOSIS — Z3A31 31 weeks gestation of pregnancy: Secondary | ICD-10-CM | POA: Diagnosis not present

## 2015-05-01 DIAGNOSIS — O99333 Smoking (tobacco) complicating pregnancy, third trimester: Secondary | ICD-10-CM | POA: Insufficient documentation

## 2015-05-01 DIAGNOSIS — F17210 Nicotine dependence, cigarettes, uncomplicated: Secondary | ICD-10-CM | POA: Insufficient documentation

## 2015-05-01 DIAGNOSIS — O99320 Drug use complicating pregnancy, unspecified trimester: Secondary | ICD-10-CM

## 2015-05-01 DIAGNOSIS — F329 Major depressive disorder, single episode, unspecified: Secondary | ICD-10-CM | POA: Diagnosis not present

## 2015-05-01 DIAGNOSIS — O99311 Alcohol use complicating pregnancy, first trimester: Secondary | ICD-10-CM

## 2015-05-01 DIAGNOSIS — A5901 Trichomonal vulvovaginitis: Secondary | ICD-10-CM | POA: Diagnosis not present

## 2015-05-01 DIAGNOSIS — O99513 Diseases of the respiratory system complicating pregnancy, third trimester: Secondary | ICD-10-CM | POA: Diagnosis not present

## 2015-05-01 DIAGNOSIS — O0972 Supervision of high risk pregnancy due to social problems, second trimester: Secondary | ICD-10-CM

## 2015-05-01 DIAGNOSIS — O98311 Other infections with a predominantly sexual mode of transmission complicating pregnancy, first trimester: Secondary | ICD-10-CM

## 2015-05-01 DIAGNOSIS — O26893 Other specified pregnancy related conditions, third trimester: Secondary | ICD-10-CM | POA: Diagnosis present

## 2015-05-01 LAB — URINALYSIS, ROUTINE W REFLEX MICROSCOPIC
BILIRUBIN URINE: NEGATIVE
GLUCOSE, UA: NEGATIVE mg/dL
HGB URINE DIPSTICK: NEGATIVE
KETONES UR: NEGATIVE mg/dL
Nitrite: NEGATIVE
PROTEIN: NEGATIVE mg/dL
Specific Gravity, Urine: 1.02 (ref 1.005–1.030)
pH: 7 (ref 5.0–8.0)

## 2015-05-01 LAB — URINE MICROSCOPIC-ADD ON

## 2015-05-01 LAB — WET PREP, GENITAL
Clue Cells Wet Prep HPF POC: NONE SEEN
Sperm: NONE SEEN
Yeast Wet Prep HPF POC: NONE SEEN

## 2015-05-01 MED ORDER — METRONIDAZOLE 500 MG PO TABS
2000.0000 mg | ORAL_TABLET | Freq: Once | ORAL | Status: AC
  Administered 2015-05-01: 2000 mg via ORAL
  Filled 2015-05-01: qty 4

## 2015-05-01 NOTE — Discharge Instructions (Signed)
Trichomoniasis Trichomoniasis is an infection caused by an organism called Trichomonas. The infection can affect both women and men. In women, the outer female genitalia and the vagina are affected. In men, the penis is mainly affected, but the prostate and other reproductive organs can also be involved. Trichomoniasis is a sexually transmitted infection (STI) and is most often passed to another person through sexual contact.  RISK FACTORS  Having unprotected sexual intercourse.  Having sexual intercourse with an infected partner. SIGNS AND SYMPTOMS  Symptoms of trichomoniasis in women include:  Abnormal gray-green frothy vaginal discharge.  Itching and irritation of the vagina.  Itching and irritation of the area outside the vagina. Symptoms of trichomoniasis in men include:   Penile discharge with or without pain.  Pain during urination. This results from inflammation of the urethra. DIAGNOSIS  Trichomoniasis may be found during a Pap test or physical exam. Your health care provider may use one of the following methods to help diagnose this infection:  Testing the pH of the vagina with a test tape.  Using a vaginal swab test that checks for the Trichomonas organism. A test is available that provides results within a few minutes.  Examining a urine sample.  Testing vaginal secretions. Your health care provider may test you for other STIs, including HIV. TREATMENT   You may be given medicine to fight the infection. Women should inform their health care provider if they could be or are pregnant. Some medicines used to treat the infection should not be taken during pregnancy.  Your health care provider may recommend over-the-counter medicines or creams to decrease itching or irritation.  Your sexual partner will need to be treated if infected.  Your health care provider may test you for infection again 3 months after treatment. HOME CARE INSTRUCTIONS   Take medicines only as  directed by your health care provider.  Take over-the-counter medicine for itching or irritation as directed by your health care provider.  Do not have sexual intercourse while you have the infection.  Women should not douche or wear tampons while they have the infection.  Discuss your infection with your partner. Your partner may have gotten the infection from you, or you may have gotten it from your partner.  Have your sex partner get examined and treated if necessary.  Practice safe, informed, and protected sex.  See your health care provider for other STI testing. SEEK MEDICAL CARE IF:   You still have symptoms after you finish your medicine.  You develop abdominal pain.  You have pain when you urinate.  You have bleeding after sexual intercourse.  You develop a rash.  Your medicine makes you sick or makes you throw up (vomit). MAKE SURE YOU:  Understand these instructions.  Will watch your condition.  Will get help right away if you are not doing well or get worse.   This information is not intended to replace advice given to you by your health care provider. Make sure you discuss any questions you have with your health care provider.   Document Released: 07/20/2000 Document Revised: 02/14/2014 Document Reviewed: 11/05/2012 Elsevier Interactive Patient Education 2016 [employer]  

## 2015-05-01 NOTE — MAU Provider Note (Signed)
History     CSN: [id number]  Arrival date and time: 05/01/15 1803   First Provider Initiated Contact with Patient 05/01/15 1838      Chief Complaint  Patient presents with  . Loss of Mucus Plug   . Abdominal Pain   HPI Nichole Lopez 30 y.o. G3P1011 30 y.o. @ 3437w3d presents stating that she lost her mucus plug earlier today. She reports it as being green and slimey. She denies vaginal bleeding, contractions, LOF; reports positive fetal movement   Past Medical History  Diagnosis Date  . Asthma   . Mental disorder PTSD Depression Schizophrenic disorder,  anxiety  . Depression   . Deliberate self-cutting   . PTSD (post-traumatic stress disorder)   . Bipolar 1 disorder (HCC)   . Suicidal ideation     Past Surgical History  Procedure Laterality Date  . Foot surgery      Family History  Problem Relation Age of Onset  . Diabetes Other   . Depression Maternal Uncle   . Congenital heart disease Daughter     no surgical correction required at this time    Social History  Substance Use Topics  . Smoking status: Current Every Day Smoker -- 1.00 packs/day for 10 years    Types: Cigarettes  . Smokeless tobacco: Never Used  . Alcohol Use: Yes     Comment: wine once a month    Allergies: No Known Allergies  Facility-administered medications prior to admission  Medication Dose Route Frequency Provider Last Rate Last Dose  . Tdap (BOOSTRIX) injection 0.5 mL  0.5 mL Intramuscular Once Adam PhenixJames G Arnold, MD       Prescriptions prior to admission  Medication Sig Dispense Refill Last Dose  . caffeine 200 MG TABS tablet Take 400 mg by mouth every 6 (six) hours as needed (For fatigue.).   05/01/2015 at Unknown time  . lisdexamfetamine (VYVANSE) 50 MG capsule Take 50 mg by mouth daily. Reported on 04/23/2015   Past Month at Unknown time  . Lurasidone HCl (LATUDA) 120 MG TABS Take 1 tablet by mouth at bedtime. Reported on 04/23/2015   Past Month at Unknown time  . miconazole  (MONISTAT 7) 2 % vaginal cream Place 1 Applicatorful vaginally daily.   05/01/2015 at Unknown time  . Prenatal Vit-Fe Fumarate-FA (PRENATAL MULTIVITAMIN) TABS tablet Take 1 tablet by mouth daily at 12 noon.   05/01/2015 at Unknown time  . sertraline (ZOLOFT) 100 MG tablet Take 100 mg by mouth at bedtime.    04/30/2015 at Unknown time  . traZODone (DESYREL) 100 MG tablet Take 100 mg by mouth at bedtime.   04/30/2015 at Unknown time  . varenicline (CHANTIX) 0.5 MG tablet Take 0.5 mg by mouth 2 (two) times daily.   Past Month at Unknown time    Review of Systems  Constitutional: Negative for fever.  Genitourinary:       Green vaginal discharge  All other systems reviewed and are negative.  Physical Exam   Blood pressure 116/76, pulse 93, temperature 98.4 F (36.9 C), temperature source Oral, resp. rate 16, height 5\' 9"  (1.753 m), weight 247 lb (112.038 kg), last menstrual period 09/23/2014, unknown if currently breastfeeding.  Physical Exam  Nursing note and vitals reviewed. Constitutional: She is oriented to person, place, and time. She appears well-developed and well-nourished.  Cardiovascular: Normal rate and regular rhythm.   Respiratory: Effort normal and breath sounds normal. No respiratory distress.  GI: Soft. Bowel sounds are normal. There is no tenderness.  Genitourinary: There is erythema in the vagina. Vaginal discharge found.  White clumpy  Neurological: She is alert and oriented to person, place, and time.  Skin: Skin is warm and dry.  Psychiatric: She has a normal mood and affect. Her behavior is normal. Judgment and thought content normal.   Dilation: Fingertip Effacement (%): Thick Exam by:: Illene Bolus Results for orders placed or performed during the hospital encounter of 05/01/15 (from the past 24 hour(s))  Urinalysis, Routine w reflex microscopic (not at [facility])     Status: Abnormal   Collection Time: 05/01/15  6:10 PM  Result Value Ref Range   Color, Urine YELLOW  YELLOW   APPearance CLEAR CLEAR   Specific Gravity, Urine 1.020 1.005 - 1.030   pH 7.0 5.0 - 8.0   Glucose, UA NEGATIVE NEGATIVE mg/dL   Hgb urine dipstick NEGATIVE NEGATIVE   Bilirubin Urine NEGATIVE NEGATIVE   Ketones, ur NEGATIVE NEGATIVE mg/dL   Protein, ur NEGATIVE NEGATIVE mg/dL   Nitrite NEGATIVE NEGATIVE   Leukocytes, UA MODERATE (A) NEGATIVE  Urine microscopic-add on     Status: Abnormal   Collection Time: 05/01/15  6:10 PM  Result Value Ref Range   Squamous Epithelial / LPF 0-5 (A) NONE SEEN   WBC, UA 0-5 0 - 5 WBC/hpf   RBC / HPF 0-5 0 - 5 RBC/hpf   Bacteria, UA MANY (A) NONE SEEN  Wet prep, genital     Status: Abnormal   Collection Time: 05/01/15  6:50 PM  Result Value Ref Range   Yeast Wet Prep HPF POC NONE SEEN NONE SEEN   Trich, Wet Prep PRESENT (A) NONE SEEN   Clue Cells Wet Prep HPF POC NONE SEEN NONE SEEN   WBC, Wet Prep HPF POC MODERATE (A) NONE SEEN   Sperm NONE SEEN    MAU Course  Procedures  MDM Wet Prep shows Trichomoniasis; will treat with 2 gms flagyl here. Pt will need to tell partner and have them be appropriately treated. Refrain from sex for 10 days. FHR reactive ; category 1; Occasional uc's . Pt is not feeling them  Assessment and Plan   Trichomoniasis Discharge     Nichole Lopez 05/01/2015, 6:45 PM

## 2015-05-01 NOTE — MAU Note (Signed)
Patient presents at 2631 weeks gestation with c/o abdominal pain since 1400 and losing her mucus plug at 1430 today. Fetus active. Denies bleeding.

## 2015-05-07 ENCOUNTER — Ambulatory Visit (INDEPENDENT_AMBULATORY_CARE_PROVIDER_SITE_OTHER): Payer: Medicare Other | Admitting: Obstetrics and Gynecology

## 2015-05-07 VITALS — BP 116/73 | HR 91 | Wt 244.9 lb

## 2015-05-07 DIAGNOSIS — F319 Bipolar disorder, unspecified: Secondary | ICD-10-CM | POA: Diagnosis not present

## 2015-05-07 DIAGNOSIS — F17210 Nicotine dependence, cigarettes, uncomplicated: Secondary | ICD-10-CM

## 2015-05-07 DIAGNOSIS — O26849 Uterine size-date discrepancy, unspecified trimester: Secondary | ICD-10-CM

## 2015-05-07 DIAGNOSIS — Z23 Encounter for immunization: Secondary | ICD-10-CM

## 2015-05-07 DIAGNOSIS — O99333 Smoking (tobacco) complicating pregnancy, third trimester: Secondary | ICD-10-CM

## 2015-05-07 DIAGNOSIS — O26843 Uterine size-date discrepancy, third trimester: Secondary | ICD-10-CM | POA: Diagnosis not present

## 2015-05-07 DIAGNOSIS — O99343 Other mental disorders complicating pregnancy, third trimester: Secondary | ICD-10-CM

## 2015-05-07 DIAGNOSIS — F17200 Nicotine dependence, unspecified, uncomplicated: Secondary | ICD-10-CM

## 2015-05-07 DIAGNOSIS — O0990 Supervision of high risk pregnancy, unspecified, unspecified trimester: Secondary | ICD-10-CM

## 2015-05-07 LAB — POCT URINALYSIS DIP (DEVICE)
Glucose, UA: NEGATIVE mg/dL
HGB URINE DIPSTICK: NEGATIVE
Ketones, ur: NEGATIVE mg/dL
Nitrite: NEGATIVE
PH: 6 (ref 5.0–8.0)
Protein, ur: 30 mg/dL — AB
UROBILINOGEN UA: 0.2 mg/dL (ref 0.0–1.0)

## 2015-05-07 MED ORDER — TETANUS-DIPHTH-ACELL PERTUSSIS 5-2.5-18.5 LF-MCG/0.5 IM SUSP
0.5000 mL | Freq: Once | INTRAMUSCULAR | Status: AC
  Administered 2015-05-07: 0.5 mL via INTRAMUSCULAR

## 2015-05-07 NOTE — Progress Notes (Signed)
Subjective:  Nichole Lopez is a 30 y.o. G3P1011 at 7160w2d being seen today for ongoing prenatal care.  She is currently monitored for the following issues for this high-risk pregnancy and has Alcohol dependence, continuous (HCC); Substance-induced disorder (HCC); Post traumatic stress disorder (PTSD); Borderline personality disorder; Bipolar disorder, current episode manic w/o psychotic features, severe (HCC); Major depressive disorder, recurrent episode, severe, without mention of psychotic behavior; Supervision of high risk pregnancy, antepartum; Alcohol consumption during pregnancy in first trimester; Medication exposure during first trimester of pregnancy; Drug abuse during pregnancy; Social problems, antepartum; and Tobacco use disorder on her problem list.  Patient reports no complaints.  Contractions: Not present. Vag. Bleeding: None.  Movement: Present. Denies leaking of fluid.   The following portions of the patient's history were reviewed and updated as appropriate: allergies, current medications, past family history, past medical history, past social history, past surgical history and problem list. Problem list updated.  Objective:   Filed Vitals:   05/07/15 1107  BP: 116/73  Pulse: 91  Weight: 244 lb 14.4 oz (111.086 kg)    Fetal Status: Fetal Heart Rate (bpm): 159   Movement: Present     General:  Alert, oriented and cooperative. Patient is in no acute distress.  Skin: Skin is warm and dry. No rash noted.   Cardiovascular: Normal heart rate noted  Respiratory: Normal respiratory effort, no problems with respiration noted  Abdomen: Soft, gravid, appropriate for gestational age. Pain/Pressure: Absent     Pelvic: Vag. Bleeding: None     Cervical exam deferred        Extremities: Normal range of motion.  Edema: None  Mental Status: Normal mood and affect. Normal behavior. Normal judgment and thought content.   Urinalysis: Urine Protein: 1+ Urine Glucose: Negative  Assessment  and Plan:  Pregnancy: G3P1011 at 4360w2d  1. Need for Tdap vaccination - Tdap (BOOSTRIX) injection 0.5 mL; Inject 0.5 mLs into the muscle once.  2. Size date discrepancy - u/s for growth ordered  3. Tobacco use disorder - reduction/quitting advised  4. Trich - toc at next visit; treated, partner treated.  Preterm labor symptoms and general obstetric precautions including but not limited to vaginal bleeding, contractions, leaking of fluid and fetal movement were reviewed in detail with the patient. Please refer to After Visit Summary for other counseling recommendations.  F/u 2 weeks  Kathrynn RunningNoah Bedford Wouk, MD

## 2015-05-07 NOTE — Progress Notes (Signed)
U/S scheduled 04/07 @ 215pm.

## 2015-05-15 ENCOUNTER — Encounter (HOSPITAL_COMMUNITY): Payer: Self-pay

## 2015-05-15 ENCOUNTER — Other Ambulatory Visit: Payer: Self-pay | Admitting: Obstetrics and Gynecology

## 2015-05-15 ENCOUNTER — Ambulatory Visit (HOSPITAL_COMMUNITY)
Admission: RE | Admit: 2015-05-15 | Discharge: 2015-05-15 | Disposition: A | Discharge status: Home / Self Care | Payer: Medicare Other | Source: Ambulatory Visit | Attending: Obstetrics and Gynecology | Admitting: Obstetrics and Gynecology

## 2015-05-15 ENCOUNTER — Ambulatory Visit (HOSPITAL_COMMUNITY): Payer: Medicare Other

## 2015-05-15 ENCOUNTER — Other Ambulatory Visit (HOSPITAL_COMMUNITY): Payer: Self-pay | Admitting: Maternal and Fetal Medicine

## 2015-05-15 DIAGNOSIS — O99333 Smoking (tobacco) complicating pregnancy, third trimester: Secondary | ICD-10-CM | POA: Insufficient documentation

## 2015-05-15 DIAGNOSIS — O99343 Other mental disorders complicating pregnancy, third trimester: Secondary | ICD-10-CM | POA: Insufficient documentation

## 2015-05-15 DIAGNOSIS — Z720 Tobacco use: Secondary | ICD-10-CM

## 2015-05-15 DIAGNOSIS — O99323 Drug use complicating pregnancy, third trimester: Secondary | ICD-10-CM | POA: Insufficient documentation

## 2015-05-15 DIAGNOSIS — F1920 Other psychoactive substance dependence, uncomplicated: Secondary | ICD-10-CM | POA: Insufficient documentation

## 2015-05-15 DIAGNOSIS — O26849 Uterine size-date discrepancy, unspecified trimester: Secondary | ICD-10-CM

## 2015-05-15 DIAGNOSIS — Z3A33 33 weeks gestation of pregnancy: Secondary | ICD-10-CM | POA: Diagnosis not present

## 2015-05-21 ENCOUNTER — Ambulatory Visit (INDEPENDENT_AMBULATORY_CARE_PROVIDER_SITE_OTHER): Payer: Medicare Other | Admitting: Family Medicine

## 2015-05-21 VITALS — BP 121/76 | HR 85 | Temp 98.4°F | Wt 244.0 lb

## 2015-05-21 DIAGNOSIS — O99323 Drug use complicating pregnancy, third trimester: Secondary | ICD-10-CM | POA: Diagnosis not present

## 2015-05-21 DIAGNOSIS — F1910 Other psychoactive substance abuse, uncomplicated: Secondary | ICD-10-CM | POA: Diagnosis not present

## 2015-05-21 DIAGNOSIS — O99320 Drug use complicating pregnancy, unspecified trimester: Secondary | ICD-10-CM

## 2015-05-21 DIAGNOSIS — O09891 Supervision of other high risk pregnancies, first trimester: Secondary | ICD-10-CM

## 2015-05-21 DIAGNOSIS — O0993 Supervision of high risk pregnancy, unspecified, third trimester: Secondary | ICD-10-CM

## 2015-05-21 LAB — POCT URINALYSIS DIP (DEVICE)
Bilirubin Urine: NEGATIVE
GLUCOSE, UA: NEGATIVE mg/dL
HGB URINE DIPSTICK: NEGATIVE
KETONES UR: NEGATIVE mg/dL
Nitrite: NEGATIVE
PROTEIN: 100 mg/dL — AB
Urobilinogen, UA: 1 mg/dL (ref 0.0–1.0)
pH: 6.5 (ref 5.0–8.0)

## 2015-05-21 NOTE — Patient Instructions (Signed)
Third Trimester of Pregnancy The third trimester is from week 29 through week 42, months 7 through 9. The third trimester is a time when the fetus is growing rapidly. At the end of the ninth month, the fetus is about 20 inches in length and weighs 6-10 pounds.  BODY CHANGES Your body goes through many changes during pregnancy. The changes vary from woman to woman.   Your weight will continue to increase. You can expect to gain 25-35 pounds (11-16 kg) by the end of the pregnancy.  You may begin to get stretch marks on your hips, abdomen, and breasts.  You may urinate more often because the fetus is moving lower into your pelvis and pressing on your bladder.  You may develop or continue to have heartburn as a result of your pregnancy.  You may develop constipation because certain hormones are causing the muscles that push waste through your intestines to slow down.  You may develop hemorrhoids or swollen, bulging veins (varicose veins).  You may have pelvic pain because of the weight gain and pregnancy hormones relaxing your joints between the bones in your pelvis. Backaches may result from overexertion of the muscles supporting your posture.  You may have changes in your hair. These can include thickening of your hair, rapid growth, and changes in texture. Some women also have hair loss during or after pregnancy, or hair that feels dry or thin. Your hair will most likely return to normal after your baby is born.  Your breasts will continue to grow and be tender. A yellow discharge may leak from your breasts called colostrum.  Your belly button may stick out.  You may feel short of breath because of your expanding uterus.  You may notice the fetus "dropping," or moving lower in your abdomen.  You may have a bloody mucus discharge. This usually occurs a few days to a week before labor begins.  Your cervix becomes thin and soft (effaced) near your due date. WHAT TO EXPECT AT YOUR  PRENATAL EXAMS  You will have prenatal exams every 2 weeks until week 36. Then, you will have weekly prenatal exams. During a routine prenatal visit:  You will be weighed to make sure you and the fetus are growing normally.  Your blood pressure is taken.  Your abdomen will be measured to track your baby's growth.  The fetal heartbeat will be listened to.  Any test results from the previous visit will be discussed.  You may have a cervical check near your due date to see if you have effaced. At around 36 weeks, your caregiver will check your cervix. At the same time, your caregiver will also perform a test on the secretions of the vaginal tissue. This test is to determine if a type of bacteria, Group B streptococcus, is present. Your caregiver will explain this further. Your caregiver may ask you:  What your birth plan is.  How you are feeling.  If you are feeling the baby move.  If you have had any abnormal symptoms, such as leaking fluid, bleeding, severe headaches, or abdominal cramping.  If you are using any tobacco products, including cigarettes, chewing tobacco, and electronic cigarettes.  If you have any questions. Other tests or screenings that may be performed during your third trimester include:  Blood tests that check for low iron levels (anemia).  Fetal testing to check the health, activity level, and growth of the fetus. Testing is done if you have certain medical conditions or if   there are problems during the pregnancy.  HIV (human immunodeficiency virus) testing. If you are at high risk, you may be screened for HIV during your third trimester of pregnancy. FALSE LABOR You may feel small, irregular contractions that eventually go away. These are called Braxton Hicks contractions, or false labor. Contractions may last for hours, days, or even weeks before true labor sets in. If contractions come at regular intervals, intensify, or become painful, it is best to be seen  by your caregiver.  SIGNS OF LABOR   Menstrual-like cramps.  Contractions that are 5 minutes apart or less.  Contractions that start on the top of the uterus and spread down to the lower abdomen and back.  A sense of increased pelvic pressure or back pain.  A watery or bloody mucus discharge that comes from the vagina. If you have any of these signs before the 37th week of pregnancy, call your caregiver right away. You need to go to the hospital to get checked immediately. HOME CARE INSTRUCTIONS   Avoid all smoking, herbs, alcohol, and unprescribed drugs. These chemicals affect the formation and growth of the baby.  Do not use any tobacco products, including cigarettes, chewing tobacco, and electronic cigarettes. If you need help quitting, ask your health care provider. You may receive counseling support and other resources to help you quit.  Follow your caregiver's instructions regarding medicine use. There are medicines that are either safe or unsafe to take during pregnancy.  Exercise only as directed by your caregiver. Experiencing uterine cramps is a good sign to stop exercising.  Continue to eat regular, healthy meals.  Wear a good support bra for breast tenderness.  Do not use hot tubs, steam rooms, or saunas.  Wear your seat belt at all times when driving.  Avoid raw meat, uncooked cheese, cat litter boxes, and soil used by cats. These carry germs that can cause birth defects in the baby.  Take your prenatal vitamins.  Take 1500-2000 mg of calcium daily starting at the 20th week of pregnancy until you deliver your baby.  Try taking a stool softener (if your caregiver approves) if you develop constipation. Eat more high-fiber foods, such as fresh vegetables or fruit and whole grains. Drink plenty of fluids to keep your urine clear or pale yellow.  Take warm sitz baths to soothe any pain or discomfort caused by hemorrhoids. Use hemorrhoid cream if your caregiver  approves.  If you develop varicose veins, wear support hose. Elevate your feet for 15 minutes, 3-4 times a day. Limit salt in your diet.  Avoid heavy lifting, wear low heal shoes, and practice good posture.  Rest a lot with your legs elevated if you have leg cramps or low back pain.  Visit your dentist if you have not gone during your pregnancy. Use a soft toothbrush to brush your teeth and be gentle when you floss.  A sexual relationship may be continued unless your caregiver directs you otherwise.  Do not travel far distances unless it is absolutely necessary and only with the approval of your caregiver.  Take prenatal classes to understand, practice, and ask questions about the labor and delivery.  Make a trial run to the hospital.  Pack your hospital bag.  Prepare the baby's nursery.  Continue to go to all your prenatal visits as directed by your caregiver. SEEK MEDICAL CARE IF:  You are unsure if you are in labor or if your water has broken.  You have dizziness.  You have   mild pelvic cramps, pelvic pressure, or nagging pain in your abdominal area.  You have persistent nausea, vomiting, or diarrhea.  You have a bad smelling vaginal discharge.  You have pain with urination. SEEK IMMEDIATE MEDICAL CARE IF:   You have a fever.  You are leaking fluid from your vagina.  You have spotting or bleeding from your vagina.  You have severe abdominal cramping or pain.  You have rapid weight loss or gain.  You have shortness of breath with chest pain.  You notice sudden or extreme swelling of your face, hands, ankles, feet, or legs.  You have not felt your baby move in over an hour.  You have severe headaches that do not go away with medicine.  You have vision changes.   This information is not intended to replace advice given to you by your health care provider. Make sure you discuss any questions you have with your health care provider.   Document Released:  01/18/2001 Document Revised: 02/14/2014 Document Reviewed: 03/27/2012 Elsevier Interactive Patient Education 2016 [employer]  Breastfeeding Deciding to breastfeed is one of the best choices you can make for you and your baby. A change in hormones during pregnancy causes your breast tissue to grow and increases the number and size of your milk ducts. These hormones also allow proteins, sugars, and fats from your blood supply to make breast milk in your milk-producing glands. Hormones prevent breast milk from being released before your baby is born as well as prompt milk flow after birth. Once breastfeeding has begun, thoughts of your baby, as well as his or her sucking or crying, can stimulate the release of milk from your milk-producing glands.  BENEFITS OF BREASTFEEDING For Your Baby  Your first milk (colostrum) helps your baby's digestive system function better.  There are antibodies in your milk that help your baby fight off infections.  Your baby has a lower incidence of asthma, allergies, and sudden infant death syndrome.  The nutrients in breast milk are better for your baby than infant formulas and are designed uniquely for your baby's needs.  Breast milk improves your baby's brain development.  Your baby is less likely to develop other conditions, such as childhood obesity, asthma, or type 2 diabetes mellitus. For You  Breastfeeding helps to create a very special bond between you and your baby.  Breastfeeding is convenient. Breast milk is always available at the correct temperature and costs nothing.  Breastfeeding helps to burn calories and helps you lose the weight gained during pregnancy.  Breastfeeding makes your uterus contract to its prepregnancy size faster and slows bleeding (lochia) after you give birth.   Breastfeeding helps to lower your risk of developing type 2 diabetes mellitus, osteoporosis, and breast or ovarian cancer later in life. SIGNS THAT YOUR BABY IS  HUNGRY Early Signs of Hunger  Increased alertness or activity.  Stretching.  Movement of the head from side to side.  Movement of the head and opening of the mouth when the corner of the mouth or cheek is stroked (rooting).  Increased sucking sounds, smacking lips, cooing, sighing, or squeaking.  Hand-to-mouth movements.  Increased sucking of fingers or hands. Late Signs of Hunger  Fussing.  Intermittent crying. Extreme Signs of Hunger Signs of extreme hunger will require calming and consoling before your baby will be able to breastfeed successfully. Do not wait for the following signs of extreme hunger to occur before you initiate breastfeeding:  Restlessness.  A loud, strong cry.  Screaming.   BREASTFEEDING BASICS Breastfeeding Initiation  Find a comfortable place to sit or lie down, with your neck and back well supported.  Place a pillow or rolled up blanket under your baby to bring him or her to the level of your breast (if you are seated). Nursing pillows are specially designed to help support your arms and your baby while you breastfeed.  Make sure that your baby's abdomen is facing your abdomen.  Gently massage your breast. With your fingertips, massage from your chest wall toward your nipple in a circular motion. This encourages milk flow. You may need to continue this action during the feeding if your milk flows slowly.  Support your breast with 4 fingers underneath and your thumb above your nipple. Make sure your fingers are well away from your nipple and your baby's mouth.  Stroke your baby's lips gently with your finger or nipple.  When your baby's mouth is open wide enough, quickly bring your baby to your breast, placing your entire nipple and as much of the colored area around your nipple (areola) as possible into your baby's mouth.  More areola should be visible above your baby's upper lip than below the lower lip.  Your baby's tongue should be between his  or her lower gum and your breast.  Ensure that your baby's mouth is correctly positioned around your nipple (latched). Your baby's lips should create a seal on your breast and be turned out (everted).  It is common for your baby to suck about 2-3 minutes in order to start the flow of breast milk. Latching Teaching your baby how to latch on to your breast properly is very important. An improper latch can cause nipple pain and decreased milk supply for you and poor weight gain in your baby. Also, if your baby is not latched onto your nipple properly, he or she may swallow some air during feeding. This can make your baby fussy. Burping your baby when you switch breasts during the feeding can help to get rid of the air. However, teaching your baby to latch on properly is still the best way to prevent fussiness from swallowing air while breastfeeding. Signs that your baby has successfully latched on to your nipple:  Silent tugging or silent sucking, without causing you pain.  Swallowing heard between every 3-4 sucks.  Muscle movement above and in front of his or her ears while sucking. Signs that your baby has not successfully latched on to nipple:  Sucking sounds or smacking sounds from your baby while breastfeeding.  Nipple pain. If you think your baby has not latched on correctly, slip your finger into the corner of your baby's mouth to break the suction and place it between your baby's gums. Attempt breastfeeding initiation again. Signs of Successful Breastfeeding Signs from your baby:  A gradual decrease in the number of sucks or complete cessation of sucking.  Falling asleep.  Relaxation of his or her body.  Retention of a small amount of milk in his or her mouth.  Letting go of your breast by himself or herself. Signs from you:  Breasts that have increased in firmness, weight, and size 1-3 hours after feeding.  Breasts that are softer immediately after  breastfeeding.  Increased milk volume, as well as a change in milk consistency and color by the fifth day of breastfeeding.  Nipples that are not sore, cracked, or bleeding. Signs That Your Baby is Getting Enough Milk  Wetting at least 3 diapers in a 24-hour period.   The urine should be clear and pale yellow by age 5 days.  At least 3 stools in a 24-hour period by age 5 days. The stool should be soft and yellow.  At least 3 stools in a 24-hour period by age 7 days. The stool should be seedy and yellow.  No loss of weight greater than 10% of birth weight during the first 3 days of age.  Average weight gain of 4-7 ounces (113-198 g) per week after age 4 days.  Consistent daily weight gain by age 5 days, without weight loss after the age of 2 weeks. After a feeding, your baby may spit up a small amount. This is common. BREASTFEEDING FREQUENCY AND DURATION Frequent feeding will help you make more milk and can prevent sore nipples and breast engorgement. Breastfeed when you feel the need to reduce the fullness of your breasts or when your baby shows signs of hunger. This is called "breastfeeding on demand." Avoid introducing a pacifier to your baby while you are working to establish breastfeeding (the first 4-6 weeks after your baby is born). After this time you may choose to use a pacifier. Research has shown that pacifier use during the first year of a baby's life decreases the risk of sudden infant death syndrome (SIDS). Allow your baby to feed on each breast as long as he or she wants. Breastfeed until your baby is finished feeding. When your baby unlatches or falls asleep while feeding from the first breast, offer the second breast. Because newborns are often sleepy in the first few weeks of life, you may need to awaken your baby to get him or her to feed. Breastfeeding times will vary from baby to baby. However, the following rules can serve as a guide to help you ensure that your baby is  properly fed:  Newborns (babies 4 weeks of age or younger) may breastfeed every 1-3 hours.  Newborns should not go longer than 3 hours during the day or 5 hours during the night without breastfeeding.  You should breastfeed your baby a minimum of 8 times in a 24-hour period until you begin to introduce solid foods to your baby at around 6 months of age. BREAST MILK PUMPING Pumping and storing breast milk allows you to ensure that your baby is exclusively fed your breast milk, even at times when you are unable to breastfeed. This is especially important if you are going back to work while you are still breastfeeding or when you are not able to be present during feedings. Your lactation consultant can give you guidelines on how long it is safe to store breast milk. A breast pump is a machine that allows you to pump milk from your breast into a sterile bottle. The pumped breast milk can then be stored in a refrigerator or freezer. Some breast pumps are operated by hand, while others use electricity. Ask your lactation consultant which type will work best for you. Breast pumps can be purchased, but some hospitals and breastfeeding support groups lease breast pumps on a monthly basis. A lactation consultant can teach you how to hand express breast milk, if you prefer not to use a pump. CARING FOR YOUR BREASTS WHILE YOU BREASTFEED Nipples can become dry, cracked, and sore while breastfeeding. The following recommendations can help keep your breasts moisturized and healthy:  Avoid using soap on your nipples.  Wear a supportive bra. Although not required, special nursing bras and tank tops are designed to allow access to your   breasts for breastfeeding without taking off your entire bra or top. Avoid wearing underwire-style bras or extremely tight bras.  Air dry your nipples for 3-4minutes after each feeding.  Use only cotton bra pads to absorb leaked breast milk. Leaking of breast milk between feedings  is normal.  Use lanolin on your nipples after breastfeeding. Lanolin helps to maintain your skin's normal moisture barrier. If you use pure lanolin, you do not need to wash it off before feeding your baby again. Pure lanolin is not toxic to your baby. You may also hand express a few drops of breast milk and gently massage that milk into your nipples and allow the milk to air dry. In the first few weeks after giving birth, some women experience extremely full breasts (engorgement). Engorgement can make your breasts feel heavy, warm, and tender to the touch. Engorgement peaks within 3-5 days after you give birth. The following recommendations can help ease engorgement:  Completely empty your breasts while breastfeeding or pumping. You may want to start by applying warm, moist heat (in the shower or with warm water-soaked hand towels) just before feeding or pumping. This increases circulation and helps the milk flow. If your baby does not completely empty your breasts while breastfeeding, pump any extra milk after he or she is finished.  Wear a snug bra (nursing or regular) or tank top for 1-2 days to signal your body to slightly decrease milk production.  Apply ice packs to your breasts, unless this is too uncomfortable for you.  Make sure that your baby is latched on and positioned properly while breastfeeding. If engorgement persists after 48 hours of following these recommendations, contact your health care provider or a lactation consultant. OVERALL HEALTH CARE RECOMMENDATIONS WHILE BREASTFEEDING  Eat healthy foods. Alternate between meals and snacks, eating 3 of each per day. Because what you eat affects your breast milk, some of the foods may make your baby more irritable than usual. Avoid eating these foods if you are sure that they are negatively affecting your baby.  Drink milk, fruit juice, and water to satisfy your thirst (about 10 glasses a day).  Rest often, relax, and continue to take  your prenatal vitamins to prevent fatigue, stress, and anemia.  Continue breast self-awareness checks.  Avoid chewing and smoking tobacco. Chemicals from cigarettes that pass into breast milk and exposure to secondhand smoke may harm your baby.  Avoid alcohol and drug use, including marijuana. Some medicines that may be harmful to your baby can pass through breast milk. It is important to ask your health care provider before taking any medicine, including all over-the-counter and prescription medicine as well as vitamin and herbal supplements. It is possible to become pregnant while breastfeeding. If birth control is desired, ask your health care provider about options that will be safe for your baby. SEEK MEDICAL CARE IF:  You feel like you want to stop breastfeeding or have become frustrated with breastfeeding.  You have painful breasts or nipples.  Your nipples are cracked or bleeding.  Your breasts are red, tender, or warm.  You have a swollen area on either breast.  You have a fever or chills.  You have nausea or vomiting.  You have drainage other than breast milk from your nipples.  Your breasts do not become full before feedings by the fifth day after you give birth.  You feel sad and depressed.  Your baby is too sleepy to eat well.  Your baby is having trouble sleeping.     Your baby is wetting less than 3 diapers in a 24-hour period.  Your baby has less than 3 stools in a 24-hour period.  Your baby's skin or the white part of his or her eyes becomes yellow.   Your baby is not gaining weight by 5 days of age. SEEK IMMEDIATE MEDICAL CARE IF:  Your baby is overly tired (lethargic) and does not want to wake up and feed.  Your baby develops an unexplained fever.   This information is not intended to replace advice given to you by your health care provider. Make sure you discuss any questions you have with your health care provider.   Document Released: 01/24/2005  Document Revised: 10/15/2014 Document Reviewed: 07/18/2012 Elsevier Interactive Patient Education 2016 [employer]  

## 2015-05-21 NOTE — Progress Notes (Signed)
Subjective:  Nichole Lopez is a 30 y.o. G3P1011 at 7625w2d being seen today for ongoing prenatal care.  She is currently monitored for the following issues for this high-risk pregnancy and has Alcohol dependence, continuous (HCC); Substance-induced disorder (HCC); Post traumatic stress disorder (PTSD); Borderline personality disorder; Bipolar disorder, current episode manic w/o psychotic features, severe (HCC); Major depressive disorder, recurrent episode, severe, without mention of psychotic behavior; Supervision of high risk pregnancy, antepartum; Alcohol consumption during pregnancy in first trimester; Medication exposure during first trimester of pregnancy; Drug abuse during pregnancy; Social problems, antepartum; and Tobacco use disorder on her problem list.  Patient reports no complaints.  Contractions: Not present. Vag. Bleeding: None.  Movement: Present. Denies leaking of fluid.   The following portions of the patient's history were reviewed and updated as appropriate: allergies, current medications, past family history, past medical history, past social history, past surgical history and problem list. Problem list updated.  Objective:   Filed Vitals:   05/21/15 1055  BP: 121/76  Pulse: 85  Temp: 98.4 F (36.9 C)  Weight: 244 lb (110.678 kg)    Fetal Status: Fetal Heart Rate (bpm): 150 Fundal Height: 34 cm Movement: Present     General:  Alert, oriented and cooperative. Patient is in no acute distress.  Skin: Skin is warm and dry. No rash noted.   Cardiovascular: Normal heart rate noted  Respiratory: Normal respiratory effort, no problems with respiration noted  Abdomen: Soft, gravid, appropriate for gestational age. Pain/Pressure: Present     Pelvic: Vag. Bleeding: None     Cervical exam deferred        Extremities: Normal range of motion.  Edema: None  Mental Status: Normal mood and affect. Normal behavior. Normal judgment and thought content.   Urinalysis: Urine Protein: 2+  Urine Glucose: Negative  Assessment and Plan:  Pregnancy: G3P1011 at 2625w2d  1. Supervision of high risk pregnancy, antepartum, third trimester Continue prenatal care.  2. Medication exposure during first trimester of pregnancy For serial u/s for growth - last 78%--for f/u next week  3. Drug abuse during pregnancy Advised--[place]--advised of how system works.  Preterm labor symptoms and general obstetric precautions including but not limited to vaginal bleeding, contractions, leaking of fluid and fetal movement were reviewed in detail with the patient. Please refer to After Visit Summary for other counseling recommendations.  Return in 2 weeks (on 06/04/2015).   Reva Boresanya S Nguyen Butler, MD

## 2015-05-29 ENCOUNTER — Encounter (HOSPITAL_COMMUNITY): Payer: Self-pay

## 2015-05-29 ENCOUNTER — Ambulatory Visit (HOSPITAL_COMMUNITY)
Admission: RE | Admit: 2015-05-29 | Discharge: 2015-05-29 | Disposition: A | Discharge status: Home / Self Care | Payer: Medicare Other | Source: Ambulatory Visit | Attending: Obstetrics and Gynecology | Admitting: Obstetrics and Gynecology

## 2015-05-29 DIAGNOSIS — F329 Major depressive disorder, single episode, unspecified: Secondary | ICD-10-CM | POA: Insufficient documentation

## 2015-05-29 DIAGNOSIS — F4310 Post-traumatic stress disorder, unspecified: Secondary | ICD-10-CM | POA: Insufficient documentation

## 2015-05-29 DIAGNOSIS — F209 Schizophrenia, unspecified: Secondary | ICD-10-CM | POA: Diagnosis not present

## 2015-05-29 DIAGNOSIS — O99333 Smoking (tobacco) complicating pregnancy, third trimester: Secondary | ICD-10-CM | POA: Diagnosis not present

## 2015-05-29 DIAGNOSIS — O99343 Other mental disorders complicating pregnancy, third trimester: Secondary | ICD-10-CM | POA: Insufficient documentation

## 2015-05-29 DIAGNOSIS — O99323 Drug use complicating pregnancy, third trimester: Secondary | ICD-10-CM | POA: Insufficient documentation

## 2015-05-29 DIAGNOSIS — Z720 Tobacco use: Secondary | ICD-10-CM | POA: Insufficient documentation

## 2015-05-29 DIAGNOSIS — Z3A35 35 weeks gestation of pregnancy: Secondary | ICD-10-CM | POA: Insufficient documentation

## 2015-06-04 ENCOUNTER — Other Ambulatory Visit (HOSPITAL_COMMUNITY)
Admission: RE | Admit: 2015-06-04 | Discharge: 2015-06-04 | Disposition: A | Discharge status: Home / Self Care | Payer: Medicare Other | Source: Ambulatory Visit | Attending: Obstetrics and Gynecology | Admitting: Obstetrics and Gynecology

## 2015-06-04 ENCOUNTER — Ambulatory Visit (INDEPENDENT_AMBULATORY_CARE_PROVIDER_SITE_OTHER): Payer: Medicare Other | Admitting: Obstetrics and Gynecology

## 2015-06-04 VITALS — BP 120/79 | HR 117 | Wt 242.6 lb

## 2015-06-04 DIAGNOSIS — Z113 Encounter for screening for infections with a predominantly sexual mode of transmission: Secondary | ICD-10-CM | POA: Insufficient documentation

## 2015-06-04 DIAGNOSIS — A5901 Trichomonal vulvovaginitis: Secondary | ICD-10-CM | POA: Diagnosis not present

## 2015-06-04 DIAGNOSIS — O99322 Drug use complicating pregnancy, second trimester: Secondary | ICD-10-CM

## 2015-06-04 DIAGNOSIS — O99323 Drug use complicating pregnancy, third trimester: Secondary | ICD-10-CM

## 2015-06-04 DIAGNOSIS — N760 Acute vaginitis: Secondary | ICD-10-CM | POA: Insufficient documentation

## 2015-06-04 DIAGNOSIS — O0990 Supervision of high risk pregnancy, unspecified, unspecified trimester: Secondary | ICD-10-CM

## 2015-06-04 DIAGNOSIS — F1920 Other psychoactive substance dependence, uncomplicated: Secondary | ICD-10-CM | POA: Diagnosis not present

## 2015-06-04 DIAGNOSIS — O98313 Other infections with a predominantly sexual mode of transmission complicating pregnancy, third trimester: Secondary | ICD-10-CM | POA: Diagnosis not present

## 2015-06-04 LAB — POCT URINALYSIS DIP (DEVICE)
Glucose, UA: NEGATIVE mg/dL
HGB URINE DIPSTICK: NEGATIVE
KETONES UR: 40 mg/dL — AB
Leukocytes, UA: NEGATIVE
Nitrite: NEGATIVE
PH: 6 (ref 5.0–8.0)
Protein, ur: 30 mg/dL — AB
SPECIFIC GRAVITY, URINE: 1.025 (ref 1.005–1.030)
Urobilinogen, UA: 2 mg/dL — ABNORMAL HIGH (ref 0.0–1.0)

## 2015-06-04 LAB — OB RESULTS CONSOLE GBS: STREP GROUP B AG: NEGATIVE

## 2015-06-04 NOTE — Progress Notes (Signed)
Subjective:  Nichole Lopez is a 30 y.o. G3P1011 at 6282w2d being seen today for ongoing prenatal care.  She is currently monitored for the following issues for this high-risk pregnancy and has Alcohol dependence, continuous (HCC); Substance-induced disorder (HCC); Post traumatic stress disorder (PTSD); Borderline personality disorder; Bipolar disorder, current episode manic w/o psychotic features, severe (HCC); Major depressive disorder, recurrent episode, severe, without mention of psychotic behavior; Supervision of high risk pregnancy, antepartum; Alcohol consumption during pregnancy in first trimester; Medication exposure during first trimester of pregnancy; Drug abuse during pregnancy; Social problems, antepartum; and Tobacco use disorder on her problem list.  Patient reports no complaints.  Contractions: Irregular. Vag. Bleeding: None.  Movement: Present. Denies leaking of fluid.   The following portions of the patient's history were reviewed and updated as appropriate: allergies, current medications, past family history, past medical history, past social history, past surgical history and problem list. Problem list updated.  Objective:   Filed Vitals:   06/04/15 1125  BP: 120/79  Pulse: 117  Weight: 242 lb 9.6 oz (110.043 kg)    Fetal Status: Fetal Heart Rate (bpm): 154   Movement: Present     General:  Alert, oriented and cooperative. Patient is in no acute distress.  Skin: Skin is warm and dry. No rash noted.   Cardiovascular: Normal heart rate noted  Respiratory: Normal respiratory effort, no problems with respiration noted  Abdomen: Soft, gravid, appropriate for gestational age. Pain/Pressure: Absent     Pelvic: Vag. Bleeding: None     Cervical exam performed      1.5/0/-3  Extremities: Normal range of motion.     Mental Status: Normal mood and affect. Normal behavior. Normal judgment and thought content.   Urinalysis:      Assessment and Plan:  Pregnancy: G3P1011 at  4782w2d  # pregnancy - gbs/g/c today - f/u one week  # Trich - toc today w/ naat  # polysubstance - growth scan @ 35 wks wnl  There are no diagnoses linked to this encounter. Term labor symptoms and general obstetric precautions including but not limited to vaginal bleeding, contractions, leaking of fluid and fetal movement were reviewed in detail with the patient. Please refer to After Visit Summary for other counseling recommendations.    Kathrynn RunningNoah Bedford Wouk, MD

## 2015-06-05 LAB — OB RESULTS CONSOLE GC/CHLAMYDIA
Chlamydia: NEGATIVE
Gonorrhea: NEGATIVE

## 2015-06-05 LAB — GC/CHLAMYDIA PROBE AMP (~~LOC~~) NOT AT ARMC
Chlamydia: NEGATIVE
Neisseria Gonorrhea: NEGATIVE

## 2015-06-05 LAB — CERVICOVAGINAL ANCILLARY ONLY: WET PREP (BD AFFIRM): NEGATIVE

## 2015-06-06 LAB — CULTURE, BETA STREP (GROUP B ONLY)

## 2015-06-08 ENCOUNTER — Encounter (HOSPITAL_COMMUNITY): Payer: Self-pay | Admitting: *Deleted

## 2015-06-08 ENCOUNTER — Inpatient Hospital Stay (HOSPITAL_COMMUNITY)
Admission: AD | Admit: 2015-06-08 | Discharge: 2015-06-08 | Disposition: A | Discharge status: Home / Self Care | Payer: Medicare Other | Source: Ambulatory Visit | Attending: Family Medicine | Admitting: Family Medicine

## 2015-06-08 DIAGNOSIS — O99311 Alcohol use complicating pregnancy, first trimester: Secondary | ICD-10-CM

## 2015-06-08 DIAGNOSIS — N898 Other specified noninflammatory disorders of vagina: Secondary | ICD-10-CM | POA: Insufficient documentation

## 2015-06-08 DIAGNOSIS — O26893 Other specified pregnancy related conditions, third trimester: Secondary | ICD-10-CM | POA: Diagnosis present

## 2015-06-08 DIAGNOSIS — F1910 Other psychoactive substance abuse, uncomplicated: Secondary | ICD-10-CM

## 2015-06-08 DIAGNOSIS — Z3A36 36 weeks gestation of pregnancy: Secondary | ICD-10-CM | POA: Diagnosis not present

## 2015-06-08 DIAGNOSIS — O09891 Supervision of other high risk pregnancies, first trimester: Secondary | ICD-10-CM

## 2015-06-08 DIAGNOSIS — O99320 Drug use complicating pregnancy, unspecified trimester: Secondary | ICD-10-CM

## 2015-06-08 NOTE — MAU Note (Signed)
Urine collected and sent to lab.

## 2015-06-08 NOTE — MAU Note (Signed)
Pt presents with complaint of brownish discharge this am and then some spotting off/on today, cramping.

## 2015-06-10 ENCOUNTER — Encounter (HOSPITAL_COMMUNITY): Payer: Self-pay | Admitting: *Deleted

## 2015-06-10 ENCOUNTER — Inpatient Hospital Stay (HOSPITAL_COMMUNITY)
Admission: AD | Admit: 2015-06-10 | Discharge: 2015-06-10 | Disposition: A | Discharge status: Home / Self Care | Payer: Medicare Other | Source: Ambulatory Visit | Attending: Obstetrics and Gynecology | Admitting: Obstetrics and Gynecology

## 2015-06-10 DIAGNOSIS — O99320 Drug use complicating pregnancy, unspecified trimester: Secondary | ICD-10-CM

## 2015-06-10 DIAGNOSIS — F1910 Other psychoactive substance abuse, uncomplicated: Secondary | ICD-10-CM

## 2015-06-10 DIAGNOSIS — O09891 Supervision of other high risk pregnancies, first trimester: Secondary | ICD-10-CM

## 2015-06-10 DIAGNOSIS — O0993 Supervision of high risk pregnancy, unspecified, third trimester: Secondary | ICD-10-CM

## 2015-06-10 DIAGNOSIS — O99311 Alcohol use complicating pregnancy, first trimester: Secondary | ICD-10-CM

## 2015-06-10 DIAGNOSIS — O0973 Supervision of high risk pregnancy due to social problems, third trimester: Secondary | ICD-10-CM

## 2015-06-10 LAB — URINALYSIS, ROUTINE W REFLEX MICROSCOPIC
Bilirubin Urine: NEGATIVE
Glucose, UA: NEGATIVE mg/dL
HGB URINE DIPSTICK: NEGATIVE
Ketones, ur: NEGATIVE mg/dL
Leukocytes, UA: NEGATIVE
Nitrite: NEGATIVE
Protein, ur: NEGATIVE mg/dL
SPECIFIC GRAVITY, URINE: 1.02 (ref 1.005–1.030)
pH: 6.5 (ref 5.0–8.0)

## 2015-06-10 LAB — POCT FERN TEST
POCT FERN TEST: NEGATIVE
POCT Fern Test: NEGATIVE

## 2015-06-10 NOTE — MAU Note (Signed)
Pt states she has been having thin discharge for a couple of days.  Describes as slimy and stringy clear and odorous fluid.  Denies vaginal bleeding.  Good fetal movement.

## 2015-06-10 NOTE — MAU Provider Note (Signed)
History     CSN: [id number]  Arrival date and time: 06/10/15 1840   First Provider Initiated Contact with Patient 06/10/15 2038      Chief Complaint  Patient presents with  . Vaginal Discharge  . Rupture of Membranes   HPI 29yo G3P1011 at 37+1 presents with concern for leakage of fluid.  She felt some fluid earlier today.  She denies contractions, VB, or decreased FM.  Past Medical History  Diagnosis Date  . Asthma   . Mental disorder PTSD Depression Schizophrenic disorder,  anxiety  . Depression   . Deliberate self-cutting   . PTSD (post-traumatic stress disorder)   . Bipolar 1 disorder (HCC)   . Suicidal ideation     Past Surgical History  Procedure Laterality Date  . Foot surgery      Family History  Problem Relation Age of Onset  . Diabetes Other   . Depression Maternal Uncle   . Congenital heart disease Daughter     no surgical correction required at this time    Social History  Substance Use Topics  . Smoking status: Current Every Day Smoker -- 1.00 packs/day for 10 years    Types: Cigarettes  . Smokeless tobacco: Never Used  . Alcohol Use: Yes     Comment: wine once a month   LAST DRANK-   WINE   2 WEEKS AGO    Allergies: No Known Allergies  Prescriptions prior to admission  Medication Sig Dispense Refill Last Dose  . buPROPion (WELLBUTRIN XL) 150 MG 24 hr tablet Take 150 mg by mouth daily.   06/10/2015 at Unknown time  . Cimetidine (HEARTBURN RELIEF PO) Take 1 tablet by mouth 3 (three) times daily as needed (heartburn).   06/10/2015 at Unknown time  . lisdexamfetamine (VYVANSE) 50 MG capsule Take 50 mg by mouth daily. Reported on 04/23/2015   06/10/2015 at Unknown time  . Lurasidone HCl (LATUDA) 120 MG TABS Take 1 tablet by mouth at bedtime. Reported on 04/23/2015   06/09/2015 at Unknown time  . Prenatal Vit-Fe Fumarate-FA (PRENATAL MULTIVITAMIN) TABS tablet Take 1 tablet by mouth daily at 12 noon.   06/10/2015 at Unknown time  . sertraline (ZOLOFT) 100 MG  tablet Take 100 mg by mouth at bedtime.    06/09/2015 at Unknown time  . traZODone (DESYREL) 100 MG tablet Take 100 mg by mouth at bedtime.   06/09/2015 at Unknown time    ROS  No fevers/chills No chest pain/SOB No nausea/vomiting  Physical Exam   Blood pressure 129/82, pulse 98, temperature 97.8 F (36.6 C), temperature source Oral, resp. rate 18, height 5' 7.75" (1.721 m), weight 245 lb 12.8 oz (111.494 kg), last menstrual period 09/23/2014, SpO2 99 %, unknown if currently breastfeeding.  Physical Exam Gen: alert, in NAD Chest: normal WOB Abd: nontender GU: no vaginal pooling, scant mucousy discharge, negative ferning, moist vaginal mucosa without lesions Psych: cooperative, appropriate affect  Reactive NSTs, no contractions on toco  MAU Course  Procedures  MDM Patient here for r/o ROM and has no vaginal pooling and negative fern test.  No evidence of regular contractions.  Assessment and Plan  29yo G3P1011 at 37+1 here with concern for LOF.  -- No evidence of ROM or labor. -- Discharge home with strict labor precautions. -- Keep previously scheduled prenatal visits.  Caesar ChestnutKelly E Garcia 06/10/2015, 8:57 PM   I have participated in the care of this patient and I agree with the above. Cam HaiSHAW, Simpson Paulos CNM 11:36 AM 06/12/2015

## 2015-06-10 NOTE — Discharge Instructions (Signed)
Premature Rupture and Preterm Premature Rupture of Membranes °Premature rupture of membranes (PROM) is when the membranes (amniotic sac) break open before contractions or labor starts. Rupture of membranes is commonly referred to as your water breaking. If PROM occurs before 37 weeks of pregnancy, it is called preterm premature rupture of membranes (PPROM). The amniotic sac holds the fetus, keeps infection out, and performs other important functions. Having the amniotic sac rupture before 37 weeks of pregnancy can lead to serious problems and requires immediate attention by your health care provider. °CAUSES  °PROM near the end of the pregnancy may be caused by natural weakening of the membranes. PPROM is often due to an infection. Other factors that may be associated with PROM include: °· Stretching of the amniotic sac because of carrying multiples or having too much amniotic fluid. °· Trauma. °· Smoking during pregnancy. °· Poor nutrition. °· Previous preterm birth. °· Vaginal bleeding. °· Little to no prenatal care. °· Problems with the placenta, such as placenta previa or placental abruption. °RISKS OF PROM AND PPROM °· Delivering a premature baby. °· Getting a serious infection of the placental tissues (chorioamnionitis). °· Early detachment of the placenta from the uterus (placental abruption). °· Compression of the umbilical cord. °· Needing a cesarean birth. °· Developing a serious infection after delivery. °SIGNS OF PROM OR PPROM  °· A sudden gush or slow leaking of fluid from the vagina. °· Constant wet underwear. °Sometimes, women mistake the leaking or wetness for urine, especially if the leak is slow and not a gush of fluid. If there is constant leaking or your underwear continues to get wet, your membranes have likely ruptured. °WHAT TO DO IF YOU THINK YOUR MEMBRANES HAVE RUPTURED °Call your health care provider right away. You will need to go to the hospital to get checked immediately. °WHAT HAPPENS  IF YOU ARE DIAGNOSED WITH PROM OR PPROM? °Once you arrive at the hospital, you will have tests done. A cervical exam will be performed to check if the cervix has softened or started to open (dilate). If you are diagnosed with PROM, you may be induced within 24 hours if you are not having contractions. If you are diagnosed with PPROM and are not having contractions, you may be induced depending on your trimester.  °If you have PPROM, you: °· And your baby will be monitored closely for signs of infection or other complications. °· May be given an antibiotic medicine to lower the chances of an infection developing. °· May be given a steroid medicine to help mature the baby's lungs faster. °· May be given a medicine to stop preterm labor. °· May be ordered to be on bed rest at home or in the hospital. °· May be induced if complications arise for you or the baby. °Your treatment will depend on many factors, such as how far along you are, the development of the baby, and other complications that may arise. °  °This information is not intended to replace advice given to you by your health care provider. Make sure you discuss any questions you have with your health care provider. °  °Document Released: 01/24/2005 Document Revised: 11/14/2012 Document Reviewed: 05/15/2012 °Elsevier Interactive Patient Education ©2016 [employer] ° °

## 2015-06-11 ENCOUNTER — Ambulatory Visit (INDEPENDENT_AMBULATORY_CARE_PROVIDER_SITE_OTHER): Payer: Medicare Other | Admitting: Family

## 2015-06-11 VITALS — BP 132/87 | HR 84 | Temp 98.4°F | Wt 247.3 lb

## 2015-06-11 DIAGNOSIS — O0993 Supervision of high risk pregnancy, unspecified, third trimester: Secondary | ICD-10-CM

## 2015-06-11 DIAGNOSIS — O09891 Supervision of other high risk pregnancies, first trimester: Secondary | ICD-10-CM

## 2015-06-11 LAB — POCT URINALYSIS DIP (DEVICE)
Bilirubin Urine: NEGATIVE
Glucose, UA: NEGATIVE mg/dL
Hgb urine dipstick: NEGATIVE
KETONES UR: NEGATIVE mg/dL
Leukocytes, UA: NEGATIVE
Nitrite: NEGATIVE
PH: 7 (ref 5.0–8.0)
PROTEIN: NEGATIVE mg/dL
SPECIFIC GRAVITY, URINE: 1.02 (ref 1.005–1.030)
Urobilinogen, UA: 0.2 mg/dL (ref 0.0–1.0)

## 2015-06-11 NOTE — Progress Notes (Signed)
Subjective:  Nichole Lopez is a 30 y.o. G3P1011 at 3774w2d being seen today for ongoing prenatal care.  She is currently monitored for the following issues for this high-risk pregnancy and has Alcohol dependence, continuous (HCC); Substance-induced disorder (HCC); Post traumatic stress disorder (PTSD); Borderline personality disorder; Bipolar disorder, current episode manic w/o psychotic features, severe (HCC); Major depressive disorder, recurrent episode, severe, without mention of psychotic behavior; Supervision of high risk pregnancy, antepartum; Alcohol consumption during pregnancy in first trimester; Medication exposure during first trimester of pregnancy; Drug abuse during pregnancy; Social problems, antepartum; and Tobacco use disorder on her problem list.  Patient reports increased vaginal discharge.  No itching or odor.  Seen in MAU yesterday, neg amnisure.  Contractions: Irregular. Vag. Bleeding: Small.  Movement: Present. Denies leaking of fluid.   The following portions of the patient's history were reviewed and updated as appropriate: allergies, current medications, past family history, past medical history, past social history, past surgical history and problem list. Problem list updated.  Objective:   Filed Vitals:   06/11/15 0941  BP: 132/87  Pulse: 84  Temp: 98.4 F (36.9 C)  Weight: 247 lb 4.8 oz (112.175 kg)    Fetal Status: Fetal Heart Rate (bpm): 158 Fundal Height: 40 cm Movement: Present  Presentation: Vertex  General:  Alert, oriented and cooperative. Patient is in no acute distress.  Skin: Skin is warm and dry. No rash noted.   Cardiovascular: Normal heart rate noted  Respiratory: Normal respiratory effort, no problems with respiration noted  Abdomen: Soft, gravid, appropriate for gestational age. Pain/Pressure: Absent     Pelvic: Vag. Bleeding: Small     Cervical exam performed Dilation: 1.5 Effacement (%): 30 Station: -2; neg pooling neg ferning  Extremities:  Normal range of motion.     Mental Status: Normal mood and affect. Normal behavior. Normal judgment and thought content.   Urinalysis: Urine Protein: Negative Urine Glucose: Negative  Assessment and Plan:  Pregnancy: G3P1011 at 4674w2d  1. Medication exposure during first trimester of pregnancy - Reviewed growth ultrasound > 82%ile  2. Supervision of high risk pregnancy, antepartum, third trimester - Continue monitoring  3.  Vaginal Discharge - Neg fern  Term labor symptoms and general obstetric precautions including but not limited to vaginal bleeding, contractions, leaking of fluid and fetal movement were reviewed in detail with the patient. Please refer to After Visit Summary for other counseling recommendations.  Return in about 1 week (around 06/18/2015).   Eino FarberWalidah Kennith GainN Lopez, CNM

## 2015-06-11 NOTE — Progress Notes (Signed)
Pt would like to be check for dilation today.

## 2015-06-13 ENCOUNTER — Encounter (HOSPITAL_COMMUNITY): Payer: Self-pay | Admitting: *Deleted

## 2015-06-13 ENCOUNTER — Inpatient Hospital Stay (HOSPITAL_COMMUNITY): Payer: Medicare Other | Admitting: Anesthesiology

## 2015-06-13 ENCOUNTER — Inpatient Hospital Stay (HOSPITAL_COMMUNITY)
Admission: AD | Admit: 2015-06-13 | Discharge: 2015-06-15 | DRG: 775 | Disposition: A | Discharge status: Home / Self Care | Payer: Medicare Other | Source: Ambulatory Visit | Attending: Obstetrics & Gynecology | Admitting: Obstetrics & Gynecology

## 2015-06-13 DIAGNOSIS — Z833 Family history of diabetes mellitus: Secondary | ICD-10-CM

## 2015-06-13 DIAGNOSIS — O4292 Full-term premature rupture of membranes, unspecified as to length of time between rupture and onset of labor: Secondary | ICD-10-CM

## 2015-06-13 DIAGNOSIS — O99334 Smoking (tobacco) complicating childbirth: Secondary | ICD-10-CM | POA: Diagnosis present

## 2015-06-13 DIAGNOSIS — O0973 Supervision of high risk pregnancy due to social problems, third trimester: Secondary | ICD-10-CM

## 2015-06-13 DIAGNOSIS — Z8249 Family history of ischemic heart disease and other diseases of the circulatory system: Secondary | ICD-10-CM | POA: Diagnosis not present

## 2015-06-13 DIAGNOSIS — O99313 Alcohol use complicating pregnancy, third trimester: Secondary | ICD-10-CM | POA: Diagnosis not present

## 2015-06-13 DIAGNOSIS — Z9889 Other specified postprocedural states: Secondary | ICD-10-CM | POA: Diagnosis present

## 2015-06-13 DIAGNOSIS — O0993 Supervision of high risk pregnancy, unspecified, third trimester: Secondary | ICD-10-CM

## 2015-06-13 DIAGNOSIS — F1590 Other stimulant use, unspecified, uncomplicated: Secondary | ICD-10-CM | POA: Diagnosis present

## 2015-06-13 DIAGNOSIS — O09891 Supervision of other high risk pregnancies, first trimester: Secondary | ICD-10-CM

## 2015-06-13 DIAGNOSIS — O4202 Full-term premature rupture of membranes, onset of labor within 24 hours of rupture: Principal | ICD-10-CM | POA: Diagnosis present

## 2015-06-13 DIAGNOSIS — O99311 Alcohol use complicating pregnancy, first trimester: Secondary | ICD-10-CM

## 2015-06-13 DIAGNOSIS — F17210 Nicotine dependence, cigarettes, uncomplicated: Secondary | ICD-10-CM | POA: Diagnosis present

## 2015-06-13 DIAGNOSIS — O4290 Premature rupture of membranes, unspecified as to length of time between rupture and onset of labor, unspecified weeks of gestation: Secondary | ICD-10-CM | POA: Diagnosis present

## 2015-06-13 DIAGNOSIS — F1910 Other psychoactive substance abuse, uncomplicated: Secondary | ICD-10-CM

## 2015-06-13 DIAGNOSIS — F1290 Cannabis use, unspecified, uncomplicated: Secondary | ICD-10-CM | POA: Diagnosis present

## 2015-06-13 DIAGNOSIS — O99320 Drug use complicating pregnancy, unspecified trimester: Secondary | ICD-10-CM

## 2015-06-13 DIAGNOSIS — Z3A37 37 weeks gestation of pregnancy: Secondary | ICD-10-CM | POA: Diagnosis not present

## 2015-06-13 DIAGNOSIS — O99324 Drug use complicating childbirth: Secondary | ICD-10-CM | POA: Diagnosis present

## 2015-06-13 LAB — RAPID URINE DRUG SCREEN, HOSP PERFORMED
AMPHETAMINES: POSITIVE — AB
BARBITURATES: NOT DETECTED
BENZODIAZEPINES: NOT DETECTED
Cocaine: NOT DETECTED
Opiates: NOT DETECTED
Tetrahydrocannabinol: NOT DETECTED

## 2015-06-13 LAB — TYPE AND SCREEN
ABO/RH(D): A POS
ANTIBODY SCREEN: NEGATIVE

## 2015-06-13 LAB — URINALYSIS, ROUTINE W REFLEX MICROSCOPIC
BILIRUBIN URINE: NEGATIVE
Glucose, UA: NEGATIVE mg/dL
HGB URINE DIPSTICK: NEGATIVE
KETONES UR: NEGATIVE mg/dL
Leukocytes, UA: NEGATIVE
NITRITE: NEGATIVE
PROTEIN: NEGATIVE mg/dL
Specific Gravity, Urine: 1.02 (ref 1.005–1.030)
pH: 7.5 (ref 5.0–8.0)

## 2015-06-13 LAB — CBC
HEMATOCRIT: 36 % (ref 36.0–46.0)
Hemoglobin: 12.4 g/dL (ref 12.0–15.0)
MCH: 30 pg (ref 26.0–34.0)
MCHC: 34.4 g/dL (ref 30.0–36.0)
MCV: 87 fL (ref 78.0–100.0)
Platelets: 208 10*3/uL (ref 150–400)
RBC: 4.14 MIL/uL (ref 3.87–5.11)
RDW: 12.5 % (ref 11.5–15.5)
WBC: 9.6 10*3/uL (ref 4.0–10.5)

## 2015-06-13 LAB — POCT FERN TEST
POCT FERN TEST: NEGATIVE
POCT FERN TEST: POSITIVE

## 2015-06-13 MED ORDER — LACTATED RINGERS IV SOLN: 500.0000 mL | Freq: Once | INTRAVENOUS | Status: DC

## 2015-06-13 MED ORDER — LACTATED RINGERS IV SOLN: 500.0000 mL | INTRAVENOUS | Status: DC | PRN

## 2015-06-13 MED ORDER — PHENYLEPHRINE 40 MCG/ML (10ML) SYRINGE FOR IV PUSH (FOR BLOOD PRESSURE SUPPORT)
80.0000 ug | PREFILLED_SYRINGE | INTRAVENOUS | Status: DC | PRN
  Filled 2015-06-13: qty 5

## 2015-06-13 MED ORDER — OXYTOCIN 10 UNIT/ML IJ SOLN
2.5000 [IU]/h | INTRAVENOUS | Status: DC
  Administered 2015-06-13: 23:00:00 via INTRAVENOUS

## 2015-06-13 MED ORDER — OXYCODONE-ACETAMINOPHEN 5-325 MG PO TABS: 2.0000 | ORAL_TABLET | ORAL | Status: DC | PRN

## 2015-06-13 MED ORDER — LIDOCAINE HCL (PF) 1 % IJ SOLN
30.0000 mL | INTRAMUSCULAR | Status: DC | PRN
  Filled 2015-06-13: qty 30

## 2015-06-13 MED ORDER — OXYTOCIN BOLUS FROM INFUSION: 500.0000 mL | INTRAVENOUS | Status: DC

## 2015-06-13 MED ORDER — ONDANSETRON HCL 4 MG/2ML IJ SOLN: 4.0000 mg | Freq: Four times a day (QID) | INTRAMUSCULAR | Status: DC | PRN

## 2015-06-13 MED ORDER — PHENYLEPHRINE 40 MCG/ML (10ML) SYRINGE FOR IV PUSH (FOR BLOOD PRESSURE SUPPORT)
80.0000 ug | PREFILLED_SYRINGE | INTRAVENOUS | Status: DC | PRN
  Filled 2015-06-13: qty 5
  Filled 2015-06-13: qty 10

## 2015-06-13 MED ORDER — OXYCODONE-ACETAMINOPHEN 5-325 MG PO TABS: 1.0000 | ORAL_TABLET | ORAL | Status: DC | PRN

## 2015-06-13 MED ORDER — OXYTOCIN 10 UNIT/ML IJ SOLN
1.0000 m[IU]/min | INTRAMUSCULAR | Status: DC
  Administered 2015-06-13: 2 m[IU]/min via INTRAVENOUS
  Filled 2015-06-13: qty 4

## 2015-06-13 MED ORDER — LACTATED RINGERS IV SOLN
INTRAVENOUS | Status: DC
  Administered 2015-06-13: 14:00:00 via INTRAVENOUS

## 2015-06-13 MED ORDER — FENTANYL 2.5 MCG/ML BUPIVACAINE 1/10 % EPIDURAL INFUSION (WH - ANES)
14.0000 mL/h | INTRAMUSCULAR | Status: DC | PRN
  Administered 2015-06-13 (×2): 14 mL/h via EPIDURAL
  Filled 2015-06-13: qty 125

## 2015-06-13 MED ORDER — LIDOCAINE HCL (PF) 1 % IJ SOLN
INTRAMUSCULAR | Status: DC | PRN
  Administered 2015-06-13: 5 mL via EPIDURAL
  Administered 2015-06-13: 3 mL via EPIDURAL
  Administered 2015-06-13: 2 mL via EPIDURAL

## 2015-06-13 MED ORDER — LACTATED RINGERS IV SOLN
500.0000 mL | Freq: Once | INTRAVENOUS | Status: AC
  Administered 2015-06-13: 500 mL via INTRAVENOUS

## 2015-06-13 MED ORDER — DIPHENHYDRAMINE HCL 50 MG/ML IJ SOLN: 12.5000 mg | INTRAMUSCULAR | Status: DC | PRN

## 2015-06-13 MED ORDER — CITRIC ACID-SODIUM CITRATE 334-500 MG/5ML PO SOLN
30.0000 mL | ORAL | Status: DC | PRN
Start: 2015-06-13 — End: 2015-06-14

## 2015-06-13 MED ORDER — TERBUTALINE SULFATE 1 MG/ML IJ SOLN
0.2500 mg | Freq: Once | INTRAMUSCULAR | Status: DC | PRN
  Filled 2015-06-13: qty 1

## 2015-06-13 MED ORDER — ACETAMINOPHEN 325 MG PO TABS: 650.0000 mg | ORAL_TABLET | ORAL | Status: DC | PRN

## 2015-06-13 MED ORDER — EPHEDRINE 5 MG/ML INJ
10.0000 mg | INTRAVENOUS | Status: DC | PRN
  Filled 2015-06-13: qty 2

## 2015-06-13 NOTE — Anesthesia Procedure Notes (Signed)
Epidural Patient location during procedure: OB  Staffing Anesthesiologist: Chasten Blaze EDWARD Performed by: anesthesiologist   Preanesthetic Checklist Completed: patient identified, pre-op evaluation, timeout performed, IV checked, risks and benefits discussed and monitors and equipment checked  Epidural Patient position: sitting Prep: DuraPrep Patient monitoring: blood pressure and continuous pulse ox Approach: midline Location: L3-L4 Injection technique: LOR air  Needle:  Needle type: Tuohy  Needle gauge: 17 G Needle length: 9 cm Needle insertion depth: 5 cm Catheter size: 19 Gauge Catheter at skin depth: 10 cm Test dose: negative and Other (1% Lidocaine)  Additional Notes Patient identified.  Risk benefits discussed including failed block, incomplete pain control, headache, nerve damage, paralysis, blood pressure changes, nausea, vomiting, reactions to medication both toxic or allergic, and postpartum back pain.  Patient expressed understanding and wished to proceed.  All questions were answered.  Sterile technique used throughout procedure and epidural site dressed with sterile barrier dressing. No paresthesia or other complications noted. The patient did not experience any signs of intravascular injection such as tinnitus or metallic taste in mouth nor signs of intrathecal spread such as rapid motor block. Please see nursing notes for vital signs. Reason for block:procedure for pain   

## 2015-06-13 NOTE — H&P (Signed)
Nichole Lopez is a 30 y.o. female presenting for leaking of fluid since 0400   Has some mild contractions.   Pregnancy has been followed in the HR clinic and remarkable for amphetamine use (pt states is prescribed Vyvanse) Also has hx of alcohol and substance use, PTSD, borderline personality disorder, and bipolar disorder.   RN Note: .Pt states woke up this morning around 0940 and felt wet between legs. Pt states fluid feels watery and warm. Pt states there is a red tinge to the fluid.  Maternal Medical History:  Reason for admission: Nausea.    OB History    Gravida Para Term Preterm AB TAB SAB Ectopic Multiple Living   0 1     Past Medical History  Diagnosis Date  . Asthma   . Mental disorder PTSD Depression Schizophrenic disorder,  anxiety  . Depression   . Deliberate self-cutting   . PTSD (post-traumatic stress disorder)   . Bipolar 1 disorder (HCC)   . Suicidal ideation    Past Surgical History  Procedure Laterality Date  . Foot surgery     Family History: family history includes Congenital heart disease in her daughter; Depression in her maternal uncle; Diabetes in her other. Social History:  reports that she has been smoking Cigarettes.  She has a 10 pack-year smoking history. She has never used smokeless tobacco. She reports that she drinks alcohol. She reports that she uses illicit drugs (Marijuana).   Prenatal Transfer Tool  Maternal Diabetes: No Genetic Screening: Normal Maternal Ultrasounds/Referrals: Normal Fetal Ultrasounds or other Referrals:  None Maternal Substance Abuse:  Yes:  Type: Other:  ?amphetamine (prescribed per pt report) Significant Maternal Medications:  Meds include: Other:  Significant Maternal Lab Results:  Lab values include: Group B Strep negative Other Comments:  None  Review of Systems  Constitutional: Negative for fever, chills and malaise/fatigue.  Respiratory: Negative for shortness of breath.    Gastrointestinal: Positive for abdominal pain (mild contractions). Negative for nausea, vomiting, diarrhea and constipation.  Genitourinary: Negative for dysuria.  Musculoskeletal: Negative for myalgias.  Neurological: Negative for dizziness.    Dilation: 3 Effacement (%): 80 Station: -2 Exam by:: Mayford Knife, CNM Blood pressure 123/72, pulse 88, temperature 98.1 F (36.7 C), temperature source Oral, resp. rate 18, last menstrual period 09/23/2014, unknown if currently breastfeeding. Maternal Exam:  Uterine Assessment: Contraction strength is moderate.  Contraction frequency is irregular.   Abdomen: Patient reports no abdominal tenderness. Fundal height is 39.   Estimated fetal weight is 7.   Fetal presentation: vertex  Introitus: Normal vulva. Vagina is positive for vaginal discharge.  Ferning test: positive.  Nitrazine test: not done. Amniotic fluid character: clear.  Pelvis: adequate for delivery.   Cervix: Cervix evaluated by digital exam.     Fetal Exam Fetal Monitor Review: Mode: ultrasound.   Baseline rate: 140.  Variability: moderate (6-25 bpm).   Pattern: accelerations present and no decelerations.    Fetal State Assessment: Category I - tracings are normal.     Physical Exam  Constitutional: She is oriented to person, place, and time. She appears well-developed and well-nourished. No distress.  HENT:  Head: Normocephalic.  Cardiovascular: Normal rate and regular rhythm.  Exam reveals no gallop and no friction rub.   No murmur heard. Respiratory: Effort normal and breath sounds normal. No respiratory distress.  GI: Soft. She exhibits no distension. There is no tenderness. There is no rebound and no guarding.  Genitourinary: Vaginal discharge  found.  Dilation: 3 Effacement (%): 80 Cervical Position: Posterior Station: -2 Presentation: Vertex Exam by:: Mayford KnifeWilliams, CNM   Musculoskeletal: Normal range of motion.  Neurological: She is alert and oriented to  person, place, and time.  Skin: Skin is warm and dry.  Psychiatric: She has a normal mood and affect.    Prenatal labs: ABO, Rh: --/--/A POS (09/27 1358) Antibody:   Rubella:   RPR: NON REAC (03/16 1032)  HBsAg:    HIV: NONREACTIVE (03/16 1032)  GBS:     Assessment/Plan: SIUP at 5256w4d PROM at term Early labor, progressively getting stronger  [facility] orders Anticipate increased labor  [facility] HospitalWILLIAMS,Mort Smelser 06/13/2015, 1:35 PM

## 2015-06-13 NOTE — MAU Note (Signed)
Pt states woke up this morning around 0940 and felt wet between legs.  Pt states fluid feels watery and warm.  Pt states there is a red tinge to the fluid.

## 2015-06-13 NOTE — Anesthesia Preprocedure Evaluation (Signed)
Anesthesia Evaluation  Patient identified by MRN, date of birth, ID band Patient awake    Reviewed: Allergy & Precautions, NPO status , Patient's Chart, lab work & pertinent test results  Airway Mallampati: II  TM Distance: >3 FB Neck ROM: Full    Dental  (+) Teeth Intact, Dental Advisory Given   Pulmonary asthma , Current Smoker,    Pulmonary exam normal breath sounds clear to auscultation       Cardiovascular negative cardio ROS Normal cardiovascular exam Rhythm:Regular Rate:Normal     Neuro/Psych PSYCHIATRIC DISORDERS Anxiety Depression Bipolar Disorder negative neurological ROS     GI/Hepatic negative GI ROS, (+)     substance abuse  alcohol use and marijuana use,   Endo/Other  Obesity   Renal/GU negative Renal ROS     Musculoskeletal negative musculoskeletal ROS (+)   Abdominal   Peds  Hematology negative hematology ROS (+) Plt 208k   Anesthesia Other Findings Day of surgery medications reviewed with the patient.  Reproductive/Obstetrics (+) Pregnancy                             Anesthesia Physical Anesthesia Plan  ASA: III  Anesthesia Plan: Epidural   Post-op Pain Management:    Induction:   Airway Management Planned:   Additional Equipment:   Intra-op Plan:   Post-operative Plan:   Informed Consent: I have reviewed the patients History and Physical, chart, labs and discussed the procedure including the risks, benefits and alternatives for the proposed anesthesia with the patient or authorized representative who has indicated his/her understanding and acceptance.   Dental advisory given  Plan Discussed with:   Anesthesia Plan Comments: (Patient identified. Risks/Benefits/Options discussed with patient including but not limited to bleeding, infection, nerve damage, paralysis, failed block, incomplete pain control, headache, blood pressure changes, nausea, vomiting,  reactions to medication both or allergic, itching and postpartum back pain. Confirmed with bedside nurse the patient's most recent platelet count. Confirmed with patient that they are not currently taking any anticoagulation, have any bleeding history or any family history of bleeding disorders. Patient expressed understanding and wished to proceed. All questions were answered. )        Anesthesia Quick Evaluation

## 2015-06-13 NOTE — Progress Notes (Signed)
Nichole Lopez is a 30 y.o. G3P1011 at 507w4d admitted for PROM  Subjective: Pt comfortable with epidural, feeling some rectal pressure with contractions.  Objective: BP 124/87 mmHg  Pulse 73  Temp(Src) 98 F (36.7 C) (Oral)  Resp 18  Ht 5' 7.75" (1.721 m)  Wt 112.038 kg (247 lb)  BMI 37.83 kg/m2  SpO2 100%  LMP 09/23/2014      FHT:  FHR: 135 bpm, variability: moderate,  accelerations:  Present,  decelerations:  Absent UC:   regular, every 2-3 minutes SVE:   Dilation: 7 Effacement (%): 100 Station: 0 Exam by:: Misty StanleyLisa, CNM   Labs: Lab Results  Component Value Date   WBC 9.6 06/13/2015   HGB 12.4 06/13/2015   HCT 36.0 06/13/2015   MCV 87.0 06/13/2015   PLT 208 06/13/2015    Assessment / Plan: Augmentation of labor, progressing well  Labor: Progressing normally Preeclampsia:  n/a Fetal Wellbeing:  Category I Pain Control:  Epidural I/D:  n/a Anticipated MOD:  NSVD  LEFTWICH-KIRBY, Bannon Giammarco 06/13/2015, 10:35 PM

## 2015-06-13 NOTE — Anesthesia Pain Management Evaluation Note (Signed)
  CRNA Pain Management Visit Note  Patient: Nichole Lopez, 30 y.o., female  "Hello [facility]. We have an anesthesia team available at all times to provide care throughout the hospital, including epidural management and anesthesia for C-section. I don't know your plan for the delivery whether it a natural birth, water birth, IV sedation, nitrous supplementation, doula or epidural, but we want to meet your pain goals."   1.Was your pain managed to your expectations on prior hospitalizations?   yes  2.What is your expectation for pain management during this hospitalization?     Epidural working  3.How can we help you reach that goal? Continue to monitor  Record the patient's initial score and the patient's pain goal. 2/10  Pain: 1 (1/10)  Pain Goal: 2/10 [facility] wants you to be able to say your pain was always managed very well.  Nichole Lopez, Nichole Lopez 06/13/2015

## 2015-06-14 ENCOUNTER — Encounter (HOSPITAL_COMMUNITY): Payer: Self-pay

## 2015-06-14 DIAGNOSIS — F1290 Cannabis use, unspecified, uncomplicated: Secondary | ICD-10-CM

## 2015-06-14 DIAGNOSIS — O4202 Full-term premature rupture of membranes, onset of labor within 24 hours of rupture: Secondary | ICD-10-CM

## 2015-06-14 DIAGNOSIS — O99324 Drug use complicating childbirth: Secondary | ICD-10-CM

## 2015-06-14 DIAGNOSIS — Z3A37 37 weeks gestation of pregnancy: Secondary | ICD-10-CM

## 2015-06-14 DIAGNOSIS — O99313 Alcohol use complicating pregnancy, third trimester: Secondary | ICD-10-CM

## 2015-06-14 LAB — RPR: RPR: NONREACTIVE

## 2015-06-14 LAB — HIV ANTIBODY (ROUTINE TESTING W REFLEX): HIV Screen 4th Generation wRfx: NONREACTIVE

## 2015-06-14 MED ORDER — ONDANSETRON HCL 4 MG/2ML IJ SOLN: 4.0000 mg | INTRAMUSCULAR | Status: DC | PRN

## 2015-06-14 MED ORDER — WITCH HAZEL-GLYCERIN EX PADS: 1.0000 "application " | MEDICATED_PAD | CUTANEOUS | Status: DC | PRN

## 2015-06-14 MED ORDER — SENNOSIDES-DOCUSATE SODIUM 8.6-50 MG PO TABS
2.0000 | ORAL_TABLET | ORAL | Status: DC
  Administered 2015-06-14: 2 via ORAL
  Filled 2015-06-14: qty 2

## 2015-06-14 MED ORDER — ACETAMINOPHEN 325 MG PO TABS: 650.0000 mg | ORAL_TABLET | ORAL | Status: DC | PRN

## 2015-06-14 MED ORDER — IBUPROFEN 600 MG PO TABS
600.0000 mg | ORAL_TABLET | Freq: Four times a day (QID) | ORAL | Status: DC
  Administered 2015-06-14 – 2015-06-15 (×5): 600 mg via ORAL
  Filled 2015-06-14 (×6): qty 1

## 2015-06-14 MED ORDER — COCONUT OIL OIL: 1.0000 "application " | TOPICAL_OIL | Status: DC | PRN

## 2015-06-14 MED ORDER — DIPHENHYDRAMINE HCL 25 MG PO CAPS: 25.0000 mg | ORAL_CAPSULE | Freq: Four times a day (QID) | ORAL | Status: DC | PRN

## 2015-06-14 MED ORDER — TETANUS-DIPHTH-ACELL PERTUSSIS 5-2.5-18.5 LF-MCG/0.5 IM SUSP: 0.5000 mL | Freq: Once | INTRAMUSCULAR | Status: DC

## 2015-06-14 MED ORDER — ZOLPIDEM TARTRATE 5 MG PO TABS: 5.0000 mg | ORAL_TABLET | Freq: Every evening | ORAL | Status: DC | PRN

## 2015-06-14 MED ORDER — PRENATAL MULTIVITAMIN CH
1.0000 | ORAL_TABLET | Freq: Every day | ORAL | Status: DC
  Administered 2015-06-14 – 2015-06-15 (×2): 1 via ORAL
  Filled 2015-06-14 (×3): qty 1

## 2015-06-14 MED ORDER — LURASIDONE HCL 40 MG PO TABS
120.0000 mg | ORAL_TABLET | Freq: Every day | ORAL | Status: DC
  Filled 2015-06-14 (×2): qty 3

## 2015-06-14 MED ORDER — SIMETHICONE 80 MG PO CHEW
80.0000 mg | CHEWABLE_TABLET | ORAL | Status: DC | PRN
  Administered 2015-06-14: 80 mg via ORAL
  Filled 2015-06-14: qty 1

## 2015-06-14 MED ORDER — SERTRALINE HCL 100 MG PO TABS
100.0000 mg | ORAL_TABLET | Freq: Every day | ORAL | Status: DC
  Administered 2015-06-14 (×2): 100 mg via ORAL
  Filled 2015-06-14 (×3): qty 1

## 2015-06-14 MED ORDER — BUPROPION HCL ER (XL) 150 MG PO TB24
150.0000 mg | ORAL_TABLET | Freq: Every day | ORAL | Status: DC
  Administered 2015-06-14 – 2015-06-15 (×2): 150 mg via ORAL
  Filled 2015-06-14 (×4): qty 1

## 2015-06-14 MED ORDER — BENZOCAINE-MENTHOL 20-0.5 % EX AERO
1.0000 "application " | INHALATION_SPRAY | CUTANEOUS | Status: DC | PRN
  Administered 2015-06-15: 1 via TOPICAL
  Filled 2015-06-14: qty 56

## 2015-06-14 MED ORDER — LISDEXAMFETAMINE DIMESYLATE 50 MG PO CAPS
50.0000 mg | ORAL_CAPSULE | ORAL | Status: DC
  Administered 2015-06-14 – 2015-06-15 (×2): 50 mg via ORAL
  Filled 2015-06-14 (×2): qty 1

## 2015-06-14 MED ORDER — TRAZODONE HCL 100 MG PO TABS
100.0000 mg | ORAL_TABLET | Freq: Every day | ORAL | Status: DC
  Administered 2015-06-14: 100 mg via ORAL
  Filled 2015-06-14 (×2): qty 1

## 2015-06-14 MED ORDER — DIBUCAINE 1 % RE OINT: 1.0000 "application " | TOPICAL_OINTMENT | RECTAL | Status: DC | PRN

## 2015-06-14 MED ORDER — ONDANSETRON HCL 4 MG PO TABS: 4.0000 mg | ORAL_TABLET | ORAL | Status: DC | PRN

## 2015-06-14 NOTE — Clinical Social Work Maternal (Signed)
CLINICAL SOCIAL WORK MATERNAL/CHILD NOTE  Patient Details  Name: Nichole Lopez MRN: [id number] Date of Birth: 06/13/2013  Date:  06/14/2015  Clinical Social Worker Initiating Note:  Arpi Diebold, LCSW Date/ Time Initiated:  06/14/15/1602     Child's Name:  Nichole Lopez   Legal Guardian:   (to be determined by CPS)   Need for Interpreter:  None   Date of Referral:  06/13/15     Reason for Referral:  Current Substance Use/Substance Use During Pregnancy , Behavioral Health Issues, including SI , Other (Comment) (DSS involvement in past)   Referral Source:  RN   Address:  [address] [place] [address]  Phone number:  [phone]   Household Members:  Self   Natural Supports (not living in the home):   (MOB sister- Christine)   Professional Supports: Other (Comment) ([employer] mentor program- Monika Gauthier)   Employment: Disabled   Type of Work:     Education:  9 to 11 years   Financial Resources:  SSI/Disability   Other Resources:  Food Stamps , Public Housing  (appt with WIC this week)   Cultural/Religious Considerations Which May Impact Care:  none reported by MOB  Strengths:  Ability to meet basic needs , Home prepared for child    Risk Factors/Current Problems:  DHHS Involvement , Mental Health Concerns , Substance Use    Cognitive State:  Able to Concentrate    Mood/Affect:  Sad , Apprehensive    CSW Assessment: CSW met with MOB who was holding baby during assessment. She had also requested to see CSW due to concerns regarding DSS involvement. MOB reports she lives alone and her best support is her sister, Christine who lives in [place]. She is also very involved with the [employer] mentor program and indicates that Monika was here earlier today and sees pt weekly for support and parenting skills. MOB shares that FOB is not involved at all and she is relieved about this. She is concerned because she describes a difficult history with CPS and recently  gave up her daughter for adoption in March. She feels that she was not going to convince DSS that she has changed and was tired of fighting. However, she feels that she has made a lot of progress and very much wants to parent this baby. MOB said that DSS was not seeing progress and her first baby was put in foster care due to substance abuse and neglect. CSW explained protocol to notify DSS due to very recent history of involvement and loss of custody. Although she reports understanding, MOB was clearly upset about this. Support provided. RN reports no concerns currently regarding MOB and bonding.   MOB admits to drinking 1-2 glasses of wine occasionally during pregnancy. She also said that she used marijuana "once in a blue moon," but none in the past 7 weeks. She is aware of newborn drug screening process and is confident that baby will only be positive for amphetamines since she has a prescription for Vyvanse.   CSW discussed extensive mental health history. MOB reports she is aware she has "a lot of diagnoses" but only feels PTSD and depression are accurate. She states she sees a therapist weekly at [facility] and has recently changed psychiatrists, so is unsure of name, but said she has an appointment in June. MOB denies any cutting, SI, or inpatient treatment in past several years. She admits to being depressed, but feels that issues with DSS and losing custody of first child   contributed to this.   CSW Plan/Description:  Child Protective Service Report - awaiting return call to complete. MOB notified of report.     Shyra Emile Shanaberger, LCSW 06/14/2015, 4:08 PM [phone] 

## 2015-06-14 NOTE — Progress Notes (Signed)
Post Partum Day 1 Subjective: no complaints, up ad lib, voiding, tolerating PO and + flatus  Objective: Blood pressure 98/56, pulse 72, temperature 97.9 F (36.6 C), temperature source Oral, resp. rate 18, height 5' 7.75" (1.721 m), weight 112.038 kg (247 lb), last menstrual period 09/23/2014, SpO2 100 %, unknown if currently breastfeeding.  Physical Exam:  General: alert, cooperative and no distress Lochia: appropriate Uterine Fundus: firm Incision: n/a DVT Evaluation: No evidence of DVT seen on physical exam. Negative Homan's sign. No cords or calf tenderness. No significant calf/ankle edema.   Recent Labs  06/13/15 1410  HGB 12.4  HCT 36.0    Assessment/Plan: Plan for discharge tomorrow and Contraception undecided, considering IUD   LOS: 1 day   LEFTWICH-KIRBY, LISA 06/14/2015, 7:00 AM

## 2015-06-14 NOTE — Clinical Social Work Note (Signed)
CPS report made to Charles Key with [place] DSS. Per Charles, report has been accepted and will be follow up on within 24 hours. Baby is not to d/c home with MOB prior to DSS assessment.  Danika Kluender, LCSW [phone] 

## 2015-06-14 NOTE — Anesthesia Postprocedure Evaluation (Signed)
Anesthesia Post Note  Patient: Nichole Lopez  Procedure(s) Performed: * No procedures listed *  Patient location during evaluation: Mother Baby Anesthesia Type: Regional Level of consciousness: awake and alert and oriented Pain management: satisfactory to patient Vital Signs Assessment: post-procedure vital signs reviewed and stable Respiratory status: nonlabored ventilation and spontaneous breathing Cardiovascular status: stable Postop Assessment: no headache, no backache, no signs of nausea or vomiting and adequate PO intake Anesthetic complications: no     Last Vitals:  Filed Vitals:   06/14/15 0209 06/14/15 0556  BP: 106/58 98/56  Pulse: 81 72  Temp: 36.6 C 36.6 C  Resp: 18 18    Last Pain:  Filed Vitals:   06/14/15 0558  PainSc: 5    Pain Goal:                 Madison HickmanGREGORY,Shruti Arrey

## 2015-06-15 MED ORDER — IBUPROFEN 600 MG PO TABS: 600.0000 mg | ORAL_TABLET | Freq: Four times a day (QID) | ORAL | Status: DC

## 2015-06-15 NOTE — Progress Notes (Signed)
UR chart review completed.  

## 2015-06-15 NOTE — Progress Notes (Signed)
Patient retuned from TDM meeting  Crying and screaming in the hallway. Patient was taken to the room in attempt to calm her.She just kept saying "my baby, my baby, my baby". She grabbed her bags after calming (so slightly) and said she just had to leave. Patient was too upset to understand any follow-up instructions.I placed her AVS in her Bag. Marcelino DusterMichelle RN returned to her from pharmacy her Vyvanse tablets. Monica, a case worker from [facility] came outside with me to talk to her. After the talk from BonesteelMonica, the patient just walked off. Maxine GlennMonica said that she would be reconnected with Mobile Crisis this evening after they locate her.The baby remains [facility] [address] [facility].

## 2015-06-15 NOTE — Progress Notes (Signed)
Ms. Nichole Lopez was calm and cooperative during my shift. She showed readiness to learn about home care for baby. She was anxious about CDS involvement and the future outcome for [name]. She was appropriate regarding taking care of baby and very attentive to his needs.

## 2015-06-15 NOTE — Discharge Summary (Signed)
OB Discharge Summary  Patient Name: Nichole Lopez DOB: August 07, 1985 MRN: [id number]  Date of admission: 06/13/2015 Delivering MD: Sharen CounterLEFTWICH-KIRBY, LISA A   Date of discharge: 06/15/2015  Admitting diagnosis: 37WKS WATER BROKE  Intrauterine pregnancy: 7347w4d     Secondary diagnosis:Active Problems:   PROM (premature rupture of membranes)   NSVD (normal spontaneous vaginal delivery)  Additional problems:none     Discharge diagnosis: Term Pregnancy Delivered                                                                     Post partum procedures:none  Augmentation: Pitocin  Complications: None  Hospital course:  Onset of Labor With Vaginal Delivery     30 y.o. yo [id number] at 1047w4d was admitted in Active Labor on 06/13/2015. Patient had an uncomplicated labor course as follows:  Membrane Rupture Time/Date: 9:40 AM ,06/13/2015   Intrapartum Procedures: Episiotomy: None [1]                                         Lacerations:  None [1]  Patient had a delivery of a Viable infant. 06/13/2015  Information for the patient's newborn:  Vaughan BastaWilliams, Boy Sharanda [[id number]][[id number]]  Delivery Method: Vaginal, Spontaneous Delivery (Filed from Delivery Summary)    Pateint had an uncomplicated postpartum course.  She is ambulating, tolerating a regular diet, passing flatus, and urinating well. Patient is discharged home in stable condition on 06/15/2015.    Physical exam  Filed Vitals:   06/14/15 0556 06/14/15 0815 06/14/15 1455 06/14/15 1835  BP: 98/56  130/88 133/86  Pulse: 72  90 90  Temp: 97.9 F (36.6 C) 97 F (36.1 C) 98.6 F (37 C) 98 F (36.7 C)  TempSrc: Oral Axillary Oral Oral  Resp: 18  18 20   Height:      Weight:      SpO2: 100%      General: alert, cooperative and no distress Lochia: appropriate Uterine Fundus: firm Incision: N/A DVT Evaluation: Negative Homan's sign. No cords or calf tenderness. No significant calf/ankle edema. Labs: Lab Results  Component Value Date    WBC 9.6 06/13/2015   HGB 12.4 06/13/2015   HCT 36.0 06/13/2015   MCV 87.0 06/13/2015   PLT 208 06/13/2015   CMP Latest Ref Rng 04/10/2015  Glucose 65 - 99 mg/dL 696(E122(H)  BUN 6 - 20 mg/dL 7  Creatinine 9.520.44 - 8.411.00 mg/dL 3.240.67  Sodium 401135 - 027145 mmol/L 135  Potassium 3.5 - 5.1 mmol/L 3.5  Chloride 101 - 111 mmol/L 107  CO2 22 - 32 mmol/L 19(L)  Calcium 8.9 - 10.3 mg/dL 9.0  Total Protein 6.5 - 8.1 g/dL 6.9  Total Bilirubin 0.3 - 1.2 mg/dL 0.4  Alkaline Phos 38 - 126 U/L 59  AST 15 - 41 U/L 18  ALT 14 - 54 U/L 9(L)    Discharge instruction: per After Visit Summary and "Baby and Me Booklet".  After Visit Meds:    Medication List    ASK your doctor about these medications        buPROPion 150 MG 24 hr tablet  Commonly known as:  WELLBUTRIN  XL  Take 150 mg by mouth daily.     HEARTBURN RELIEF PO  Take 1 tablet by mouth 3 (three) times daily as needed (heartburn).     LATUDA 120 MG Tabs  Generic drug:  Lurasidone HCl  Take 1 tablet by mouth at bedtime. Reported on 04/23/2015     lisdexamfetamine 50 MG capsule  Commonly known as:  VYVANSE  Take 50 mg by mouth daily. Reported on 04/23/2015     prenatal multivitamin Tabs tablet  Take 1 tablet by mouth daily at 12 noon.     sertraline 100 MG tablet  Commonly known as:  ZOLOFT  Take 100 mg by mouth at bedtime.     traZODone 100 MG tablet  Commonly known as:  DESYREL  Take 100 mg by mouth at bedtime.        Diet: routine diet  Activity: Advance as tolerated. Pelvic rest for 6 weeks.   Outpatient follow up:6 weeks Follow up Appt:Future Appointments Date Time Provider Department Center  06/18/2015 9:30 AM Adam Phenix, MD [facility]   Follow up visit: No Follow-up on file.  Postpartum contraception: Condoms  Newborn Data: Live born female  Birth Weight: 7 lb 13.6 oz (3560 g) APGAR: 6, 9  Baby Feeding: Breast Disposition:home with mother   06/15/2015 Wyvonnia Dusky, CNM

## 2015-06-15 NOTE — Care Management Important Message (Signed)
Important Message  Patient Details  Name: Nichole Lopez MRN: [id number] Date of Birth: 04-28-1985   Medicare Important Message Given:  Yes    Renie OraHawkins, Fishel Wamble Smith 06/15/2015, 10:50 AMImportant Message  Patient Details  Name: Nichole Lopez MRN: [id number] Date of Birth: 04-28-1985   Medicare Important Message Given:  Yes    Renie OraHawkins, Trystian Crisanto Smith 06/15/2015, 10:49 AM

## 2015-06-15 NOTE — Plan of Care (Signed)
Problem: Coping: Goal: Ability to cope will improve Outcome: Progressing Patient having social work consult due to multiple meds during pregnancy for Bipolar disorder , PTSD, and depression.Patient has a [employer] mentor and CPS consult is in Progress.

## 2015-06-18 ENCOUNTER — Encounter: Payer: Self-pay | Admitting: Obstetrics & Gynecology

## 2015-07-27 ENCOUNTER — Ambulatory Visit: Payer: Self-pay | Admitting: Medical

## 2015-07-27 ENCOUNTER — Telehealth: Payer: Self-pay

## 2015-07-27 NOTE — Telephone Encounter (Signed)
Called pt to infrom her of missed post partum appt. Pt did not answer nor have a voicemail box.

## 2015-08-19 ENCOUNTER — Encounter (HOSPITAL_COMMUNITY): Payer: Self-pay | Admitting: Emergency Medicine

## 2015-08-19 ENCOUNTER — Emergency Department (HOSPITAL_COMMUNITY)
Admission: EM | Admit: 2015-08-19 | Discharge: 2015-08-20 | Disposition: A | Discharge status: Home / Self Care | Payer: Medicare Other | Attending: Emergency Medicine | Admitting: Emergency Medicine

## 2015-08-19 DIAGNOSIS — Z79899 Other long term (current) drug therapy: Secondary | ICD-10-CM | POA: Insufficient documentation

## 2015-08-19 DIAGNOSIS — F330 Major depressive disorder, recurrent, mild: Secondary | ICD-10-CM

## 2015-08-19 DIAGNOSIS — Z760 Encounter for issue of repeat prescription: Secondary | ICD-10-CM | POA: Diagnosis present

## 2015-08-19 DIAGNOSIS — F17210 Nicotine dependence, cigarettes, uncomplicated: Secondary | ICD-10-CM | POA: Insufficient documentation

## 2015-08-19 DIAGNOSIS — F329 Major depressive disorder, single episode, unspecified: Secondary | ICD-10-CM

## 2015-08-19 DIAGNOSIS — Z91148 Patient's other noncompliance with medication regimen for other reason: Secondary | ICD-10-CM

## 2015-08-19 DIAGNOSIS — F603 Borderline personality disorder: Secondary | ICD-10-CM | POA: Diagnosis not present

## 2015-08-19 DIAGNOSIS — Z9114 Patient's other noncompliance with medication regimen: Secondary | ICD-10-CM

## 2015-08-19 DIAGNOSIS — F32A Depression, unspecified: Secondary | ICD-10-CM

## 2015-08-19 DIAGNOSIS — J45909 Unspecified asthma, uncomplicated: Secondary | ICD-10-CM | POA: Insufficient documentation

## 2015-08-19 LAB — COMPREHENSIVE METABOLIC PANEL
ALBUMIN: 4.1 g/dL (ref 3.5–5.0)
ALT: 18 U/L (ref 14–54)
ANION GAP: 6 (ref 5–15)
AST: 17 U/L (ref 15–41)
Alkaline Phosphatase: 59 U/L (ref 38–126)
BILIRUBIN TOTAL: 0.4 mg/dL (ref 0.3–1.2)
BUN: 14 mg/dL (ref 6–20)
CHLORIDE: 111 mmol/L (ref 101–111)
CO2: 22 mmol/L (ref 22–32)
Calcium: 9 mg/dL (ref 8.9–10.3)
Creatinine, Ser: 0.91 mg/dL (ref 0.44–1.00)
GFR calc Af Amer: >60 mL/min (ref >60)
GFR calc non Af Amer: >60 mL/min (ref >60)
GLUCOSE: 107 mg/dL — AB (ref 65–99)
POTASSIUM: 3.9 mmol/L (ref 3.5–5.1)
Sodium: 139 mmol/L (ref 135–145)
TOTAL PROTEIN: 6.8 g/dL (ref 6.5–8.1)

## 2015-08-19 LAB — CBC
HCT: 38.3 % (ref 36.0–46.0)
Hemoglobin: 12.9 g/dL (ref 12.0–15.0)
MCH: 30.7 pg (ref 26.0–34.0)
MCHC: 33.7 g/dL (ref 30.0–36.0)
MCV: 91.2 fL (ref 78.0–100.0)
PLATELETS: 263 10*3/uL (ref 150–400)
RBC: 4.2 MIL/uL (ref 3.87–5.11)
RDW: 12.7 % (ref 11.5–15.5)
WBC: 7.6 10*3/uL (ref 4.0–10.5)

## 2015-08-19 LAB — ETHANOL: Alcohol, Ethyl (B): <5 mg/dL (ref <5)

## 2015-08-19 LAB — RAPID URINE DRUG SCREEN, HOSP PERFORMED
AMPHETAMINES: NOT DETECTED
BENZODIAZEPINES: NOT DETECTED
Barbiturates: NOT DETECTED
Cocaine: NOT DETECTED
OPIATES: NOT DETECTED
Tetrahydrocannabinol: NOT DETECTED

## 2015-08-19 LAB — SALICYLATE LEVEL: Salicylate Lvl: <4 mg/dL (ref 2.8–30.0)

## 2015-08-19 LAB — I-STAT BETA HCG BLOOD, ED (MC, WL, AP ONLY): I-stat hCG, quantitative: <5 m[IU]/mL (ref <5)

## 2015-08-19 LAB — ACETAMINOPHEN LEVEL: Acetaminophen (Tylenol), Serum: <10 ug/mL — ABNORMAL LOW (ref 10–30)

## 2015-08-19 MED ORDER — LORAZEPAM 1 MG PO TABS: 1.0000 mg | ORAL_TABLET | Freq: Three times a day (TID) | ORAL | Status: DC | PRN

## 2015-08-19 MED ORDER — ONDANSETRON HCL 4 MG PO TABS: 4.0000 mg | ORAL_TABLET | Freq: Three times a day (TID) | ORAL | Status: DC | PRN

## 2015-08-19 MED ORDER — LISDEXAMFETAMINE DIMESYLATE 50 MG PO CAPS: 50.0000 mg | ORAL_CAPSULE | Freq: Every day | ORAL | Status: DC

## 2015-08-19 MED ORDER — NICOTINE 21 MG/24HR TD PT24: 21.0000 mg | MEDICATED_PATCH | Freq: Every day | TRANSDERMAL | Status: DC

## 2015-08-19 MED ORDER — ACETAMINOPHEN 325 MG PO TABS: 650.0000 mg | ORAL_TABLET | ORAL | Status: DC | PRN

## 2015-08-19 MED ORDER — TRAZODONE HCL 100 MG PO TABS
100.0000 mg | ORAL_TABLET | Freq: Every day | ORAL | Status: DC
  Administered 2015-08-19: 100 mg via ORAL
  Filled 2015-08-19: qty 1

## 2015-08-19 MED ORDER — ZOLPIDEM TARTRATE 5 MG PO TABS: 5.0000 mg | ORAL_TABLET | Freq: Every evening | ORAL | Status: DC | PRN

## 2015-08-19 MED ORDER — SERTRALINE HCL 50 MG PO TABS
100.0000 mg | ORAL_TABLET | Freq: Two times a day (BID) | ORAL | Status: DC
  Administered 2015-08-19 – 2015-08-20 (×2): 100 mg via ORAL
  Filled 2015-08-19 (×2): qty 2

## 2015-08-19 MED ORDER — LURASIDONE HCL 40 MG PO TABS
120.0000 mg | ORAL_TABLET | Freq: Every day | ORAL | Status: DC
  Administered 2015-08-19: 120 mg via ORAL
  Filled 2015-08-19: qty 3

## 2015-08-19 NOTE — ED Provider Notes (Signed)
CSN: [id number]     Arrival date & time 08/19/15  2045 History  By signing my name below, I, Evon Slack, attest that this documentation has been prepared under the direction and in the presence of Earley Favor, NP. Electronically Signed: Evon Slack, ED Scribe. 08/19/2015. 9:29 PM.     Chief Complaint  Patient presents with  . Suicidal   The history is provided by the patient. No language interpreter was used.   HPI Comments: Nichole Lopez is a 30 y.o. female who presents to the Emergency Department complaining of SI and depression onset 3 weeks prior. Pt states that she has recently ran out of her medications was not able to make appointments. Pt reports Hx of self injury 3 years prior. Pt states that she did think about cutting her self again. Pt doesn't report any other complaints at this time.   Past Medical History  Diagnosis Date  . Asthma   . Mental disorder PTSD Depression Schizophrenic disorder,  anxiety  . Depression   . Deliberate self-cutting   . PTSD (post-traumatic stress disorder)   . Bipolar 1 disorder (HCC)   . Suicidal ideation    Past Surgical History  Procedure Laterality Date  . Foot surgery     Family History  Problem Relation Age of Onset  . Diabetes Other   . Depression Maternal Uncle   . Congenital heart disease Daughter     no surgical correction required at this time   Social History  Substance Use Topics  . Smoking status: Current Every Day Smoker -- 1.00 packs/day for 10 years    Types: Cigarettes  . Smokeless tobacco: Never Used  . Alcohol Use: Yes     Comment: socially   OB History    Gravida Para Term Preterm AB TAB SAB Ectopic Multiple Living   0 2     Review of Systems  Psychiatric/Behavioral: Positive for suicidal ideas and dysphoric mood. Negative for self-injury.  All other systems reviewed and are negative.     Allergies  Review of patient's allergies indicates no known allergies.  Home Medications    Prior to Admission medications   Medication Sig Start Date End Date Taking? Authorizing Provider  lisdexamfetamine (VYVANSE) 50 MG capsule Take 50 mg by mouth daily. Reported on 04/23/2015   Yes Historical Provider, MD  Lurasidone HCl (LATUDA) 120 MG TABS Take 1 tablet by mouth at bedtime. Reported on 04/23/2015   Yes Historical Provider, MD  sertraline (ZOLOFT) 100 MG tablet Take 100 mg by mouth 2 (two) times daily.    Yes Historical Provider, MD  traZODone (DESYREL) 100 MG tablet Take 100 mg by mouth at bedtime. 07/01/11  Yes Historical Provider, MD  Cimetidine (HEARTBURN RELIEF PO) Take 1 tablet by mouth 3 (three) times daily as needed (heartburn).    Historical Provider, MD  ibuprofen (ADVIL,MOTRIN) 600 MG tablet Take 1 tablet (600 mg total) by mouth every 6 (six) hours. Patient not taking: Reported on 08/19/2015 06/15/15   Montez Morita, CNM   BP 121/89 mmHg  Pulse 16  Temp(Src) 98.2 F (36.8 C) (Oral)  Resp 16  Ht  (1.753 m)  Wt 230 lb (104.327 kg)  BMI 33.95 kg/m2  SpO2 97%  LMP 07/24/2015 (Exact Date)  Breastfeeding? No   Physical Exam  Constitutional: She is oriented to person, place, and time. She appears well-developed and well-nourished. No distress.  HENT:  Head: Normocephalic and atraumatic.  Eyes: Conjunctivae and EOM are normal.  Neck: Neck supple. No tracheal deviation present.  Cardiovascular: Normal rate.   Pulmonary/Chest: Effort normal. No respiratory distress.  Musculoskeletal: Normal range of motion.  Neurological: She is alert and oriented to person, place, and time.  Skin: Skin is warm and dry.  Psychiatric: She has a normal mood and affect. Her behavior is normal.  Nursing note and vitals reviewed.   ED Course  Procedures (including critical care time) DIAGNOSTIC STUDIES: Oxygen Saturation is 97% on RA, normal by my interpretation.    COORDINATION OF CARE: 9:30 PM-Discussed treatment plan which includes psych eval with pt at bedside and pt  agreed to plan.     Labs Review Labs Reviewed  COMPREHENSIVE METABOLIC PANEL  ETHANOL  SALICYLATE LEVEL  ACETAMINOPHEN LEVEL  CBC  URINE RAPID DRUG SCREEN, HOSP PERFORMED  I-STAT BETA HCG BLOOD, ED ([facility], [facility], [facility] ONLY)    Imaging Review No results found. I have personally reviewed and evaluated these lab results as part of my medical decision-making.   EKG Interpretation None    will have patient evaluated by TTS   MDM   Final diagnoses:  None  I personally performed the services described in this documentation, which was scribed in my presence. The recorded information has been reviewed and is accurate.    Earley FavorGail Krew Hortman, NP 08/19/15 2333  Earley FavorGail Sakshi Sermons, NP 08/19/15 [id number]  Benjiman CoreNathan Pickering, MD 08/20/15 [phone]

## 2015-08-19 NOTE — ED Notes (Signed)
Sitter at bedside, pt changed into scrubs, wanded by security

## 2015-08-19 NOTE — ED Notes (Signed)
Pt brought to ED by [employer], pt communicated suicidal ideations with peer counselor then fell asleep, when she did not hear from patient she called 911, pt then picked up at home by [employer]. Pt calm and appropriate. Pt does endorse SI with no plan. Pt has been off meds except Vyvance x 3 weeks, pt states she was unable to get into see phys, increased sleeping, increased anger, increased crying, increased depression, pt also thinking more about self mutilation more recently. Has not actually cut herself.

## 2015-08-19 NOTE — ED Notes (Signed)
Patient reports SI with no plan and denies HI and AVH at this time. Plan of care discussed with patient. Patient verbalize no complaints or concerns at this time.Patient given a sandwich and soda. Encouragement and support provided and safety maintain. Q 15 min safety checks in place.

## 2015-08-19 NOTE — ED Notes (Signed)
Bed: WBH41 Expected date:  Expected time:  Means of arrival:  Comments: T4 

## 2015-08-20 DIAGNOSIS — F330 Major depressive disorder, recurrent, mild: Secondary | ICD-10-CM | POA: Diagnosis not present

## 2015-08-20 DIAGNOSIS — F603 Borderline personality disorder: Secondary | ICD-10-CM | POA: Diagnosis not present

## 2015-08-20 MED ORDER — SERTRALINE HCL 100 MG PO TABS: 100.0000 mg | ORAL_TABLET | Freq: Two times a day (BID) | ORAL | Status: DC

## 2015-08-20 MED ORDER — TRAZODONE HCL 100 MG PO TABS: 100.0000 mg | ORAL_TABLET | Freq: Every day | ORAL | Status: DC

## 2015-08-20 MED ORDER — LURASIDONE HCL 120 MG PO TABS: 1.0000 | ORAL_TABLET | Freq: Every day | ORAL | Status: DC

## 2015-08-20 NOTE — ED Notes (Signed)
Pt d/c home per MD order. Discharge summary reviewed with pt. Pt verbalizes understanding of discharge summary. RX provided. Pt denies SI/HI. Denies AVH. Pt signed for personal property and property returned. Pt signed e-signature. Ambulatory off unit.
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
## 2015-08-20 NOTE — [facility] Suicide Risk Assessment (Signed)
Suicide Risk Assessment  Discharge Assessment   [facility] - Inpatient Discharge Suicide Risk Assessment   Principal Problem: Major depressive disorder, recurrent episode, mild (HCC) Discharge Diagnoses:  Patient Active Problem List   Diagnosis Date Noted  . Major depressive disorder, recurrent episode, mild (HCC) [F33.0] 08/20/2015    Priority: High  . Borderline personality disorder [F60.3] 02/26/2011    Priority: High  . Major depressive disorder, recurrent episode, severe, without mention of psychotic behavior [F33.2] 11/30/2012    Priority: Low  . Alcohol dependence, continuous (HCC) [F10.20] 02/26/2011    Priority: Low  . 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 Post traumatic stress disorder (PTSD) [F43.10] 02/26/2011    Priority: Low  . NSVD (normal spontaneous vaginal delivery) [O80] 06/14/2015  . PROM (premature rupture of membranes) [O42.90] 06/13/2015  . Tobacco use disorder [F17.200] 05/07/2015  . Social problems, antepartum [O09.70] 03/12/2015  . Drug abuse during pregnancy [O99.320, F19.10] 01/12/2015  . Alcohol consumption during pregnancy in first trimester [O99.311] 12/29/2014  . Medication exposure during first trimester of pregnancy [O09.891] 12/29/2014  . Supervision of high risk pregnancy, antepartum [O09.90] 12/04/2014  . Bipolar disorder, current episode manic w/o psychotic features, severe (HCC) [F31.13] 06/28/2011    Class: Acute    Total Time spent with patient: 45 minutes  Musculoskeletal: Strength & Muscle Tone: within normal limits Gait & Station: normal Patient leans: N/A  Psychiatric Specialty Exam: Physical Exam  Constitutional: She is oriented to person, place, and time. She appears well-developed and well-nourished.  HENT:  Head: Normocephalic.  Neck: Normal range of motion.  Respiratory: Effort normal.  Musculoskeletal: Normal range of motion.  Neurological: She is alert and oriented to person, place, and time.  Skin:  Skin is dry.  Psychiatric: Her speech is normal and behavior is normal. Judgment and thought content normal. Cognition and memory are normal. She exhibits a depressed mood.    Review of Systems  Constitutional: Negative.   HENT: Negative.   Eyes: Negative.   Respiratory: Negative.   Cardiovascular: Negative.   Gastrointestinal: Negative.   Genitourinary: Negative.   Musculoskeletal: Negative.   Skin: Negative.   Neurological: Negative.   Endo/Heme/Allergies: Negative.   Psychiatric/Behavioral: Positive for depression.    Blood pressure 112/53, pulse 60, temperature 97.9 F (36.6 C), temperature source Oral, resp. rate 18, height  (1.753 m), weight 104.327 kg (230 lb), last menstrual period 07/24/2015, SpO2 100 %, not currently breastfeeding.Body mass index is 33.95 kg/(m^2).  General Appearance: Casual  Eye Contact:  Good  Speech:  Normal Rate  Volume:  Normal  Mood:  Depressed, mild  Affect:  Congruent  Thought Process:  Coherent and Descriptions of Associations: Intact  Orientation:  Full (Time, Place, and Person)  Thought Content:  WDL  Suicidal Thoughts:  No  Homicidal Thoughts:  No  Memory:  Immediate;   Good Recent;   Good Remote;   Good  Judgement:  Fair  Insight:  Fair  Psychomotor Activity:  Normal  Concentration:  Concentration: Good and Attention Span: Good  Recall:  Good  Fund of Knowledge:  Fair  Language:  Good  Akathisia:  No  Handed:  Right  AIMS (if indicated):     Assets:  Leisure Time Physical Health Resilience  ADL's:  Intact  Cognition:  WNL  Sleep:      Mental Status Per Nursing Assessment::   On Admission:   suicidal ideations  Demographic Factors:  Adolescent or young adult  Loss Factors: NA  Historical Factors: NA  Risk Reduction Factors:   Sense of responsibility to family, Positive social support and Positive therapeutic relationship  Continued Clinical Symptoms:  Depression, mild  Cognitive Features That Contribute  To Risk:  None    Suicide Risk:  Minimal: No identifiable suicidal ideation.  Patients presenting with no risk factors but with morbid ruminations; may be classified as minimal risk based on the severity of the depressive symptoms    Plan Of Care/Follow-up recommendations:  Activity:  as tolerated Diet:  heart healthy diet  LORD, JAMISON, NP 08/20/2015, 10:35 AM

## 2015-08-20 NOTE — Consult Note (Signed)
[facility] Consult   Reason for Consult:  Suicidal ideations  Referring Physician:  EDP Patient Identification: Nichole Lopez MRN:  [id number] Principal Diagnosis: Major depressive disorder, recurrent episode, mild ([place]) Diagnosis:   Patient Active Problem List   Diagnosis Date Noted  . Major depressive disorder, recurrent episode, mild ([place]) [F33.0] 08/20/2015    Priority: High  . Borderline personality disorder [F60.3] 02/26/2011    Priority: High  . Major depressive disorder, recurrent episode, severe, without mention of psychotic behavior [F33.2] 11/30/2012    Priority: Low  . Alcohol dependence, continuous ([place]) [F10.20] 02/26/2011    Priority: Low  . 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 Post traumatic stress disorder (PTSD) [F43.10] 02/26/2011    Priority: Low  . NSVD (normal spontaneous vaginal delivery) [O80] 06/14/2015  . PROM (premature rupture of membranes) [O42.90] 06/13/2015  . Tobacco use disorder [F17.200] 05/07/2015  . Social problems, antepartum [O09.70] 03/12/2015  . Drug abuse during pregnancy [O99.320, F19.10] 01/12/2015  . Alcohol consumption during pregnancy in first trimester [O99.311] 12/29/2014  . Medication exposure during first trimester of pregnancy [O09.891] 12/29/2014  . Supervision of high risk pregnancy, antepartum [O09.90] 12/04/2014  . Bipolar disorder, current episode manic w/o psychotic features, severe ([place]) [F31.13] 06/28/2011    Class: Acute    Total Time spent with patient: 45 minutes  Subjective:   Nichole Lopez is a 30 y.o. female patient does not warrant admission.  HPI:  30 yo female who presented to the ED requesting medication refills, depressed for the past 3 weeks since running out of her medications.  Denies suicidal/homicidal ideations, hallucinations, and substance abuse but does have a recent history of substance abuse.  She reports seeing Dr. Wonda Amis who prescribes her  medications and going to a SEL group.  Nichole Lopez became upset when she found out her Vyvanse would not be refilled but her other medications would.  Stable for discharge.  Past Psychiatric History: depression, PTSD, substance abuse  Risk to Self: Suicidal Ideation: No-Not Currently/Within Last 6 Months Suicidal Intent: No-Not Currently/Within Last 6 Months Is patient at risk for suicide?: Yes Suicidal Plan?: No Access to Means: No What has been your use of drugs/alcohol within the last 12 months?: Social drinker How many times?: 0 Other Self Harm Risks: Hx of cutting Triggers for Past Attempts: None known Intentional Self Injurious Behavior: Cutting Comment - Self Injurious Behavior: Thoughts of cutting Risk to Others: Homicidal Ideation: No Thoughts of Harm to Others: No Current Homicidal Intent: No Current Homicidal Plan: No Access to Homicidal Means: No Identified Victim: No one History of harm to others?: No Assessment of Violence: None Noted Violent Behavior Description: None reported Does patient have access to weapons?: No Criminal Charges Pending?: No Does patient have a court date: No Prior Inpatient Therapy: Prior Inpatient Therapy: Yes Prior Therapy Dates: A few years ago Prior Therapy Facilty/Provider(s): [facility] Reason for Treatment: depression Prior Outpatient Therapy: Prior Outpatient Therapy: Yes Prior Therapy Dates: Upcoming appt on 08/24/15 Prior Therapy Facilty/Provider(s): [facility] Reason for Treatment: new patient Does patient have an ACCT team?: No Does patient have Intensive In-House Services?  : No Does patient have [facility] services? : No Does patient have P4CC services?: No  Past Medical History:  Past Medical History  Diagnosis Date  . Asthma   . Mental disorder PTSD Depression Schizophrenic disorder,  anxiety  . Depression   . Deliberate self-cutting   . PTSD (post-traumatic stress disorder)   . Bipolar  1 disorder ([place])    . Suicidal ideation     Past Surgical History  Procedure Laterality Date  . Foot surgery     Family History:  Family History  Problem Relation Age of Onset  . Diabetes Other   . Depression Maternal Uncle   . Congenital heart disease Daughter     no surgical correction required at this time   Family Psychiatric  History: none Social History:  History  Alcohol Use  . Yes    Comment: socially     History  Drug Use  . 0.50 per week  . Special: Marijuana    Comment: 6 months ago    Social History   Social History  . Marital Status: Single    Spouse Name: N/A  . Number of Children: N/A  . Years of Education: N/A   Social History Main Topics  . Smoking status: Current Every Day Smoker -- 1.00 packs/day for 10 years    Types: Cigarettes  . Smokeless tobacco: Never Used  . Alcohol Use: Yes     Comment: socially  . Drug Use: 0.50 per week    Special: Marijuana     Comment: 6 months ago  . Sexual Activity: Not Currently    Birth Control/ Protection: None   Other Topics Concern  . None   Social History Narrative   Additional Social History:    Allergies:  No Known Allergies  Labs:  Results for orders placed or performed during the hospital encounter of 08/19/15 (from the past 48 hour(s))  Comprehensive metabolic panel     Status: Abnormal   Collection Time: 08/19/15  9:43 PM  Result Value Ref Range   Sodium 139 135 - 145 mmol/L   Potassium 3.9 3.5 - 5.1 mmol/L   Chloride 111 101 - 111 mmol/L   CO2 22 22 - 32 mmol/L   Glucose, Bld 107 (H) 65 - 99 mg/dL   BUN 14 6 - 20 mg/dL   Creatinine, Ser 0.91 0.44 - 1.00 mg/dL   Calcium 9.0 8.9 - 10.3 mg/dL   Total Protein 6.8 6.5 - 8.1 g/dL   Albumin 4.1 3.5 - 5.0 g/dL   AST 17 15 - 41 U/L   ALT 18 14 - 54 U/L   Alkaline Phosphatase 59 38 - 126 U/L   Total Bilirubin 0.4 0.3 - 1.2 mg/dL   GFR calc non Af Amer >60 >60 mL/min   GFR calc Af Amer >60 >60 mL/min    Comment: (NOTE) The eGFR has been calculated using  the CKD EPI equation. This calculation has not been validated in all clinical situations. eGFR's persistently <60 mL/min signify possible Chronic Kidney Disease.    Anion gap 6 5 - 15  Ethanol     Status: None   Collection Time: 08/19/15  9:43 PM  Result Value Ref Range   Alcohol, Ethyl (B) <5 <5 mg/dL    Comment:        LOWEST DETECTABLE LIMIT FOR SERUM ALCOHOL IS 5 mg/dL FOR MEDICAL PURPOSES ONLY   Salicylate level     Status: None   Collection Time: 08/19/15  9:43 PM  Result Value Ref Range   Salicylate Lvl <2.5 2.8 - 30.0 mg/dL  Acetaminophen level     Status: Abnormal   Collection Time: 08/19/15  9:43 PM  Result Value Ref Range   Acetaminophen (Tylenol), Serum <10 (L) 10 - 30 ug/mL    Comment:        THERAPEUTIC  CONCENTRATIONS VARY SIGNIFICANTLY. A RANGE OF 10-30 ug/mL MAY BE AN EFFECTIVE CONCENTRATION FOR MANY PATIENTS. HOWEVER, SOME ARE BEST TREATED AT CONCENTRATIONS OUTSIDE THIS RANGE. ACETAMINOPHEN CONCENTRATIONS >150 ug/mL AT 4 HOURS AFTER INGESTION AND >50 ug/mL AT 12 HOURS AFTER INGESTION ARE OFTEN ASSOCIATED WITH TOXIC REACTIONS.   cbc     Status: None   Collection Time: 08/19/15  9:43 PM  Result Value Ref Range   WBC 7.6 4.0 - 10.5 K/uL   RBC 4.20 3.87 - 5.11 MIL/uL   Hemoglobin 12.9 12.0 - 15.0 g/dL   HCT 38.3 36.0 - 46.0 %   MCV 91.2 78.0 - 100.0 fL   MCH 30.7 26.0 - 34.0 pg   MCHC 33.7 30.0 - 36.0 g/dL   RDW 12.7 11.5 - 15.5 %   Platelets 263 150 - 400 K/uL  I-Stat beta hCG blood, ED     Status: None   Collection Time: 08/19/15 10:03 PM  Result Value Ref Range   I-stat hCG, quantitative <5.0 <5 mIU/mL   Comment 3            Comment:   GEST. AGE      CONC.  (mIU/mL)   <=1 WEEK        5 - 50     2 WEEKS       50 - 500     3 WEEKS       100 - 10,000     4 WEEKS     1,000 - 30,000        FEMALE AND NON-PREGNANT FEMALE:     LESS THAN 5 mIU/mL   Rapid urine drug screen (hospital performed)     Status: None   Collection Time: 08/19/15 10:40 PM   Result Value Ref Range   Opiates NONE DETECTED NONE DETECTED   Cocaine NONE DETECTED NONE DETECTED   Benzodiazepines NONE DETECTED NONE DETECTED   Amphetamines NONE DETECTED NONE DETECTED   Tetrahydrocannabinol NONE DETECTED NONE DETECTED   Barbiturates NONE DETECTED NONE DETECTED    Comment:        DRUG SCREEN FOR MEDICAL PURPOSES ONLY.  IF CONFIRMATION IS NEEDED FOR ANY PURPOSE, NOTIFY LAB WITHIN 5 DAYS.        LOWEST DETECTABLE LIMITS FOR URINE DRUG SCREEN Drug Class       Cutoff (ng/mL) Amphetamine      1000 Barbiturate      200 Benzodiazepine   102 Tricyclics       725 Opiates          300 Cocaine          300 THC              50     Current Facility-Administered Medications  Medication Dose Route Frequency Provider Last Rate Last Dose  . acetaminophen (TYLENOL) tablet 650 mg  650 mg Oral Q4H PRN Junius Creamer, NP      . lurasidone (LATUDA) tablet 120 mg  120 mg Oral QHS Junius Creamer, NP   120 mg at 08/19/15 2359  . nicotine (NICODERM CQ - dosed in mg/24 hours) patch 21 mg  21 mg Transdermal Daily Junius Creamer, NP   21 mg at 08/20/15 1003  . ondansetron (ZOFRAN) tablet 4 mg  4 mg Oral Q8H PRN Junius Creamer, NP      . sertraline (ZOLOFT) tablet 100 mg  100 mg Oral BID Junius Creamer, NP   100 mg at 08/19/15 2359  . traZODone (DESYREL) tablet 100  mg  100 mg Oral QHS Junius Creamer, NP   100 mg at 08/19/15 2359  . zolpidem (AMBIEN) tablet 5 mg  5 mg Oral QHS PRN Junius Creamer, NP       Current Outpatient Prescriptions  Medication Sig Dispense Refill  . lisdexamfetamine (VYVANSE) 50 MG capsule Take 50 mg by mouth daily. Reported on 04/23/2015    . Lurasidone HCl (LATUDA) 120 MG TABS Take 1 tablet by mouth at bedtime. Reported on 04/23/2015    . sertraline (ZOLOFT) 100 MG tablet Take 100 mg by mouth 2 (two) times daily.     . traZODone (DESYREL) 100 MG tablet Take 100 mg by mouth at bedtime.    . Cimetidine (HEARTBURN RELIEF PO) Take 1 tablet by mouth 3 (three) times daily as needed  (heartburn).    Marland Kitchen ibuprofen (ADVIL,MOTRIN) 600 MG tablet Take 1 tablet (600 mg total) by mouth every 6 (six) hours. (Patient not taking: Reported on 08/19/2015) 30 tablet 0    Musculoskeletal: Strength & Muscle Tone: within normal limits Gait & Station: normal Patient leans: N/A  Psychiatric Specialty Exam: Physical Exam  Constitutional: She is oriented to person, place, and time. She appears well-developed and well-nourished.  HENT:  Head: Normocephalic.  Neck: Normal range of motion.  Respiratory: Effort normal.  Musculoskeletal: Normal range of motion.  Neurological: She is alert and oriented to person, place, and time.  Skin: Skin is dry.  Psychiatric: Her speech is normal and behavior is normal. Judgment and thought content normal. Cognition and memory are normal. She exhibits a depressed mood.    Review of Systems  Constitutional: Negative.   HENT: Negative.   Eyes: Negative.   Respiratory: Negative.   Cardiovascular: Negative.   Gastrointestinal: Negative.   Genitourinary: Negative.   Musculoskeletal: Negative.   Skin: Negative.   Neurological: Negative.   Endo/Heme/Allergies: Negative.   Psychiatric/Behavioral: Positive for depression.    Blood pressure 112/53, pulse 60, temperature 97.9 F (36.6 C), temperature source Oral, resp. rate 18, height _0  (1.753 m), weight 104.327 kg (230 lb), last menstrual period 07/24/2015, SpO2 100 %, not currently breastfeeding.Body mass index is 33.95 kg/(m^2).  General Appearance: Casual  Eye Contact:  Good  Speech:  Normal Rate  Volume:  Normal  Mood:  Depressed, mild  Affect:  Congruent  Thought Process:  Coherent and Descriptions of Associations: Intact  Orientation:  Full (Time, Place, and Person)  Thought Content:  WDL  Suicidal Thoughts:  No  Homicidal Thoughts:  No  Memory:  Immediate;   Good Recent;   Good Remote;   Good  Judgement:  Fair  Insight:  Fair  Psychomotor Activity:  Normal  Concentration:   Concentration: Good and Attention Span: Good  Recall:  Good  Fund of Knowledge:  Fair  Language:  Good  Akathisia:  No  Handed:  Right  AIMS (if indicated):     Assets:  Leisure Time Physical Health Resilience  ADL's:  Intact  Cognition:  WNL  Sleep:        Treatment Plan Summary: Daily contact with patient to assess and evaluate symptoms and progress in treatment, Medication management and Plan major depressive disorder, recurrent, mild:  -Crisis stabilization -Medication management:  Restarted Zoloft 100 mg BID for depression, Latuda 120 mg at bedtime for mood stabilization, and Trazodone 100 mg at bedtime for sleep.  Did not continue her Vyvanse 50 mg daily for ADHD due to no documentation/substance abuse -Individual counseling  Disposition: No evidence of imminent risk  to self or others at present.    Waylan Boga, NP 08/20/2015 10:18 AM Patient seen face-to-face for psychiatric evaluation, chart reviewed and case discussed with the physician extender and developed treatment plan. Reviewed the information documented and agree with the treatment plan. Corena Pilgrim, MD

## 2015-08-20 NOTE — BH Assessment (Signed)
[facility] Assessment Progress Note  Per Thedore MinsMojeed Akintayo, MD, this pt does not require psychiatric hospitalization at this time.  [facility]  It is uncertain who her current outpatient provider is.  Discharge instructions advise pt to follow up with this provider.  Pt's nurse, Morrie Sheldonshley, has been notified.  Doylene Canninghomas Loy Mccartt, MA Triage Specialist [phone]

## 2015-08-20 NOTE — Discharge Instructions (Signed)
For your ongoing behavioral health needs, you are advised to continue treatment with your current outpatient provider.

## 2015-09-04 ENCOUNTER — Encounter (HOSPITAL_COMMUNITY): Payer: Self-pay | Admitting: Emergency Medicine

## 2015-09-04 ENCOUNTER — Emergency Department (HOSPITAL_COMMUNITY)
Admission: EM | Admit: 2015-09-04 | Discharge: 2015-09-05 | Disposition: A | Discharge status: Home / Self Care | Payer: Medicare Other | Attending: Emergency Medicine | Admitting: Emergency Medicine

## 2015-09-04 DIAGNOSIS — F1290 Cannabis use, unspecified, uncomplicated: Secondary | ICD-10-CM | POA: Insufficient documentation

## 2015-09-04 DIAGNOSIS — F603 Borderline personality disorder: Secondary | ICD-10-CM | POA: Diagnosis not present

## 2015-09-04 DIAGNOSIS — F330 Major depressive disorder, recurrent, mild: Secondary | ICD-10-CM | POA: Diagnosis not present

## 2015-09-04 DIAGNOSIS — J45909 Unspecified asthma, uncomplicated: Secondary | ICD-10-CM | POA: Diagnosis not present

## 2015-09-04 DIAGNOSIS — Z79899 Other long term (current) drug therapy: Secondary | ICD-10-CM | POA: Insufficient documentation

## 2015-09-04 DIAGNOSIS — F17210 Nicotine dependence, cigarettes, uncomplicated: Secondary | ICD-10-CM | POA: Insufficient documentation

## 2015-09-04 DIAGNOSIS — R45851 Suicidal ideations: Secondary | ICD-10-CM | POA: Insufficient documentation

## 2015-09-04 LAB — CBC WITH DIFFERENTIAL/PLATELET
Basophils Absolute: 0 10*3/uL (ref 0.0–0.1)
Basophils Relative: 1 %
EOS PCT: 2 %
Eosinophils Absolute: 0.1 10*3/uL (ref 0.0–0.7)
HCT: 39.8 % (ref 36.0–46.0)
Hemoglobin: 13.6 g/dL (ref 12.0–15.0)
LYMPHS ABS: 2.6 10*3/uL (ref 0.7–4.0)
LYMPHS PCT: 42 %
MCH: 30.2 pg (ref 26.0–34.0)
MCHC: 34.2 g/dL (ref 30.0–36.0)
MCV: 88.4 fL (ref 78.0–100.0)
MONO ABS: 0.6 10*3/uL (ref 0.1–1.0)
MONOS PCT: 9 %
Neutro Abs: 3 10*3/uL (ref 1.7–7.7)
Neutrophils Relative %: 46 %
PLATELETS: 279 10*3/uL (ref 150–400)
RBC: 4.5 MIL/uL (ref 3.87–5.11)
RDW: 12 % (ref 11.5–15.5)
WBC: 6.3 10*3/uL (ref 4.0–10.5)

## 2015-09-04 LAB — COMPREHENSIVE METABOLIC PANEL
ALBUMIN: 4.5 g/dL (ref 3.5–5.0)
ALT: 10 U/L — ABNORMAL LOW (ref 14–54)
AST: 14 U/L — AB (ref 15–41)
Alkaline Phosphatase: 64 U/L (ref 38–126)
Anion gap: 9 (ref 5–15)
BILIRUBIN TOTAL: 0.5 mg/dL (ref 0.3–1.2)
BUN: 5 mg/dL — AB (ref 6–20)
CHLORIDE: 108 mmol/L (ref 101–111)
CO2: 22 mmol/L (ref 22–32)
Calcium: 9.2 mg/dL (ref 8.9–10.3)
Creatinine, Ser: 0.88 mg/dL (ref 0.44–1.00)
GFR calc Af Amer: >60 mL/min (ref >60)
GFR calc non Af Amer: >60 mL/min (ref >60)
GLUCOSE: 97 mg/dL (ref 65–99)
POTASSIUM: 3.5 mmol/L (ref 3.5–5.1)
Sodium: 139 mmol/L (ref 135–145)
Total Protein: 7.1 g/dL (ref 6.5–8.1)

## 2015-09-04 LAB — ETHANOL: Alcohol, Ethyl (B): 179 mg/dL — ABNORMAL HIGH (ref <5)

## 2015-09-04 MED ORDER — LORAZEPAM 1 MG PO TABS
1.0000 mg | ORAL_TABLET | Freq: Three times a day (TID) | ORAL | Status: DC | PRN
Start: 2015-09-04 — End: 2015-09-05
  Administered 2015-09-05: 1 mg via ORAL
  Filled 2015-09-04: qty 1

## 2015-09-04 MED ORDER — ACETAMINOPHEN 325 MG PO TABS
650.0000 mg | ORAL_TABLET | ORAL | Status: DC | PRN
  Administered 2015-09-05: 650 mg via ORAL
  Filled 2015-09-04: qty 2

## 2015-09-04 MED ORDER — NICOTINE 21 MG/24HR TD PT24
21.0000 mg | MEDICATED_PATCH | Freq: Every day | TRANSDERMAL | Status: DC
  Administered 2015-09-05: 21 mg via TRANSDERMAL
  Filled 2015-09-04: qty 1

## 2015-09-04 NOTE — ED Triage Notes (Signed)
Pt transported from her home by [employer] after receiving a call from crisis line regarding patient. Pt admits to suicidal ideation to [employer] with no specific plan. Pt being loud in triage, NT in room for VSS, pt intentially rubbed gentials of tech in room. [employer] aware

## 2015-09-04 NOTE — ED Notes (Signed)
Pack of cigarettes,lighter.brush.3 makeup brushes,makeup, lanyard with keys and pepper spray,white phone charger,blinged purple cell phone case.Black duffle bag. Clothes.   With security will be - A white samsung phone a black wallet does not have any cash.

## 2015-09-04 NOTE — ED Provider Notes (Signed)
[facility]-EMERGENCY DEPT Provider Note   CSN: [id number] Arrival date & time: 09/04/15  2119  First Provider Contact:  First MD Initiated Contact with Patient 09/04/15 2217    By signing my name below, I, [name], attest that this documentation has been prepared under the direction and in the presence of [name], PA-C. Electronically Signed: [name], ED Scribe. 09/04/15. 10:23 PM.    History   Chief Complaint Chief Complaint  Patient presents with  . Suicidal    HPI Comments: Nichole Lopez is a 30 y.o. female with h/o depression, PTSD, and bipolar disorder who presents to the Emergency Department by [employer] for suicidal ideations. Pt notes it's due to recent stressors. Pt was brought in by [employer] after receiving a call from crisis line regarding patient. Pt admits to having suicidal ideations with no specific plan. She reports she thinks of overdosing, hanging herself, and cutting herself. Pt smokes cigarettes. Pt denies fever and abdominal pain. Pt is in no pain at this time.   The history is provided by the patient. No language interpreter was used.    Past Medical History:  Diagnosis Date  . Asthma   . Bipolar 1 disorder ([facility])   . Deliberate self-cutting   . Depression   . Mental disorder PTSD Depression Schizophrenic disorder,  anxiety  . PTSD (post-traumatic stress disorder)   . Suicidal ideation     Patient Active Problem List   Diagnosis Date Noted  . Major depressive disorder, recurrent episode, mild ([facility]) 08/20/2015  . NSVD (normal spontaneous vaginal delivery) 06/14/2015  . PROM (premature rupture of membranes) 06/13/2015  . Tobacco use disorder 05/07/2015  . Social problems, antepartum 03/12/2015  . Drug abuse during pregnancy 01/12/2015  . Alcohol consumption during pregnancy in first trimester 12/29/2014  . Medication exposure during first trimester of pregnancy 12/29/2014  . Supervision of high risk pregnancy, antepartum 12/04/2014  . Major depressive  disorder, recurrent episode, severe, without mention of psychotic behavior 11/30/2012  . Bipolar disorder, current episode manic w/o psychotic features, severe ([facility]) 06/28/2011    Class: Acute  . Alcohol dependence, continuous ([facility]) 02/26/2011  . Substance-induced disorder ([facility]) 02/26/2011  . Post traumatic stress disorder (PTSD) 02/26/2011  . Borderline personality disorder 02/26/2011    Past Surgical History:  Procedure Laterality Date  . FOOT SURGERY      OB History    Gravida Para Term Preterm AB Living   SAB TAB Ectopic Multiple Live Births   1     0 2       Home Medications    Prior to Admission medications   Medication Sig Start Date End Date Taking? Authorizing Provider  Lurasidone HCl (LATUDA) 120 MG TABS Take 1 tablet (120 mg total) by mouth at bedtime. Reported on 04/23/2015 08/20/15  Yes Charm Rings, NP  sertraline (ZOLOFT) 100 MG tablet Take 1 tablet (100 mg total) by mouth 2 (two) times daily. 08/20/15  Yes Charm Rings, NP  traZODone (DESYREL) 100 MG tablet Take 1 tablet (100 mg total) by mouth at bedtime. 08/20/15  Yes Charm Rings, NP  VYVANSE 50 MG capsule Take 50 mg by mouth daily. 08/20/15  Yes Historical Provider, MD  ibuprofen (ADVIL,MOTRIN) 600 MG tablet Take 1 tablet (600 mg total) by mouth every 6 (six) hours. Patient not taking: Reported on 08/19/2015 06/15/15   Montez Morita, CNM    Family History Family History  Problem Relation Age  of Onset  . Depression Maternal Uncle   . Diabetes Other   . Congenital heart disease Daughter     no surgical correction required at this time    Social History Social History  Substance Use Topics  . Smoking status: Current Every Day Smoker    Packs/day: 1.00    Years: 10.00    Types: Cigarettes  . Smokeless tobacco: Never Used  . Alcohol use Yes     Comment: socially     Allergies   Review of patient's allergies indicates no known allergies.   Review of Systems Review of Systems    Constitutional: Negative for fever.  Gastrointestinal: Negative for abdominal pain.  Psychiatric/Behavioral: Positive for suicidal ideas. Negative for hallucinations.   Physical Exam Updated Vital Signs BP 101/63 (BP Location: Left Arm)   Pulse 84   Temp 98 F (36.7 C)   Resp 18   LMP 07/24/2015 (Exact Date)   SpO2 98%   Physical Exam  Constitutional: She appears well-developed and well-nourished.  HENT:  Head: Normocephalic.  Eyes: Conjunctivae are normal.  Cardiovascular: Normal rate, regular rhythm and normal heart sounds.  Exam reveals no gallop and no friction rub.   No murmur heard. Pulmonary/Chest: Effort normal and breath sounds normal. No respiratory distress. She has no wheezes. She has no rales.  Abdominal: She exhibits no distension.  Musculoskeletal: Normal range of motion.  Neurological: She is alert.  Skin: Skin is warm and dry.  Psychiatric: Her speech is normal. Her affect is inappropriate. She expresses suicidal ideation.  Nursing note and vitals reviewed.    ED Treatments / Results  DIAGNOSTIC STUDIES: Oxygen Saturation is 100% on RA, normal by my interpretation.    COORDINATION OF CARE: 10:19 PM Discussed treatment plan with pt at bedside which includes counseling and pt agreed to plan.  Labs (all labs ordered are listed, but only abnormal results are displayed) Labs Reviewed  COMPREHENSIVE METABOLIC PANEL - Abnormal; Notable for the following:       Result Value   BUN 5 (*)    AST 14 (*)    ALT 10 (*)    All other components within normal limits  URINE RAPID DRUG SCREEN, HOSP PERFORMED - Abnormal; Notable for the following:    Amphetamines POSITIVE (*)    All other components within normal limits  ETHANOL - Abnormal; Notable for the following:    Alcohol, Ethyl (B) 179 (*)    All other components within normal limits  CBC WITH DIFFERENTIAL/PLATELET    EKG  EKG Interpretation None       Radiology No results  found.  Procedures Procedures (including critical care time)  Medications Ordered in ED Medications  nicotine (NICODERM CQ - dosed in mg/24 hours) patch 21 mg (not administered)  acetaminophen (TYLENOL) tablet 650 mg (not administered)  LORazepam (ATIVAN) tablet 1 mg (not administered)     Initial Impression / Assessment and Plan / ED Course  I have reviewed the triage vital signs and the nursing notes.  Pertinent labs & imaging results that were available during my care of the patient were reviewed by me and considered in my medical decision making (see chart for details).  Clinical Course    1. Suicidal ideation  Final Clinical Impressions(s) / ED Diagnoses   Final diagnoses:  None    New Prescriptions New Prescriptions   No medications on file  I personally performed the services described in this documentation, which was scribed in my presence. The recorded  information has been reviewed and is accurate.      [name], PA-C 09/17/15 0004    Melene Plan, DO 09/21/15 [id number]

## 2015-09-04 NOTE — ED Notes (Signed)
Pt currently on floor of triage room, pt refusing to get up, pt yelling "I'm Nichole Lopez, I'm being arrested because I am a negro"

## 2015-09-05 DIAGNOSIS — F603 Borderline personality disorder: Secondary | ICD-10-CM

## 2015-09-05 DIAGNOSIS — F330 Major depressive disorder, recurrent, mild: Secondary | ICD-10-CM | POA: Diagnosis not present

## 2015-09-05 DIAGNOSIS — R45851 Suicidal ideations: Secondary | ICD-10-CM | POA: Diagnosis not present

## 2015-09-05 LAB — RAPID URINE DRUG SCREEN, HOSP PERFORMED
AMPHETAMINES: POSITIVE — AB
BARBITURATES: NOT DETECTED
Benzodiazepines: NOT DETECTED
Cocaine: NOT DETECTED
Opiates: NOT DETECTED
TETRAHYDROCANNABINOL: NOT DETECTED

## 2015-09-05 MED ORDER — TRAZODONE HCL 100 MG PO TABS: 100.0000 mg | ORAL_TABLET | Freq: Every day | ORAL | 0 refills | Status: AC

## 2015-09-05 MED ORDER — LURASIDONE HCL 120 MG PO TABS: 1.0000 | ORAL_TABLET | Freq: Every day | ORAL | 0 refills | Status: DC

## 2015-09-05 MED ORDER — LURASIDONE HCL 80 MG PO TABS: 120.0000 mg | ORAL_TABLET | Freq: Every day | ORAL | Status: DC

## 2015-09-05 MED ORDER — TRAZODONE HCL 100 MG PO TABS: 100.0000 mg | ORAL_TABLET | Freq: Every day | ORAL | Status: DC

## 2015-09-05 MED ORDER — SERTRALINE HCL 50 MG PO TABS
100.0000 mg | ORAL_TABLET | Freq: Two times a day (BID) | ORAL | Status: DC
  Administered 2015-09-05: 100 mg via ORAL
  Filled 2015-09-05: qty 2

## 2015-09-05 MED ORDER — SERTRALINE HCL 100 MG PO TABS: 100.0000 mg | ORAL_TABLET | Freq: Two times a day (BID) | ORAL | 0 refills | Status: DC

## 2015-09-05 NOTE — ED Notes (Signed)
Pt also has in her belongings bag her belly ring, lip ring

## 2015-09-05 NOTE — ED Notes (Signed)
Patient noted in room. No complaints, stable, in no acute distress. Q15 minute rounds and monitoring via security cameras continue for safety. 

## 2015-09-05 NOTE — [facility] Suicide Risk Assessment (Signed)
Suicide Risk Assessment  Discharge Assessment   [facility] Discharge Suicide Risk Assessment   Principal Problem: Major depressive disorder, recurrent episode, mild (HCC) Discharge Diagnoses:  Patient Active Problem List   Diagnosis Date Noted  . Major depressive disorder, recurrent episode, mild (HCC) [F33.0] 08/20/2015    Priority: High  . Borderline personality disorder [F60.3] 02/26/2011    Priority: High  . Major depressive disorder, recurrent episode, severe, without mention of psychotic behavior [F33.2] 11/30/2012    Priority: Low  . Alcohol dependence, continuous (HCC) [F10.20] 02/26/2011    Priority: Low  . 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 Post traumatic stress disorder (PTSD) [F43.10] 02/26/2011    Priority: Low  . NSVD (normal spontaneous vaginal delivery) [O80] 06/14/2015  . PROM (premature rupture of membranes) [O42.90] 06/13/2015  . Tobacco use disorder [F17.200] 05/07/2015  . Social problems, antepartum [O09.70] 03/12/2015  . Drug abuse during pregnancy [O99.320, F19.10] 01/12/2015  . Alcohol consumption during pregnancy in first trimester [O99.311] 12/29/2014  . Medication exposure during first trimester of pregnancy [O09.891] 12/29/2014  . Supervision of high risk pregnancy, antepartum [O09.90] 12/04/2014  . Bipolar disorder, current episode manic w/o psychotic features, severe (HCC) [F31.13] 06/28/2011    Class: Acute    Total Time spent with patient: 45 minutes  Musculoskeletal: Strength & Muscle Tone: within normal limits Gait & Station: normal Patient leans: N/A  Psychiatric Specialty Exam: Physical Exam  Constitutional: She is oriented to person, place, and time. She appears well-developed and well-nourished.  HENT:  Head: Normocephalic.  Neck: Normal range of motion.  Respiratory: Effort normal.  Musculoskeletal: Normal range of motion.  Neurological: She is alert and oriented to person, place, and time.  Skin:  Skin is warm and dry.  Psychiatric: Her speech is normal and behavior is normal. Judgment and thought content normal. Cognition and memory are normal. She exhibits a depressed mood.    Review of Systems  Constitutional: Negative.   HENT: Negative.   Eyes: Negative.   Respiratory: Negative.   Cardiovascular: Negative.   Gastrointestinal: Negative.   Genitourinary: Negative.   Musculoskeletal: Negative.   Skin: Negative.   Neurological: Negative.   Endo/Heme/Allergies: Negative.   Psychiatric/Behavioral: Positive for depression and substance abuse.    Blood pressure (!) 102/43, pulse 85, temperature 98.1 F (36.7 C), temperature source Oral, resp. rate 17, last menstrual period 07/24/2015, SpO2 99 %, not currently breastfeeding.There is no height or weight on file to calculate BMI.  General Appearance: Casual  Eye Contact:  Good  Speech:  Normal Rate  Volume:  Normal  Mood:  Depressed  Affect:  Congruent  Thought Process:  Coherent and Descriptions of Associations: Intact  Orientation:  Full (Time, Place, and Person)  Thought Content:  WDL  Suicidal Thoughts:  No  Homicidal Thoughts:  No  Memory:  Immediate;   Good Recent;   Good Remote;   Good  Judgement:  Fair  Insight:  Fair  Psychomotor Activity:  Normal  Concentration:  Concentration: Good and Attention Span: Good  Recall:  Good  Fund of Knowledge:  Good  Language:  Good  Akathisia:  No  Handed:  Right  AIMS (if indicated):     Assets:  Housing Leisure Time Physical Health Resilience Social Support  ADL's:  Intact  Cognition:  WNL  Sleep:      Mental Status Per Nursing Assessment::   On Admission:   alcohol intoxication and suicidal ideations  Demographic Factors:  Adolescent or young  adult and Living alone  Loss Factors: NA  Historical Factors: NA  Risk Reduction Factors:   Sense of responsibility to family, Positive social support and Positive therapeutic relationship  Continued Clinical  Symptoms:  Depression, mild  Cognitive Features That Contribute To Risk:  None    Suicide Risk:  Minimal: No identifiable suicidal ideation.  Patients presenting with no risk factors but with morbid ruminations; may be classified as minimal risk based on the severity of the depressive symptoms    Plan Of Care/Follow-up recommendations:  Activity:  as tolerated Diet:  heart healthy diet  Nichole Vejar, NP 09/05/2015, 10:09 AM

## 2015-09-05 NOTE — ED Notes (Signed)
Pt verbalized readiness to be discharged to home. States that she lives alone in an apartment.  She wanted an explanation as to why the doctor would not prescribe Vyvance for her. When her medications were returned, and counted in front of her, she expressed concern and dismay that there were only 3 Vyvance left. She insisted that she came in with more than that. The fill date on the bottle was 7/13. The medications had been stored in the Rx vault and had been counted and signed off by two RNs at admission. Pt states that she was drunk at admission and unable to signed. She did not remember anything about it. She was given the number for risk management at discharge. All belongings returned to pt who signed for them. She also signed for the medications that were returned to her even though she disputed the amount of Vyvance. Denies SI/HI at this time. She did not appear to be delusional and was not responding to internal stimuli.

## 2015-09-05 NOTE — BH Assessment (Signed)
Assessment completed. Consulted with Maryjean Morn, PA-C who recommends overnight observation and evaluation by psychiatry in the morning.   Davina Poke, LCSW Therapeutic Triage Specialist [facility] 09/05/2015 1:22 AM

## 2015-09-05 NOTE — ED Triage Notes (Signed)
ED nurse stated that patient took all her piercing's out that may caused her harm. When taking vitals this morning, this writer noticed the pt still had a bar ring running through her tongue. Nurse notified!

## 2015-09-05 NOTE — Consult Note (Signed)
[facility] Consult   Reason for Consult:  Alcohol intoxication with suicidal ideations Referring Physician:  EDP Patient Identification: Nichole Lopez MRN:  [id number] Principal Diagnosis: Major depressive disorder, recurrent episode, mild ([place]) Diagnosis:   Patient Active Problem List   Diagnosis Date Noted  . Major depressive disorder, recurrent episode, mild ([place]) [F33.0] 08/20/2015    Priority: High  . Borderline personality disorder [F60.3] 02/26/2011    Priority: High  . Major depressive disorder, recurrent episode, severe, without mention of psychotic behavior [F33.2] 11/30/2012    Priority: Low  . Alcohol dependence, continuous ([place]) [F10.20] 02/26/2011    Priority: Low  . 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 Post traumatic stress disorder (PTSD) [F43.10] 02/26/2011    Priority: Low  . NSVD (normal spontaneous vaginal delivery) [O80] 06/14/2015  . PROM (premature rupture of membranes) [O42.90] 06/13/2015  . Tobacco use disorder [F17.200] 05/07/2015  . Social problems, antepartum [O09.70] 03/12/2015  . Drug abuse during pregnancy [O99.320, F19.10] 01/12/2015  . Alcohol consumption during pregnancy in first trimester [O99.311] 12/29/2014  . Medication exposure during first trimester of pregnancy [O09.891] 12/29/2014  . Supervision of high risk pregnancy, antepartum [O09.90] 12/04/2014  . Bipolar disorder, current episode manic w/o psychotic features, severe ([place]) [F31.13] 06/28/2011    Class: Acute    Total Time spent with patient: 45 minutes  Subjective:   Nichole Lopez is a 30 y.o. female patient does not warrant admission.  HPI:  On admission:  30 y.o. female presenting to [facility] voluntarily after calling [facility] and stating that she was suicidal with no plan. Patient denies SI at the time of the assessment and then states that she feels suicidal "sometimes, but just thoughts." Patient states that she has  had previous attempts but cannot recall the time frames or methods. Patient states that she has a history of cutting about three years ago and thought about cutting earlier this month. Patient states her reason for cutting as "it made me feel better." Patient denies HI and history of aggression. Patient denies pending charges or upcoming court dates. Patient denies access to firearms. Patient denies AVH and does not appear to be responding to internal stimuli during the assessment. Patient denies use of drugs and alcohol, however, patient BAL 172 at time of assessment. Patient requested Vyvanse several times during the assessment and reports that she "needs to take it for ADHD and it's the only medication my insurance covers." Patient states that she is prescribed Latuda, Zoloft, Vyvanse, and Trazadone  Patient states that she takes as prescribed and has "enough left for about a week." Patient states "I need more because everybody I called can't see me for a while." Patient states that she had an appointment scheduled with a psychiatristr, however, she was discharged due to multiple cancellation.  Patient states that she went to [place] who told her "I take too much medication and they don't want to help me." Patient states that she is in need of a psychiatrist and requests Vyvanse and an appointment with a psychiatrist. Patient UDS not collected at time of the assessment.   Today:  Patient denies suicidal ideations, relates her suicidal ideations to getting "drunk last night".  No homicidal ideations, hallucinations, or drug abuse.  No withdrawal symptoms.  Nichole Lopez is worried she will run out of her medications from discharge on 7/12 from [facility].  She is comfortable with Rx and following outpatient at the [facility].  Her Vyyanse  will not be prescribed due to being a narcotic and lack of testing.  Past Psychiatric History: depression  Risk to Self: Suicidal Ideation: No Suicidal Intent: No Is  patient at risk for suicide?: No Suicidal Plan?: No Specify Current Suicidal Plan: Denies Access to Means: No What has been your use of drugs/alcohol within the last 12 months?: Denies How many times?:  ("a few times") Other Self Harm Risks: History of cutting Triggers for Past Attempts: Other (Comment) ("DSS") Intentional Self Injurious Behavior: Cutting ("it made me feel better") Comment - Self Injurious Behavior: history of cutting "years ago" Risk to Others: Homicidal Ideation: No Thoughts of Harm to Others: No Current Homicidal Intent: No Current Homicidal Plan: No Access to Homicidal Means: No Identified Victim: Denies History of harm to others?: No Assessment of Violence: None Noted Violent Behavior Description: Denies Does patient have access to weapons?: No Criminal Charges Pending?: No Does patient have a court date: No Prior Inpatient Therapy: Prior Inpatient Therapy: Yes Prior Therapy Dates: 2012 Prior Therapy Facilty/Provider([facility]): [facility] Reason for Treatment: Depression Prior Outpatient Therapy: Prior Outpatient Therapy: Yes Prior Therapy Dates: 2017 Prior Therapy Facilty/Provider(s): [facility] Reason for Treatment: [facility] Does patient have an ACCT team?: No Does patient have Intensive In-House Services?  : No Does patient have [facility] services? : No  Past Medical History:  Past Medical History:  Diagnosis Date  . Asthma   . Bipolar 1 disorder ([place])   . Deliberate self-cutting   . Depression   . Mental disorder PTSD Depression Schizophrenic disorder,  anxiety  . PTSD (post-traumatic stress disorder)   . Suicidal ideation     Past Surgical History:  Procedure Laterality Date  . FOOT SURGERY     Family History:  Family History  Problem Relation Age of Onset  . Depression Maternal Uncle   . Diabetes Other   . Congenital heart disease Daughter     no surgical correction required at this time   Family Psychiatric  History: none Social History:  History   Alcohol Use  . Yes    Comment: socially     History  Drug Use  . Frequency: 0.5 times per week  . Types: Marijuana    Comment: 6 months ago    Social History   Social History  . Marital status: Single    Spouse name: N/A  . Number of children: N/A  . Years of education: N/A   Social History Main Topics  . Smoking status: Current Every Day Smoker    Packs/day: 1.00    Years: 10.00    Types: Cigarettes  . Smokeless tobacco: Never Used  . Alcohol use Yes     Comment: socially  . Drug use:     Frequency: 0.5 times per week    Types: Marijuana     Comment: 6 months ago  . Sexual activity: Not Currently    Birth control/ protection: None   Other Topics Concern  . None   Social History Narrative  . None   Additional Social History:    Allergies:  No Known Allergies  Labs:  Results for orders placed or performed during the hospital encounter of 09/04/15 (from the past 48 hour(s))  Urine rapid drug screen (hosp performed)     Status: Abnormal   Collection Time: 09/04/15  1:14 AM  Result Value Ref Range   Opiates NONE DETECTED NONE DETECTED   Cocaine NONE DETECTED NONE DETECTED   Benzodiazepines NONE DETECTED NONE DETECTED   Amphetamines  POSITIVE (A) NONE DETECTED   Tetrahydrocannabinol NONE DETECTED NONE DETECTED   Barbiturates NONE DETECTED NONE DETECTED    Comment:        DRUG SCREEN FOR MEDICAL PURPOSES ONLY.  IF CONFIRMATION IS NEEDED FOR ANY PURPOSE, NOTIFY LAB WITHIN 5 DAYS.        LOWEST DETECTABLE LIMITS FOR URINE DRUG SCREEN Drug Class       Cutoff (ng/mL) Amphetamine      1000 Barbiturate      200 Benzodiazepine   885 Tricyclics       027 Opiates          300 Cocaine          300 THC              50   Comprehensive metabolic panel     Status: Abnormal   Collection Time: 09/04/15 10:29 PM  Result Value Ref Range   Sodium 139 135 - 145 mmol/L   Potassium 3.5 3.5 - 5.1 mmol/L   Chloride 108 101 - 111 mmol/L   CO2 22 22 - 32 mmol/L    Glucose, Bld 97 65 - 99 mg/dL   BUN 5 (L) 6 - 20 mg/dL   Creatinine, Ser 0.88 0.44 - 1.00 mg/dL   Calcium 9.2 8.9 - 10.3 mg/dL   Total Protein 7.1 6.5 - 8.1 g/dL   Albumin 4.5 3.5 - 5.0 g/dL   AST 14 (L) 15 - 41 U/L   ALT 10 (L) 14 - 54 U/L   Alkaline Phosphatase 64 38 - 126 U/L   Total Bilirubin 0.5 0.3 - 1.2 mg/dL   GFR calc non Af Amer >60 >60 mL/min   GFR calc Af Amer >60 >60 mL/min    Comment: (NOTE) The eGFR has been calculated using the CKD EPI equation. This calculation has not been validated in all clinical situations. eGFR's persistently <60 mL/min signify possible Chronic Kidney Disease.    Anion gap 9 5 - 15  CBC with Differential     Status: None   Collection Time: 09/04/15 10:29 PM  Result Value Ref Range   WBC 6.3 4.0 - 10.5 K/uL   RBC 4.50 3.87 - 5.11 MIL/uL   Hemoglobin 13.6 12.0 - 15.0 g/dL   HCT 39.8 36.0 - 46.0 %   MCV 88.4 78.0 - 100.0 fL   MCH 30.2 26.0 - 34.0 pg   MCHC 34.2 30.0 - 36.0 g/dL   RDW 12.0 11.5 - 15.5 %   Platelets 279 150 - 400 K/uL   Neutrophils Relative % 46 %   Neutro Abs 3.0 1.7 - 7.7 K/uL   Lymphocytes Relative 42 %   Lymphs Abs 2.6 0.7 - 4.0 K/uL   Monocytes Relative 9 %   Monocytes Absolute 0.6 0.1 - 1.0 K/uL   Eosinophils Relative 2 %   Eosinophils Absolute 0.1 0.0 - 0.7 K/uL   Basophils Relative 1 %   Basophils Absolute 0.0 0.0 - 0.1 K/uL  Ethanol     Status: Abnormal   Collection Time: 09/04/15 10:29 PM  Result Value Ref Range   Alcohol, Ethyl (B) 179 (H) <5 mg/dL    Comment:        LOWEST DETECTABLE LIMIT FOR SERUM ALCOHOL IS 5 mg/dL FOR MEDICAL PURPOSES ONLY     Current Facility-Administered Medications  Medication Dose Route Frequency Provider Last Rate Last Dose  . acetaminophen (TYLENOL) tablet 650 mg  650 mg Oral Q4H PRN Charlann Lange, PA-C   650  mg at 09/05/15 0159  . LORazepam (ATIVAN) tablet 1 mg  1 mg Oral Q8H PRN Charlann Lange, PA-C   1 mg at 09/05/15 0159  . lurasidone (LATUDA) tablet 120 mg  120 mg Oral  QHS Patrecia Pour, NP      . nicotine (NICODERM CQ - dosed in mg/24 hours) patch 21 mg  21 mg Transdermal Daily Shari Upstill, PA-C      . sertraline (ZOLOFT) tablet 100 mg  100 mg Oral BID Patrecia Pour, NP      . traZODone (DESYREL) tablet 100 mg  100 mg Oral QHS Patrecia Pour, NP       Current Outpatient Prescriptions  Medication Sig Dispense Refill  . Lurasidone HCl (LATUDA) 120 MG TABS Take 1 tablet (120 mg total) by mouth at bedtime. Reported on 04/23/2015 30 tablet 0  . sertraline (ZOLOFT) 100 MG tablet Take 1 tablet (100 mg total) by mouth 2 (two) times daily. 60 tablet 0  . traZODone (DESYREL) 100 MG tablet Take 1 tablet (100 mg total) by mouth at bedtime. 30 tablet 0  . VYVANSE 50 MG capsule Take 50 mg by mouth daily.    Marland Kitchen ibuprofen (ADVIL,MOTRIN) 600 MG tablet Take 1 tablet (600 mg total) by mouth every 6 (six) hours. (Patient not taking: Reported on 08/19/2015) 30 tablet 0    Musculoskeletal: Strength & Muscle Tone: within normal limits Gait & Station: normal Patient leans: N/A  Psychiatric Specialty Exam: Physical Exam  Constitutional: She is oriented to person, place, and time. She appears well-developed and well-nourished.  HENT:  Head: Normocephalic.  Neck: Normal range of motion.  Respiratory: Effort normal.  Musculoskeletal: Normal range of motion.  Neurological: She is alert and oriented to person, place, and time.  Skin: Skin is warm and dry.  Psychiatric: Her speech is normal and behavior is normal. Judgment and thought content normal. Cognition and memory are normal. She exhibits a depressed mood.    Review of Systems  Constitutional: Negative.   HENT: Negative.   Eyes: Negative.   Respiratory: Negative.   Cardiovascular: Negative.   Gastrointestinal: Negative.   Genitourinary: Negative.   Musculoskeletal: Negative.   Skin: Negative.   Neurological: Negative.   Endo/Heme/Allergies: Negative.   Psychiatric/Behavioral: Positive for depression and  substance abuse.    Blood pressure (!) 102/43, pulse 85, temperature 98.1 F (36.7 C), temperature source Oral, resp. rate 17, last menstrual period 07/24/2015, SpO2 99 %, not currently breastfeeding.There is no height or weight on file to calculate BMI.  General Appearance: Casual  Eye Contact:  Good  Speech:  Normal Rate  Volume:  Normal  Mood:  Depressed  Affect:  Congruent  Thought Process:  Coherent and Descriptions of Associations: Intact  Orientation:  Full (Time, Place, and Person)  Thought Content:  WDL  Suicidal Thoughts:  No  Homicidal Thoughts:  No  Memory:  Immediate;   Good Recent;   Good Remote;   Good  Judgement:  Fair  Insight:  Fair  Psychomotor Activity:  Normal  Concentration:  Concentration: Good and Attention Span: Good  Recall:  Good  Fund of Knowledge:  Good  Language:  Good  Akathisia:  No  Handed:  Right  AIMS (if indicated):     Assets:  Housing Leisure Time Physical Health Resilience Social Support  ADL's:  Intact  Cognition:  WNL  Sleep:        Treatment Plan Summary: Daily contact with patient to assess and evaluate symptoms and  progress in treatment, Medication management and Plan major depressive disorder, recurrent, mild:  -Crisis stabilization -Medication management:  Continued her medications:  Zoloft 100 mg BID for depression, Latuda 120 mg at bedtime for mood, and Trazodone 100 mg at bedtime for sleep. -Individual and substance abuse counseling -Rx provided  Disposition: No evidence of imminent risk to self or others at present.    Waylan Boga, NP 09/05/2015 9:56 AM
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
## 2016-01-20 ENCOUNTER — Ambulatory Visit (INDEPENDENT_AMBULATORY_CARE_PROVIDER_SITE_OTHER): Payer: Medicare Other | Admitting: Family Medicine

## 2016-01-20 VITALS — BP 114/90 | HR 117 | Temp 98.3°F | Resp 16 | Ht 68.5 in | Wt 230.4 lb

## 2016-01-20 DIAGNOSIS — N926 Irregular menstruation, unspecified: Secondary | ICD-10-CM

## 2016-01-20 LAB — POCT URINE PREGNANCY: Preg Test, Ur: NEGATIVE

## 2016-01-20 IMAGING — US US OB DETAIL+14 WK
2 series · 12 of 28 positions shown · non-contrast
Comparison: none

[Series 1: us ob comp +14 wk · 1 of 10 slices shown (1 of 2)]
[im 5/10]
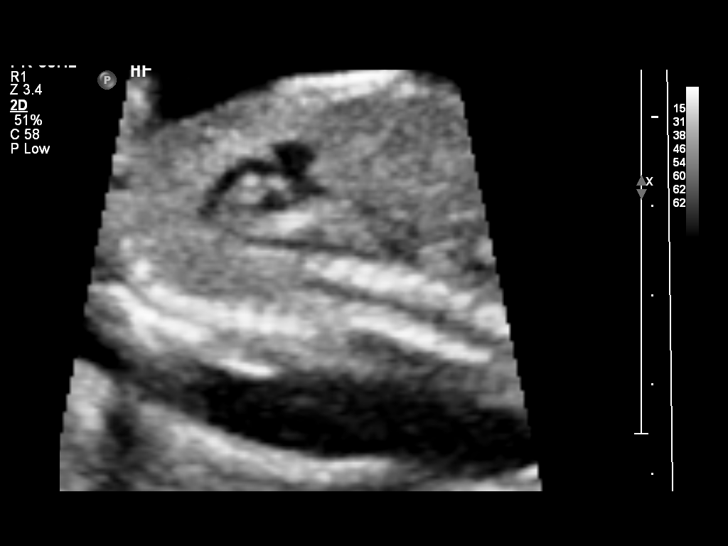

[Series 1: us ob comp +14 wk · 11 of 82 slices shown (2 of 2)]
[im 1/82]
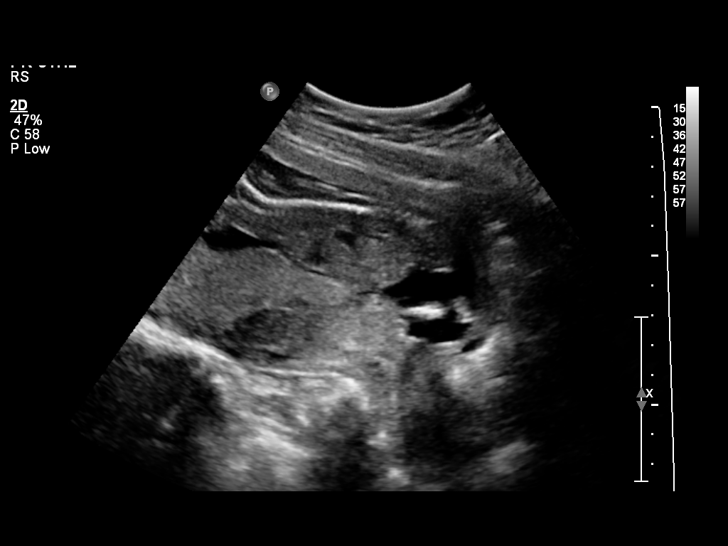
[im 7/82]
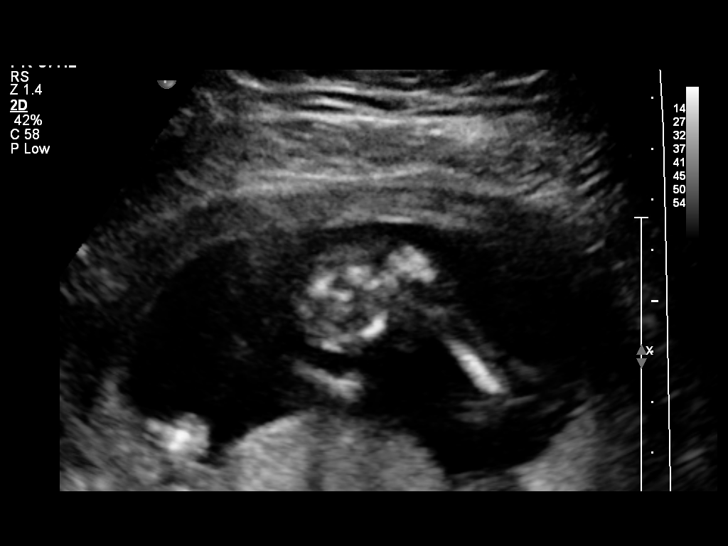
[im 17/82]
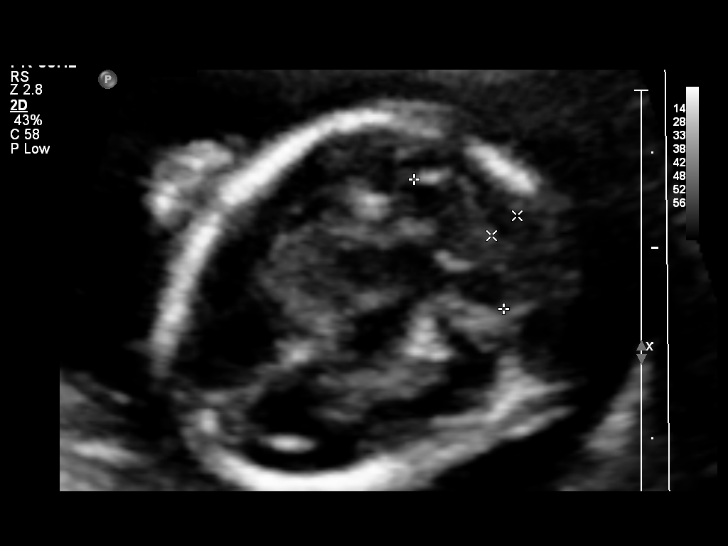
[im 24/82]
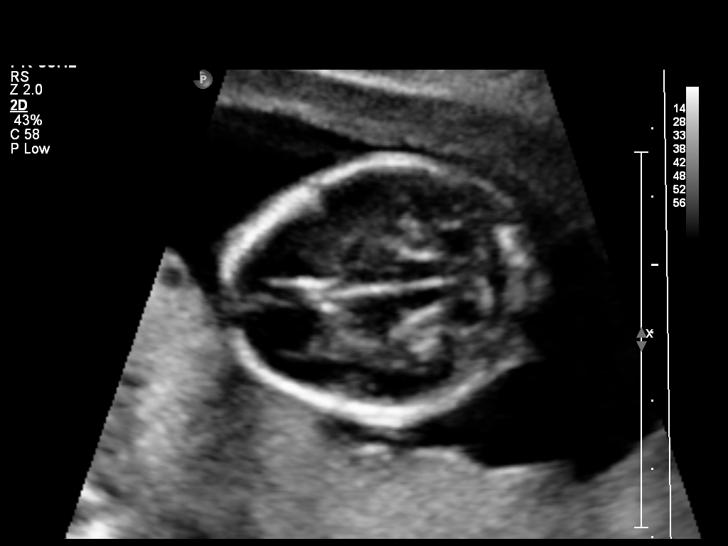
[im 31/82]
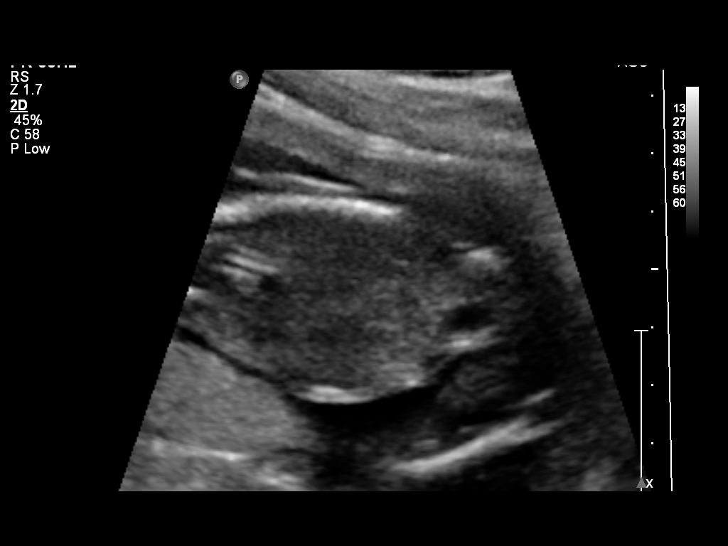
[im 41/82]
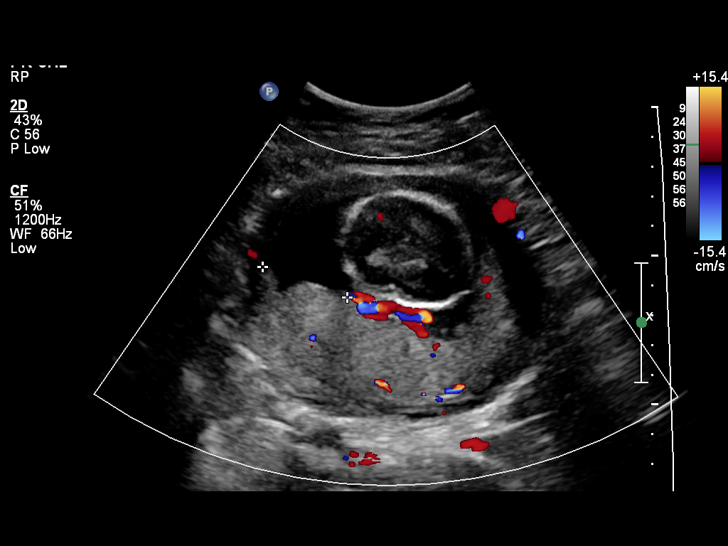
[im 48/82]
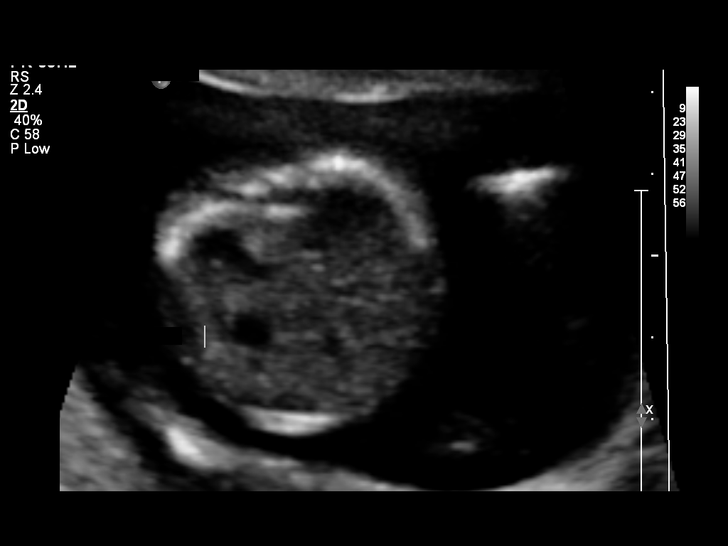
[im 55/82]
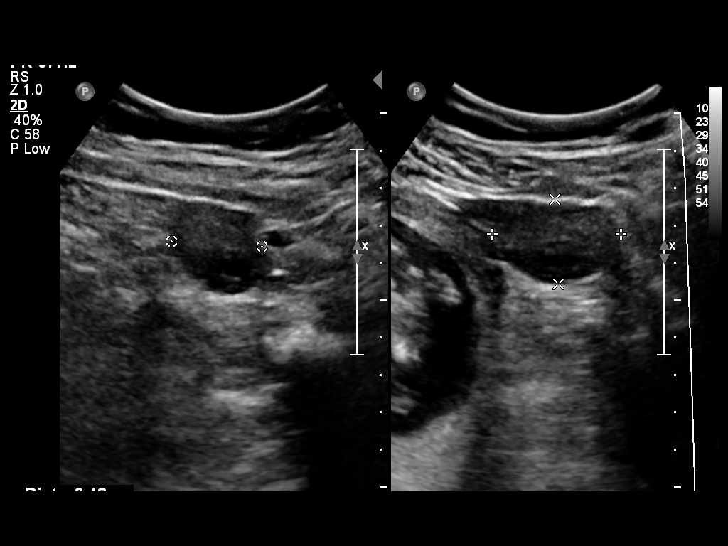
[im 65/82]
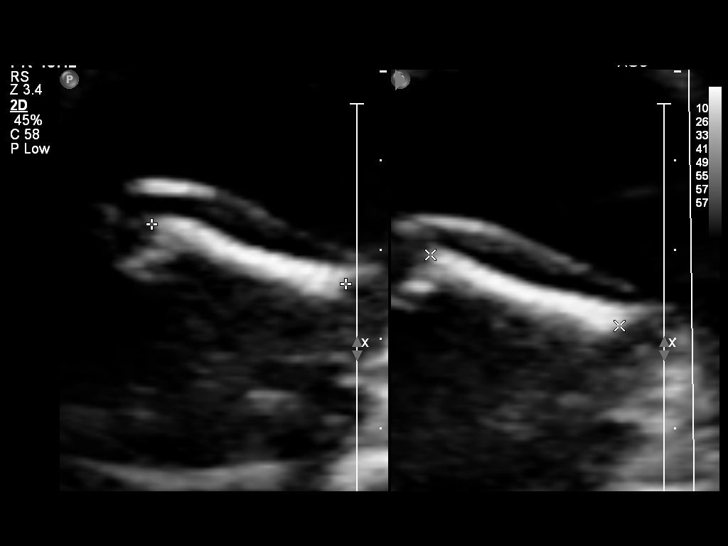
[im 71/82]
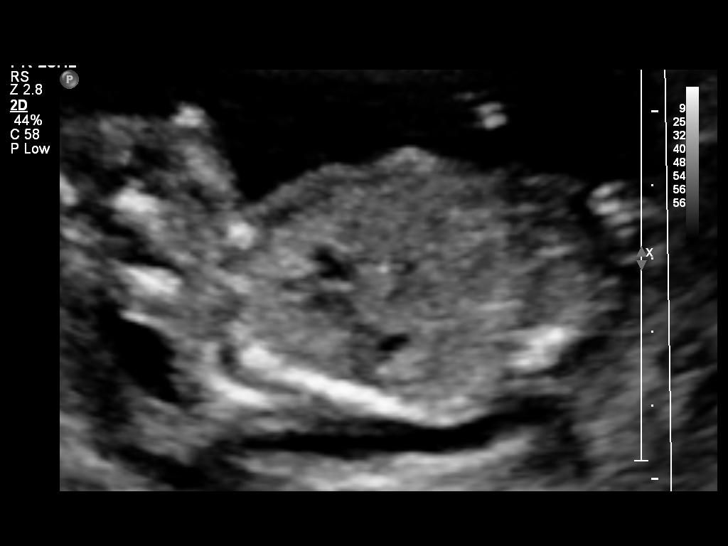
[im 78/82]
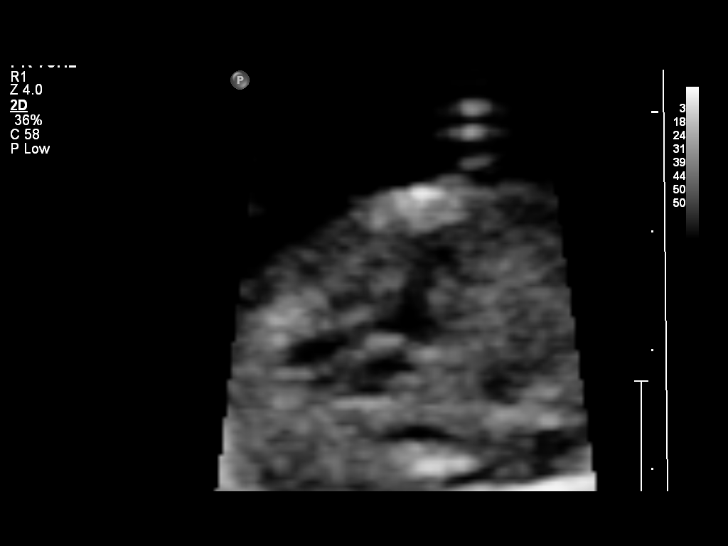

[12 of 28 positions shown; findings below may reference images not displayed]

OBSTETRICS REPORT
                      (Signed Final 08/29/2013 [DATE])

Service(s) Provided

 US OB DETAIL + 14 WK                                  76811.0
Indications

 Detailed fetal anatomic survey
 Drug dependence complicating pregnancy -
 Seroquel
 Unsure of LMP;  Establish Gestational [AGE]
Fetal Evaluation

 Num Of Fetuses:    1
 Fetal Heart Rate:  147                          bpm
 Cardiac Activity:  Observed
 Presentation:      Breech
 Placenta:          Posterior, above cervical
                    os
 P. Cord            Marginal insertion
 Insertion:

 Amniotic Fluid
 AFI FV:      Subjectively within normal limits
                                             Larg Pckt:     3.8  cm
Biometry

 BPD:     35.9  mm     G. Age:  17w 0d                CI:        71.77   70 - 86
                                                      FL/HC:      16.8   14.6 -

 HC:     134.9  mm     G. Age:  17w 0d       39  %    HC/AC:      1.19   1.07 -

 AC:     113.2  mm     G. Age:  17w 1d       54  %    FL/BPD:
 FL:      22.6  mm     G. Age:  16w 5d       37  %    FL/AC:      20.0   20 - 24
 CER:     16.4  mm     G. Age:  16w 3d       37  %
 NFT:     2.84  mm

 Est. FW:     176  gm      0 lb 6 oz     55  %
Gestational Age

 LMP:           16w 2d        Date:  05/07/13                 EDD:   02/11/14
 U/S Today:     17w 0d                                        EDD:   02/06/14
 Best:          17w 0d     Det. By:  U/S (08/29/13)           EDD:   02/06/14
Anatomy

 Cranium:          Appears normal         Aortic Arch:      Appears normal
 Fetal Cavum:      Appears normal         Ductal Arch:      Appears normal
 Ventricles:       Appears normal         Diaphragm:        Appears normal
 Choroid Plexus:   Appears normal         Stomach:          Appears normal, left
                                                            sided
 Cerebellum:       Appears normal         Abdomen:          Appears normal
 Posterior Fossa:  Appears normal         Abdominal Wall:   Appears nml (cord
                                                            insert, abd wall)
 Nuchal Fold:      Appears normal         Cord Vessels:     Appears normal (3
                                                            vessel cord)
 Face:             Appears normal         Kidneys:          Appear normal
                   (orbits and profile)
 Lips:             Appears normal         Bladder:          Appears normal
 Heart:            Limited                Spine:            Appears normal
 RVOT:             Appears normal         Lower             Appears normal
                                          Extremities:
 LVOT:             Appears normal         Upper             Appears normal
                                          Extremities:

 Other:  Fetus appears to be a female. 5th digit visualized. Rt heel visualized.
         Nasal bone visualized. Technically difficult due to early gestational
         age.
Targeted Anatomy

 Fetal Central Nervous System
 Cisterna Magna:
Cervix Uterus Adnexa

 Cervical Length:    3.93     cm

 Cervix:       Normal appearance by transabdominal scan.
 Uterus:       No abnormality visualized.
 Cul De Sac:   No free fluid seen.

 Left Ovary:    Within normal limits.
 Right Ovary:   Not visualized.
 Adnexa:     No abnormality visualized.
Impression

 Single IUP at 17w 0d
 Normal fetal anatomic survey
 Somewhat limited views of the 4CH obtained due to early
 gestational age
 The remainder of the anatomy appears normal
 No markers associated with aneuploidy noted
 Normal amniotic fluid volume
Recommendations

 Recommend follow-up ultrasound examination in 4 weeks to
 reevaluate the fetal heart

 questions or concerns.

## 2016-01-20 NOTE — Patient Instructions (Addendum)
Your pregnancy test is negative.   IF you received an x-ray today, you will receive an invoice from [facility]. Please contact [facility] at [phone] with questions or concerns regarding your invoice.   IF you received labwork today, you will receive an invoice from [facility]. Please contact [facility] at [phone] with questions or concerns regarding your invoice.   Our billing staff will not be able to assist you with questions regarding bills from these companies.  You will be contacted with the lab results as soon as they are available. The fastest way to get your results is to activate your My Chart account. Instructions are located on the last page of this paperwork. If you have not heard from us regarding the results in 2 weeks, please contact this office.      Pregnancy Test Information WHAT IS A PREGNANCY TEST? A pregnancy test is used to detect the presence of human chorionic gonadotropin (hCG) in a sample of your urine or blood. hCG is a hormone produced by the cells of the placenta. The placenta is the organ that forms to nourish and support a developing baby. This test requires a sample of either blood or urine. A pregnancy test determines whether you are pregnant or not. HOW ARE PREGNANCY TESTS DONE? Pregnancy tests are done using a home pregnancy test or having a blood or urine test done at your health care provider's office. Home pregnancy tests require a urine sample.  Most kits use a plastic testing device with a strip of paper that indicates whether there is hCG in your urine.  Follow the test instructions very carefully.  After you urinate on the test stick, markings will appear to let you know whether you are pregnant.  For best results, use your first urine of the morning. That is when the concentration of hCG is highest. Having a blood test to check for pregnancy requires a sample of blood drawn from a vein in  your hand or arm. Your health care provider will send your sample to a lab for testing. Results of a pregnancy test will be positive or negative. IS ONE TYPE OF PREGNANCY TEST BETTER THAN ANOTHER? In some cases, a blood test will return a positive result even if a urine test was negative because blood tests are more sensitive. This means blood tests can detect hCG earlier than home pregnancy tests. HOW ACCURATE ARE HOME PREGNANCY TESTS? Both types of pregnancy tests are very accurate.  A blood test is about 98% accurate.  When you are far enough along in your pregnancy and when used correctly, home pregnancy tests are equally accurate. CAN ANYTHING INTERFERE WITH HOME PREGNANCY TEST RESULTS? It is possible for certain conditions to cause an inaccurate test result (false positive or falsenegative).  A false positive is a positive test result when you are not pregnant. This can happen if you:  Are taking certain medicines, including anticonvulsants or tranquilizers.  Have certain proteins in your blood.  A false negative is a negative test result when you are pregnant. This can happen if you:  Took the test before there was enough hCG to detect. A pregnancy test will not be positive in most women until 3-4 weeks after conception.  Drank a lot of liquid before the test. Diluted urine samples can sometimes give an inaccurate result.  Take certain medicines, such as water pills (diuretics) or some antihistamines. WHAT SHOULD I DO IF I HAVE A POSITIVE PREGNANCY TEST? If you  have a positive pregnancy test, schedule an appointment with your health care provider. You might need additional testing to confirm the pregnancy. In the meantime, begin taking a prenatal vitamin, stop smoking, stop drinking alcohol, and do not use street drugs. Talk to your health care provider about how to take care of yourself during your pregnancy. Ask about what to expect from the care you will need throughout  pregnancy (prenatal care). This information is not intended to replace advice given to you by your health care provider. Make sure you discuss any questions you have with your health care provider. Document Released: 01/27/2003 Document Revised: 09/30/2015 Document Reviewed: 05/21/2013 Elsevier Interactive Patient Education  2017 [employer]

## 2016-01-20 NOTE — Progress Notes (Signed)
30 year old female , with a hx of multiple mental health problems : Depression, Bipolar, personality disorder presents for a pregnancy test only. She reports that she has been taking home pregnancy test for last two weeks. Reports that she has receive "faint positives" and feels that she is actually pregnant although she hasn't missed a period. Feels her breast have increased in size and she reports experiencing lower abdominal cramping. Her last menstrual period 12/29/2015.  Vitals:   01/20/16 1457  BP: 114/90  Pulse: (!) 117  Resp: 16  Temp: 98.3 F (36.8 C)   Pt is fixated on the fact that she is pregnant in spite of multiple negative pregnancy tests. I advised that she take a break from home pregnancy test. Wait at least one week, if your menstrual cycle hasn't began, obtain a serum HCG test.  Godfrey PickKimberly S. Tiburcio PeaHarris, MSN, FNP-C [facility]

## 2016-01-27 ENCOUNTER — Ambulatory Visit (INDEPENDENT_AMBULATORY_CARE_PROVIDER_SITE_OTHER): Payer: Medicare Other | Admitting: Physician Assistant

## 2016-01-27 VITALS — BP 120/94 | HR 107 | Temp 98.4°F | Resp 18 | Ht 68.5 in | Wt 226.6 lb

## 2016-01-27 DIAGNOSIS — Z3200 Encounter for pregnancy test, result unknown: Secondary | ICD-10-CM | POA: Diagnosis not present

## 2016-01-27 DIAGNOSIS — N920 Excessive and frequent menstruation with regular cycle: Secondary | ICD-10-CM

## 2016-01-27 LAB — POCT URINE PREGNANCY: Preg Test, Ur: NEGATIVE

## 2016-01-27 NOTE — Patient Instructions (Addendum)
I will contact you with your lab results.  Thank you for coming in today. I hope you feel we met your needs.  Feel free to call [facility] if you have any questions or further requests.  Please consider signing up for MyChart if you do not already have it, as this is a great way to communicate with me.  Best,  Whitney McVey, PA-C   IF you received an x-ray today, you will receive an invoice from [facility]. Please contact [facility] at [phone] with questions or concerns regarding your invoice.   IF you received labwork today, you will receive an invoice from [facility]. Please contact [facility] at [phone] with questions or concerns regarding your invoice.   Our billing staff will not be able to assist you with questions regarding bills from these companies.  You will be contacted with the lab results as soon as they are available. The fastest way to get your results is to activate your My Chart account. Instructions are located on the last page of this paperwork. If you have not heard from [place] regarding the results in 2 weeks, please contact this office.

## 2016-01-27 NOTE — Progress Notes (Signed)
Nichole Lopez  MRN: [id number] DOB: 1985/07/18  PCP: Dorrene GermanEdwin A Avbuere, MD  Subjective:  Pt is a 30 year old G3P2 female PMH depression, bipolar, PTSD, Substance abuse disorder, asthma, who presents to clinic for pap smear, pregnancy test. She was here 7 days ago, tested for pregnancy, which was negative.  She has taken "a lot" of home pregnancy tests all of which have been negative, however she thinks they were false. + Spotting. Her periods are on time, however she c/o spotting and her menstrual blood is "pink". This is the first month this has happened. LMP started Nov 21. Spotting on Dec 17. Last coitus Nov 28. Ovulation Dec 3.  Her periods are usually late, so this is concerning to her that she is on time. Her cycles are 26-days. She is worried about implantation bleeding.  She is taking prenatal vitamins.  Her mood has varied a lot recently. She does not normally have PMS symptoms. She has been having dreams about holding a baby.  Normal vaginal delivery 7 months ago.  + Decreased urination.  Denies burning with urination, increased frequency, increased urge, blood in urine, fever, chills, vaginal itching, vaginal burning, pelvic pain.     Review of Systems  Constitutional: Negative for chills, fatigue and fever.  Respiratory: Negative for cough, shortness of breath and wheezing.   Cardiovascular: Negative for chest pain and palpitations.  Gastrointestinal: Negative for abdominal pain, diarrhea, nausea and vomiting.  Genitourinary: Positive for vaginal bleeding. Negative for decreased urine volume, difficulty urinating, dyspareunia, dysuria, enuresis, flank pain, frequency, hematuria, menstrual problem, pelvic pain, urgency, vaginal discharge and vaginal pain.  Musculoskeletal: Negative for back pain.  Neurological: Negative for dizziness, weakness, light-headedness and headaches.    Patient Active Problem List   Diagnosis Date Noted  . Major depressive disorder, recurrent  episode, mild (HCC) 08/20/2015  . NSVD (normal spontaneous vaginal delivery) 06/14/2015  . PROM (premature rupture of membranes) 06/13/2015  . Tobacco use disorder 05/07/2015  . Social problems, antepartum 03/12/2015  . Drug abuse during pregnancy 01/12/2015  . Alcohol consumption during pregnancy in first trimester 12/29/2014  . Medication exposure during first trimester of pregnancy 12/29/2014  . Supervision of high risk pregnancy, antepartum 12/04/2014  . Major depressive disorder, recurrent episode, severe, without mention of psychotic behavior 11/30/2012  . Bipolar disorder, current episode manic w/o psychotic features, severe (HCC) 06/28/2011    Class: Acute  . Alcohol dependence, continuous (HCC) 02/26/2011  . Substance-induced disorder (HCC) 02/26/2011  . Post traumatic stress disorder (PTSD) 02/26/2011  . Borderline personality disorder 02/26/2011    Current Outpatient Prescriptions on File Prior to Visit  Medication Sig Dispense Refill  . ALPRAZolam (XANAX) 0.25 MG tablet Take 0.25 mg by mouth 3 (three) times daily as needed for anxiety.    . Lurasidone HCl (LATUDA) 120 MG TABS Take 1 tablet (120 mg total) by mouth at bedtime. Reported on 04/23/2015 30 tablet 0  . traZODone (DESYREL) 100 MG tablet Take 1 tablet (100 mg total) by mouth at bedtime. 30 tablet 0  . VYVANSE 50 MG capsule Take 50 mg by mouth daily.     No current facility-administered medications on file prior to visit.     No Known Allergies   Objective:  BP (!) 120/94   Pulse (!) 107   Temp 98.4 F (36.9 C) (Oral)   Resp 18   Ht 5' 8.5" (1.74 m)   Wt 226 lb 9.6 oz (102.8 kg)   LMP 01/24/2016   SpO2  99%   BMI 33.95 kg/m   Physical Exam  Constitutional: She is oriented to person, place, and time and well-developed, well-nourished, and in no distress. No distress.  Cardiovascular: Normal rate, regular rhythm and normal heart sounds.   Pulmonary/Chest: Effort normal. No respiratory distress.    Abdominal: Soft. Normal appearance. There is no tenderness.  Neurological: She is alert and oriented to person, place, and time. GCS score is 15.  Skin: Skin is warm and dry.  Psychiatric: Mood, memory, affect and judgment normal.  Vitals reviewed.   Assessment and Plan :  1. Spotting 2. Pregnancy examination or test, pregnancy unconfirmed - POCT urine pregnancy - Beta HCG, Quant - After negative urine preg, patient insisted on serum hcg. Discussed with patient this could be a normal period, even though it is a "light period". It is very early to test for pregnancy. She should wait another week or two. Advised continued use of prenatal vitamins. RTC if symptoms persist.    Marco CollieWhitney Wilmarie Sparlin, PA-C  [facility] 01/27/2016 3:07 PM

## 2016-01-28 ENCOUNTER — Telehealth: Payer: Self-pay

## 2016-01-28 LAB — BETA HCG QUANT (REF LAB): hCG Quant: <1 m[IU]/mL

## 2016-01-28 NOTE — Telephone Encounter (Signed)
Patient is having cramping, bleeding, and dark in breast. Patient thinks she might be having a miscarraige. Please call patient soon as possible!!!  [phone]

## 2016-01-28 NOTE — Telephone Encounter (Signed)
Pt is looking for results of her pregnancy test   Best number [phone]

## 2016-01-28 NOTE — Telephone Encounter (Signed)
Pt given negative pregnancy test results  States, bleeding and cramping has resolved [facility] if worsening abdominal pain, n,v, bleeding Verbalized understanding

## 2016-01-30 NOTE — Progress Notes (Signed)
Please call patient and let her know her serum (blood) pregnancy test is negative.

## 2016-02-15 ENCOUNTER — Ambulatory Visit (INDEPENDENT_AMBULATORY_CARE_PROVIDER_SITE_OTHER): Payer: Medicare Other | Admitting: Physician Assistant

## 2016-02-15 ENCOUNTER — Encounter: Payer: Self-pay | Admitting: Physician Assistant

## 2016-02-15 VITALS — BP 135/90 | HR 106 | Temp 98.0°F | Resp 16 | Ht 68.5 in | Wt 225.0 lb

## 2016-02-15 DIAGNOSIS — Z3200 Encounter for pregnancy test, result unknown: Secondary | ICD-10-CM | POA: Diagnosis not present

## 2016-02-15 DIAGNOSIS — Z3201 Encounter for pregnancy test, result positive: Secondary | ICD-10-CM

## 2016-02-15 NOTE — Patient Instructions (Addendum)
Today's pregnancy test is positive.  Take a Prenatal Vitamin each day. You can purchase one over-the-counter. These can sometimes cause nausea and constipation. If you do not tolerate them, you may take a children's multi-vitamin tablet daily until you can discuss other options with your obstetrician.  Drink at least 64 ounces of water each day. I recommend that you obtain a 32 ounce water bottle. Fill it each morning, and drink that amount by lunch time. Refill the bottle and drink that amount by supper. This will help you get enough water, and reduce the frequency you get up during the night to urinate.  You cannot take ibuprofen during your pregnancy but you can take tylenol. All meat and fish needs to be cooked well done. No deli meat or soft cheeses. If you develop abdominal pain worse than a period cramp or vaginal bleeding, return to be seen.   [facility]  [facility] [place] - [phone] [facility] [phone] [facility] [phone] [facility] - [phone]  Get a book to read that will tell you what's happening during the different stages of your pregnancy.  You may want to consider one of these: Your Pregnancy Week by Week by Lindwood QuaGlade Curtis What to Expect When You're Expecting by [facility] - Easteidi Murkoff, Harrington ChallengerArlene Eisenberg and Sunnie NielsenSandee Hathaway The Complete Pregnancy Workbook by Dorita SciaraPhilip Sloane, MD, Domingo SepSalli Benedict, MPH and Macario CarlsMelanie Mintzer, MD   IF you received an x-ray today, you will receive an invoice from [facility]. Please contact [facility] at [phone] with questions or concerns regarding your invoice.   IF you received labwork today, you will receive an invoice from [facility]. Please contact [facility] at [phone] with questions or concerns regarding your invoice.   Our billing staff will not be able to assist you with questions regarding bills from these companies.  You will be contacted with the lab results as soon as they are available.  The fastest way to get your results is to activate your My Chart account. Instructions are located on the last page of this paperwork. If you have not heard from us regarding the results in 2 weeks, please contact this office.

## 2016-02-15 NOTE — Progress Notes (Signed)
Nichole Lopez  MRN: [id number] DOB: 02-Mar-1985  PCP: Dorrene German, MD  Subjective:  Pt is a 31 yo female, G1P2, PMH alcohol dependence, substance-induced disorder, PTSD, MDD, borderline personality disorder, drug abuse during pregnancy, alcohol use during pregnancy, presents to clinic for possible pregnancy. This is her third time here for the same.  She has five +home pregnancy tests. Her first test ws taken on Jan 5.  Notes spotting on12/17, pregnancy tests at that time were negative. She has "felt pregnant" for a few weeks now. +breast tenderness, nausea. She is taking prenatal vitamins.  Denies drinking, Denies drug use.  She is taking Vyvanse.  Previous [employer].  Denies abdominal pain, cramping, abnormal bleeding, lightheadedness.  Review of Systems  Constitutional: Negative for chills, diaphoresis, fatigue and fever.  HENT: Negative for congestion, postnasal drip, rhinorrhea, sinus pressure, sneezing and sore throat.   Respiratory: Negative for cough, chest tightness, shortness of breath and wheezing.   Cardiovascular: Negative for chest pain and palpitations.  Gastrointestinal: Negative for abdominal pain, diarrhea, nausea and vomiting.  Neurological: Negative for weakness, light-headedness and headaches.    Patient Active Problem List   Diagnosis Date Noted  . Major depressive disorder, recurrent episode, mild (HCC) 08/20/2015  . NSVD (normal spontaneous vaginal delivery) 06/14/2015  . PROM (premature rupture of membranes) 06/13/2015  . Tobacco use disorder 05/07/2015  . Social problems, antepartum 03/12/2015  . Drug abuse during pregnancy 01/12/2015  . Alcohol consumption during pregnancy in first trimester 12/29/2014  . Medication exposure during first trimester of pregnancy 12/29/2014  . Supervision of high risk pregnancy, antepartum 12/04/2014  . Major depressive disorder, recurrent episode, severe, without mention of psychotic behavior  11/30/2012  . Bipolar disorder, current episode manic w/o psychotic features, severe (HCC) 06/28/2011    Class: Acute  . Alcohol dependence, continuous (HCC) 02/26/2011  . Substance-induced disorder (HCC) 02/26/2011  . Post traumatic stress disorder (PTSD) 02/26/2011  . Borderline personality disorder 02/26/2011    Current Outpatient Prescriptions on File Prior to Visit  Medication Sig Dispense Refill  . Lurasidone HCl (LATUDA) 120 MG TABS Take 1 tablet (120 mg total) by mouth at bedtime. Reported on 04/23/2015 30 tablet 0  . traZODone (DESYREL) 100 MG tablet Take 1 tablet (100 mg total) by mouth at bedtime. 30 tablet 0  . VYVANSE 50 MG capsule Take 50 mg by mouth daily.    Marland Kitchen ALPRAZolam (XANAX) 0.25 MG tablet Take 0.25 mg by mouth 3 (three) times daily as needed for anxiety.     No current facility-administered medications on file prior to visit.     No Known Allergies   Objective:  BP 135/90 (BP Location: Right Arm, Patient Position: Sitting, Cuff Size: Small)   Temp 98 F (36.7 C) (Oral)   Resp 16   Ht 5' 8.5" (1.74 m)   Wt 225 lb (102.1 kg)   LMP 01/24/2016   SpO2 100%   BMI 33.71 kg/m   Physical Exam  Constitutional: She is oriented to person, place, and time and well-developed, well-nourished, and in no distress. No distress.  Cardiovascular: Normal rate, regular rhythm and normal heart sounds.   Abdominal: Soft. There is no tenderness.  Neurological: She is alert and oriented to person, place, and time. GCS score is 15.  Skin: Skin is warm and dry.  Psychiatric: Mood, memory, affect and judgment normal.  Vitals reviewed.  Results for orders placed or performed in visit on 02/15/16  POCT urine pregnancy  Result Value Ref Range   Preg Test, Ur Positive (A) Negative    Assessment and Plan :  1. Possible pregnancy 2. Pregnancy test positive - POCT urine pregnancy - Anticipatory guidance given: prenatal vitamins, hydrate, no drugs, no alcohol. Information given for  OBGYN facilities.    Marco CollieWhitney Aliviyah Malanga, PA-C  [facility] 02/15/2016 11:40 AM

## 2016-02-16 LAB — POCT URINE PREGNANCY: Preg Test, Ur: POSITIVE — AB

## 2016-02-20 ENCOUNTER — Encounter (HOSPITAL_COMMUNITY): Payer: Self-pay | Admitting: *Deleted

## 2016-02-20 ENCOUNTER — Inpatient Hospital Stay (HOSPITAL_COMMUNITY)
Admission: AD | Admit: 2016-02-20 | Discharge: 2016-02-20 | Disposition: A | Discharge status: Home / Self Care | Payer: Medicare Other | Source: Ambulatory Visit | Attending: Family Medicine | Admitting: Family Medicine

## 2016-02-20 ENCOUNTER — Inpatient Hospital Stay (HOSPITAL_COMMUNITY): Payer: Medicare Other

## 2016-02-20 DIAGNOSIS — O26892 Other specified pregnancy related conditions, second trimester: Secondary | ICD-10-CM | POA: Insufficient documentation

## 2016-02-20 DIAGNOSIS — Z3A01 Less than 8 weeks gestation of pregnancy: Secondary | ICD-10-CM | POA: Diagnosis not present

## 2016-02-20 DIAGNOSIS — O3680X0 Pregnancy with inconclusive fetal viability, not applicable or unspecified: Secondary | ICD-10-CM | POA: Diagnosis not present

## 2016-02-20 DIAGNOSIS — O99331 Smoking (tobacco) complicating pregnancy, first trimester: Secondary | ICD-10-CM | POA: Insufficient documentation

## 2016-02-20 DIAGNOSIS — F17210 Nicotine dependence, cigarettes, uncomplicated: Secondary | ICD-10-CM | POA: Diagnosis not present

## 2016-02-20 DIAGNOSIS — R109 Unspecified abdominal pain: Secondary | ICD-10-CM

## 2016-02-20 DIAGNOSIS — R1031 Right lower quadrant pain: Secondary | ICD-10-CM | POA: Diagnosis not present

## 2016-02-20 DIAGNOSIS — O26899 Other specified pregnancy related conditions, unspecified trimester: Secondary | ICD-10-CM

## 2016-02-20 LAB — CBC
HCT: 37.2 % (ref 36.0–46.0)
Hemoglobin: 13 g/dL (ref 12.0–15.0)
MCH: 30 pg (ref 26.0–34.0)
MCHC: 34.9 g/dL (ref 30.0–36.0)
MCV: 85.9 fL (ref 78.0–100.0)
PLATELETS: 271 10*3/uL (ref 150–400)
RBC: 4.33 MIL/uL (ref 3.87–5.11)
RDW: 12.4 % (ref 11.5–15.5)
WBC: 6.3 10*3/uL (ref 4.0–10.5)

## 2016-02-20 LAB — URINALYSIS, ROUTINE W REFLEX MICROSCOPIC
Bilirubin Urine: NEGATIVE
Glucose, UA: NEGATIVE mg/dL
Hgb urine dipstick: NEGATIVE
KETONES UR: 5 mg/dL — AB
LEUKOCYTES UA: NEGATIVE
NITRITE: NEGATIVE
PH: 5 (ref 5.0–8.0)
Protein, ur: NEGATIVE mg/dL
Specific Gravity, Urine: 1.027 (ref 1.005–1.030)

## 2016-02-20 LAB — POCT PREGNANCY, URINE: Preg Test, Ur: POSITIVE — AB

## 2016-02-20 LAB — WET PREP, GENITAL
Sperm: NONE SEEN
TRICH WET PREP: NONE SEEN
Yeast Wet Prep HPF POC: NONE SEEN

## 2016-02-20 LAB — HCG, QUANTITATIVE, PREGNANCY: HCG, BETA CHAIN, QUANT, S: 407 m[IU]/mL — AB (ref <5)

## 2016-02-20 NOTE — Discharge Instructions (Signed)
Abdominal Pain During Pregnancy °Belly (abdominal) pain is common during pregnancy. Most of the time, it is not a serious problem. Other times, it can be a sign that something is wrong with the pregnancy. Always tell your doctor if you have belly pain. °Follow these instructions at home: °Monitor your belly pain for any changes. The following actions may help you feel better: °· Do not have sex (intercourse) or put anything in your vagina until you feel better. °· Rest until your pain stops. °· Drink clear fluids if you feel sick to your stomach (nauseous). Do not eat solid food until you feel better. °· Only take medicine as told by your doctor. °· Keep all doctor visits as told. °Get help right away if: °· You are bleeding, leaking fluid, or pieces of tissue come out of your vagina. °· You have more pain or cramping. °· You keep throwing up (vomiting). °· You have pain when you pee (urinate) or have blood in your pee. °· You have a fever. °· You do not feel your baby moving as much. °· You feel very weak or feel like passing out. °· You have trouble breathing, with or without belly pain. °· You have a very bad headache and belly pain. °· You have fluid leaking from your vagina and belly pain. °· You keep having watery poop (diarrhea). °· Your belly pain does not go away after resting, or the pain gets worse. °This information is not intended to replace advice given to you by your health care provider. Make sure you discuss any questions you have with your health care provider. °Document Released: 01/12/2009 Document Revised: 09/02/2015 Document Reviewed: 08/23/2012 °Elsevier Interactive Patient Education © 2017 [employer]vier Inc. ° °

## 2016-02-20 NOTE — MAU Provider Note (Signed)
History     CSN: [id number]  Arrival date and time: 02/20/16 1443   First Provider Initiated Contact with Patient 02/20/16 1525      Chief Complaint  Patient presents with  . Abdominal Cramping   [id number] @ 2242w6d by unsure LMP here with abdominal cramping. She also reports sharp pain in RLQ today. She is unsure of LMP because she had only spotting last month. She reports thin, white, watery vaginal discharge. She reports 2 sexual partners currently. She has not used anything for the cramping. There are no aggrevating or alleviating factors.     Abdominal Cramping  This is a new problem. The current episode started in the past 7 days. The onset quality is sudden. The problem occurs intermittently. The most recent episode lasted 3 days. The problem has been unchanged. The pain is located in the generalized abdominal region. The pain is at a severity of 6/10. The quality of the pain is cramping. Pertinent negatives include no constipation, diarrhea, dysuria, fever, frequency or vomiting. Nothing aggravates the pain. The pain is relieved by nothing. She has tried nothing for the symptoms.    OB History    Gravida Para Term Preterm AB Living   4 2 2   1 2    SAB TAB Ectopic Multiple Live Births   1     0 2      Past Medical History:  Diagnosis Date  . Asthma   . Bipolar 1 disorder (HCC)   . Deliberate self-cutting   . Depression   . Mental disorder PTSD Depression Schizophrenic disorder,  anxiety  . PTSD (post-traumatic stress disorder)   . Suicidal ideation     Past Surgical History:  Procedure Laterality Date  . FOOT SURGERY      Family History  Problem Relation Age of Onset  . Depression Maternal Uncle   . Diabetes Other   . Congenital heart disease Daughter     no surgical correction required at this time    Social History  Substance Use Topics  . Smoking status: Current Every Day Smoker    Packs/day: 1.00    Years: 10.00    Types: Cigarettes  . Smokeless  tobacco: Never Used  . Alcohol use Yes     Comment: socially    Allergies: No Known Allergies  Prescriptions Prior to Admission  Medication Sig Dispense Refill Last Dose  . ALPRAZolam (XANAX) 0.25 MG tablet Take 0.25 mg by mouth 3 (three) times daily as needed for anxiety.   Not Taking  . Lurasidone HCl (LATUDA) 120 MG TABS Take 1 tablet (120 mg total) by mouth at bedtime. Reported on 04/23/2015 30 tablet 0 Taking  . traZODone (DESYREL) 100 MG tablet Take 1 tablet (100 mg total) by mouth at bedtime. 30 tablet 0 Taking  . VYVANSE 50 MG capsule Take 50 mg by mouth daily.   Taking    Review of Systems  Constitutional: Negative for chills and fever.  Gastrointestinal: Positive for abdominal pain. Negative for constipation, diarrhea and vomiting.  Genitourinary: Positive for vaginal discharge (white, thin, watery). Negative for dysuria, frequency, urgency and vaginal bleeding.   Physical Exam   Blood pressure 135/77, pulse 99, resp. rate 16, last menstrual period 01/24/2016, not currently breastfeeding.  Physical Exam  Nursing note and vitals reviewed. Constitutional: She is oriented to person, place, and time. She appears well-developed and well-nourished. No distress (appears comfortable).  HENT:  Head: Normocephalic.  Neck: Normal range of motion.  Cardiovascular: Normal rate.  Respiratory: Effort normal.  GI: Soft. She exhibits no distension and no mass. There is no tenderness. There is no rebound and no guarding.  Genitourinary:  Genitourinary Comments: External: no lesions or erythema Vagina: rugated, parous, thin, white discharge Uterus: non enlarged, anteverted, non tender, no CMT Adnexae: no masses, no tenderness left, no tenderness right   Musculoskeletal: Normal range of motion.  Neurological: She is alert and oriented to person, place, and time.  Skin: Skin is warm and dry.  Psychiatric: She has a normal mood and affect.   Results for orders placed or performed  during the hospital encounter of 02/20/16 (from the past 24 hour(s))  Urinalysis, Routine w reflex microscopic     Status: Abnormal   Collection Time: 02/20/16  2:54 PM  Result Value Ref Range   Color, Urine YELLOW YELLOW   APPearance HAZY (A) CLEAR   Specific Gravity, Urine 1.027 1.005 - 1.030   pH 5.0 5.0 - 8.0   Glucose, UA NEGATIVE NEGATIVE mg/dL   Hgb urine dipstick NEGATIVE NEGATIVE   Bilirubin Urine NEGATIVE NEGATIVE   Ketones, ur 5 (A) NEGATIVE mg/dL   Protein, ur NEGATIVE NEGATIVE mg/dL   Nitrite NEGATIVE NEGATIVE   Leukocytes, UA NEGATIVE NEGATIVE  Pregnancy, urine POC     Status: Abnormal   Collection Time: 02/20/16  3:03 PM  Result Value Ref Range   Preg Test, Ur POSITIVE (A) NEGATIVE  hCG, quantitative, pregnancy     Status: Abnormal   Collection Time: 02/20/16  3:35 PM  Result Value Ref Range   hCG, Beta Chain, Quant, S 407 (H) <5 mIU/mL  CBC     Status: None   Collection Time: 02/20/16  3:35 PM  Result Value Ref Range   WBC 6.3 4.0 - 10.5 K/uL   RBC 4.33 3.87 - 5.11 MIL/uL   Hemoglobin 13.0 12.0 - 15.0 g/dL   HCT 84.1 32.4 - 40.1 %   MCV 85.9 78.0 - 100.0 fL   MCH 30.0 26.0 - 34.0 pg   MCHC 34.9 30.0 - 36.0 g/dL   RDW 02.7 25.3 - 66.4 %   Platelets 271 150 - 400 K/uL  Wet prep, genital     Status: Abnormal   Collection Time: 02/20/16  3:38 PM  Result Value Ref Range   Yeast Wet Prep HPF POC NONE SEEN NONE SEEN   Trich, Wet Prep NONE SEEN NONE SEEN   Clue Cells Wet Prep HPF POC PRESENT (A) NONE SEEN   WBC, Wet Prep HPF POC FEW (A) NONE SEEN   Sperm NONE SEEN    US Ob Comp Less 14 Wks  Result Date: 02/20/2016 CLINICAL DATA:  Three weeks 6 days pregnant by last menstrual. Beta HCG of 407. Cramping since 8 days ago. EXAM: OBSTETRIC <14 WK [place] AND TRANSVAGINAL OB US TECHNIQUE: Both transabdominal and transvaginal ultrasound examinations were performed for complete evaluation of the gestation as well as the maternal uterus, adnexal regions, and pelvic cul-de-sac.  Transvaginal technique was performed to assess early pregnancy. COMPARISON:  None. FINDINGS: Intrauterine gestational sac: Absent Yolk sac:  Absent Embryo:  Absent Cardiac Activity: Absent Maternal uterus/adnexae: Both ovaries are within normal limits. No significant free fluid. IMPRESSION: Lack of visualization and yolk sac, fetal pole, or gestational sac. Given early gestational age and low beta HCG, differential considerations include early intrauterine pregnancy, missed abortion, or occult ectopic pregnancy. Short-term beta HCG and possibly ultrasound follow-up should be considered. Electronically Signed   By: Hosie Spangle.D.  On: 02/20/2016 17:49   US Ob Transvaginal  Result Date: 02/20/2016 CLINICAL DATA:  Three weeks 6 days pregnant by last menstrual. Beta HCG of 407. Cramping since 8 days ago. EXAM: OBSTETRIC <14 WK [place] AND TRANSVAGINAL OB US TECHNIQUE: Both transabdominal and transvaginal ultrasound examinations were performed for complete evaluation of the gestation as well as the maternal uterus, adnexal regions, and pelvic cul-de-sac. Transvaginal technique was performed to assess early pregnancy. COMPARISON:  None. FINDINGS: Intrauterine gestational sac: Absent Yolk sac:  Absent Embryo:  Absent Cardiac Activity: Absent Maternal uterus/adnexae: Both ovaries are within normal limits. No significant free fluid. IMPRESSION: Lack of visualization and yolk sac, fetal pole, or gestational sac. Given early gestational age and low beta HCG, differential considerations include early intrauterine pregnancy, missed abortion, or occult ectopic pregnancy. Short-term beta HCG and possibly ultrasound follow-up should be considered. Electronically Signed   By: Jeronimo Greaves M.D.   On: 02/20/2016 17:49   MAU Course  Procedures  MDM Labs and [place] ordered and reviewed. No evidence of UTI or pelvic infection. No evidence of IUP on [place], cannot r/o ectopic, early pregnancy, or failed pregnancy today. Stable for  discharge home with f/u.  Assessment and Plan   1. Pregnancy of unknown anatomic location   2. Cramping affecting pregnancy, antepartum    Discharge home Follow up in [facility] in 2 days for rpt quant Ectopic/SAB precautions Return for worsening sx  Allergies as of 02/20/2016   No Known Allergies     Medication List    STOP taking these medications   ALPRAZolam 0.25 MG tablet Commonly known as:  XANAX   VYVANSE 50 MG capsule Generic drug:  lisdexamfetamine     TAKE these medications   Lurasidone HCl 120 MG Tabs Commonly known as:  LATUDA Take 1 tablet (120 mg total) by mouth at bedtime. Reported on 04/23/2015   traZODone 100 MG tablet Commonly known as:  DESYREL Take 1 tablet (100 mg total) by mouth at bedtime.      Donette Larry, CNM 02/20/2016, 3:26 PM

## 2016-02-20 NOTE — MAU Note (Signed)
Cramping in lower belly and back area for 3 days, denies vaginal bleeding, noticed an increase in cervical discharge

## 2016-02-22 ENCOUNTER — Telehealth: Payer: Self-pay | Admitting: General Practice

## 2016-02-22 ENCOUNTER — Ambulatory Visit: Payer: Medicare Other | Admitting: General Practice

## 2016-02-22 DIAGNOSIS — O3680X0 Pregnancy with inconclusive fetal viability, not applicable or unspecified: Secondary | ICD-10-CM

## 2016-02-22 LAB — HCG, QUANTITATIVE, PREGNANCY: hCG, Beta Chain, Quant, S: 720 m[IU]/mL — ABNORMAL HIGH (ref <5)

## 2016-02-22 LAB — GC/CHLAMYDIA PROBE AMP (~~LOC~~) NOT AT ARMC
CHLAMYDIA, DNA PROBE: NEGATIVE
Neisseria Gonorrhea: NEGATIVE

## 2016-02-22 NOTE — Telephone Encounter (Signed)
Called patient & informed her of bhcg results & ultrasound appt. Patient verbalized understanding & had no questions

## 2016-02-22 NOTE — Progress Notes (Signed)
Patient here for stat bhcg today. Patient denies bleeding and reports improved pain- only slight pain now. Per Judeth HornErin Lawrence, patient does not need to wait for results. Can be contacted by phone. Patient left contact number [phone]. Spoke with Dr Shawnie PonsPratt regarding results who agrees in favorable rise. Patient needs follow up ultrasound in 2 weeks. Scheduled for 1/29 @ 9am. Will call patient with results.

## 2016-03-07 ENCOUNTER — Ambulatory Visit: Payer: Medicare Other | Admitting: *Deleted

## 2016-03-07 ENCOUNTER — Ambulatory Visit (HOSPITAL_COMMUNITY)
Admission: RE | Admit: 2016-03-07 | Discharge: 2016-03-07 | Disposition: A | Discharge status: Home / Self Care | Payer: Medicare Other | Source: Ambulatory Visit | Attending: Family Medicine | Admitting: Family Medicine

## 2016-03-07 ENCOUNTER — Encounter: Payer: Self-pay | Admitting: *Deleted

## 2016-03-07 DIAGNOSIS — O3680X0 Pregnancy with inconclusive fetal viability, not applicable or unspecified: Secondary | ICD-10-CM

## 2016-03-07 DIAGNOSIS — Z712 Person consulting for explanation of examination or test findings: Secondary | ICD-10-CM

## 2016-03-07 DIAGNOSIS — Z3A01 Less than 8 weeks gestation of pregnancy: Secondary | ICD-10-CM | POA: Diagnosis not present

## 2016-03-07 DIAGNOSIS — Z3689 Encounter for other specified antenatal screening: Secondary | ICD-10-CM | POA: Insufficient documentation

## 2016-03-07 NOTE — Progress Notes (Signed)
Patient presents to clinic for u/s results. Gestational sac, yolk sac and embryo seen FHR 125. Advised patient of favorable results. Reviewed allergies and medications. Patient is on several meds. Recommended that she consult immediately with her prescribing psychiatrist to determine safety for fetus. Understanding voiced. Pregnancy verification letter given. Patient to schedule initial prenatal visit asap.

## 2016-04-11 ENCOUNTER — Other Ambulatory Visit (HOSPITAL_COMMUNITY): Payer: Self-pay | Admitting: Nurse Practitioner

## 2016-04-11 DIAGNOSIS — Z3A12 12 weeks gestation of pregnancy: Secondary | ICD-10-CM

## 2016-04-11 DIAGNOSIS — Z3682 Encounter for antenatal screening for nuchal translucency: Secondary | ICD-10-CM

## 2016-04-13 ENCOUNTER — Inpatient Hospital Stay (HOSPITAL_COMMUNITY)
Admission: AD | Admit: 2016-04-13 | Discharge: 2016-04-13 | Disposition: A | Discharge status: Home / Self Care | Payer: Medicare Other | Source: Ambulatory Visit | Attending: Obstetrics & Gynecology | Admitting: Obstetrics & Gynecology

## 2016-04-13 ENCOUNTER — Encounter (HOSPITAL_COMMUNITY): Payer: Self-pay

## 2016-04-13 DIAGNOSIS — O99331 Smoking (tobacco) complicating pregnancy, first trimester: Secondary | ICD-10-CM | POA: Insufficient documentation

## 2016-04-13 DIAGNOSIS — Z3A11 11 weeks gestation of pregnancy: Secondary | ICD-10-CM | POA: Insufficient documentation

## 2016-04-13 DIAGNOSIS — F17210 Nicotine dependence, cigarettes, uncomplicated: Secondary | ICD-10-CM | POA: Diagnosis not present

## 2016-04-13 DIAGNOSIS — R109 Unspecified abdominal pain: Secondary | ICD-10-CM | POA: Diagnosis present

## 2016-04-13 DIAGNOSIS — N949 Unspecified condition associated with female genital organs and menstrual cycle: Secondary | ICD-10-CM

## 2016-04-13 DIAGNOSIS — O209 Hemorrhage in early pregnancy, unspecified: Secondary | ICD-10-CM | POA: Insufficient documentation

## 2016-04-13 LAB — URINALYSIS, ROUTINE W REFLEX MICROSCOPIC
BILIRUBIN URINE: NEGATIVE
Glucose, UA: NEGATIVE mg/dL
Hgb urine dipstick: NEGATIVE
Ketones, ur: 20 mg/dL — AB
Leukocytes, UA: NEGATIVE
NITRITE: NEGATIVE
PH: 5 (ref 5.0–8.0)
Protein, ur: NEGATIVE mg/dL
SPECIFIC GRAVITY, URINE: 1.029 (ref 1.005–1.030)

## 2016-04-13 NOTE — MAU Note (Signed)
Went to the bathroom, when she wiped she saw a gooey brown d/c,  Had an odor.  Having sharp pains in lower abd, started on RLQ about a wk ago. Took some castor oil on Monday, had results yesterday, but since then has been feeling weird, is afraid she has hurt the baby..  Has urge to urinate, but nothing comes

## 2016-04-13 NOTE — MAU Provider Note (Signed)
History     CSN: [id number]  Arrival date and time: 04/13/16 1825   First Provider Initiated Contact with Patient 04/13/16 1859      Chief Complaint  Patient presents with  . Abdominal Pain   HPI Nichole Lopez is a 31 y.o. [id number] at [redacted]w[redacted]d who presents to MAU today with complaint of pelvic pain and vaginal bleeding today. The patient states that she noted one episode of bleeding this morning. She has not seen anymore since then and has not needed a pad. She had a pelvic exam on Monday at the [facility] and had a pelvic exam at that time. She denies vaginal discharge, fever, N/V/D today. She has had intermittent lower abdominal pain. She has not taken anything for pain. Pain is worse with ambulation and standing. She also states recent issues with constipation. She took castor oil for this the other night.    OB History    Gravida Para Term Preterm AB Living   4 2 2   1 2    SAB TAB Ectopic Multiple Live Births   1     0 2      Past Medical History:  Diagnosis Date  . Asthma   . Bipolar 1 disorder (HCC)   . Deliberate self-cutting   . Depression   . Mental disorder PTSD Depression Schizophrenic disorder,  anxiety  . PTSD (post-traumatic stress disorder)   . Suicidal ideation     Past Surgical History:  Procedure Laterality Date  . FOOT SURGERY      Family History  Problem Relation Age of Onset  . Depression Maternal Uncle   . Diabetes Other   . Congenital heart disease Daughter     no surgical correction required at this time    Social History  Substance Use Topics  . Smoking status: Current Every Day Smoker    Packs/day: 1.00    Years: 10.00    Types: Cigarettes  . Smokeless tobacco: Never Used  . Alcohol use Yes     Comment: socially    Allergies: No Known Allergies  Prescriptions Prior to Admission  Medication Sig Dispense Refill Last Dose  . buPROPion (WELLBUTRIN XL) 300 MG 24 hr tablet Take 300 mg by mouth daily.   Taking  . lisdexamfetamine  (VYVANSE) 70 MG capsule Take 70 mg by mouth daily.   Taking  . Lurasidone HCl (LATUDA) 120 MG TABS Take 1 tablet (120 mg total) by mouth at bedtime. Reported on 04/23/2015 (Patient not taking: Reported on 03/07/2016) 30 tablet 0 Not Taking  . traZODone (DESYREL) 100 MG tablet Take 1 tablet (100 mg total) by mouth at bedtime. 30 tablet 0 Taking    Review of Systems  Constitutional: Negative for fever.  Gastrointestinal: Positive for abdominal pain. Negative for constipation, diarrhea, nausea and vomiting.  Genitourinary: Positive for vaginal bleeding. Negative for dysuria, frequency, urgency and vaginal discharge.   Physical Exam   Blood pressure 125/80, pulse 91, temperature 98.9 F (37.2 C), temperature source Oral, resp. rate 16, weight 220 lb 12 oz (100.1 kg), last menstrual period 01/24/2016, SpO2 100 %, not currently breastfeeding.  Physical Exam  Nursing note and vitals reviewed. Constitutional: She is oriented to person, place, and time. She appears well-developed and well-nourished. No distress.  HENT:  Head: Normocephalic and atraumatic.  Cardiovascular: Normal rate.   Respiratory: Effort normal.  GI: Soft. She exhibits no distension and no mass. There is no tenderness. There is no rebound and no guarding.  Genitourinary: Uterus  is enlarged (appropriate for GA). Uterus is not tender. Cervix exhibits no motion tenderness, no discharge and no friability. No bleeding in the vagina. Vaginal discharge (small amount of thin, white discharge noted) found.  Neurological: She is alert and oriented to person, place, and time.  Skin: Skin is warm and dry. No erythema.  Psychiatric: She has a normal mood and affect.  Dilation: Closed Effacement (%): Thick Cervical Position: Posterior Exam by:: Magnus SinningWenzel, PA-C  Results for orders placed or performed during the hospital encounter of 04/13/16 (from the past 24 hour(s))  Urinalysis, Routine w reflex microscopic     Status: Abnormal   Collection  Time: 04/13/16  6:40 PM  Result Value Ref Range   Color, Urine YELLOW YELLOW   APPearance HAZY (A) CLEAR   Specific Gravity, Urine 1.029 1.005 - 1.030   pH 5.0 5.0 - 8.0   Glucose, UA NEGATIVE NEGATIVE mg/dL   Hgb urine dipstick NEGATIVE NEGATIVE   Bilirubin Urine NEGATIVE NEGATIVE   Ketones, ur 20 (A) NEGATIVE mg/dL   Protein, ur NEGATIVE NEGATIVE mg/dL   Nitrite NEGATIVE NEGATIVE   Leukocytes, UA NEGATIVE NEGATIVE    MAU Course  Procedures None  MDM FHR - 160 bpm with doppler UA today   Assessment and Plan  A: SIUP at [id number] Vaginal bleeding in pregnancy, first trimester Round ligament pain  P: Discharge home Tylenol and abdominal binder advised for abdominal pain Bleeding precautions discussed Patient given list of safe OTC medications to take in pregnancy Advised not to take Castor Oil in pregnancy Patient advised to follow-up with [facility] as scheduled for routine prenatal care Patient may return to MAU as needed or if her condition were to change or worsen  Marny LowensteinJulie N Charmika Macdonnell, PA-C  04/13/2016, 6:59 PM

## 2016-04-13 NOTE — Discharge Instructions (Signed)
Round Ligament Pain The round ligament is a cord of muscle and tissue that helps to support the uterus. It can become a source of pain during pregnancy if it becomes stretched or twisted as the baby grows. The pain usually begins in the second trimester of pregnancy, and it can come and go until the baby is delivered. It is not a serious problem, and it does not cause harm to the baby. Round ligament pain is usually a short, sharp, and pinching pain, but it can also be a dull, lingering, and aching pain. The pain is felt in the lower side of the abdomen or in the groin. It usually starts deep in the groin and moves up to the outside of the hip area. Pain can occur with:  A sudden change in position.  Rolling over in bed.  Coughing or sneezing.  Physical activity. Follow these instructions at home: Watch your condition for any changes. Take these steps to help with your pain:  When the pain starts, relax. Then try:  Sitting down.  Flexing your knees up to your abdomen.  Lying on your side with one pillow under your abdomen and another pillow between your legs.  Sitting in a warm bath for 15-20 minutes or until the pain goes away.  Take over-the-counter and prescription medicines only as told by your health care provider.  Move slowly when you sit and stand.  Avoid long walks if they cause pain.  Stop or lessen your physical activities if they cause pain. Contact a health care provider if:  Your pain does not go away with treatment.  You feel pain in your back that you did not have before.  Your medicine is not helping. Get help right away if:  You develop a fever or chills.  You develop uterine contractions.  You develop vaginal bleeding.  You develop nausea or vomiting.  You develop diarrhea.  You have pain when you urinate. This information is not intended to replace advice given to you by your health care provider. Make sure you discuss any questions you have with  your health care provider. Document Released: 11/03/2007 Document Revised: 07/02/2015 Document Reviewed: 04/02/2014 Elsevier Interactive Patient Education  2017 [employer]  

## 2016-04-15 ENCOUNTER — Encounter (HOSPITAL_COMMUNITY): Payer: Self-pay | Admitting: Nurse Practitioner

## 2016-04-21 ENCOUNTER — Ambulatory Visit (HOSPITAL_COMMUNITY)
Admission: RE | Admit: 2016-04-21 | Discharge: 2016-04-21 | Disposition: A | Discharge status: Home / Self Care | Payer: Medicare Other | Source: Ambulatory Visit | Attending: Nurse Practitioner | Admitting: Nurse Practitioner

## 2016-04-21 ENCOUNTER — Other Ambulatory Visit: Payer: Self-pay

## 2016-04-21 ENCOUNTER — Encounter (HOSPITAL_COMMUNITY): Payer: Self-pay

## 2016-04-21 ENCOUNTER — Other Ambulatory Visit (HOSPITAL_COMMUNITY): Payer: Medicare Other

## 2016-04-21 DIAGNOSIS — O99341 Other mental disorders complicating pregnancy, first trimester: Secondary | ICD-10-CM | POA: Diagnosis not present

## 2016-04-21 DIAGNOSIS — Z3682 Encounter for antenatal screening for nuchal translucency: Secondary | ICD-10-CM | POA: Insufficient documentation

## 2016-04-21 DIAGNOSIS — Z3A12 12 weeks gestation of pregnancy: Secondary | ICD-10-CM | POA: Diagnosis not present

## 2017-01-22 ENCOUNTER — Encounter (HOSPITAL_COMMUNITY): Payer: Self-pay

## 2017-03-27 IMAGING — US US OB TRANSVAGINAL
1 series · 15 of 28 positions shown · non-contrast
Comparison: None.

CLINICAL DATA: Pregnant patient with lower abdominal cramping and
back pain beginning 11/03/2014. Quantitative HCG pending.

EXAM:
OBSTETRIC <14 WK US AND TRANSVAGINAL OB US
TECHNIQUE: Both transabdominal and transvaginal ultrasound examinations were
performed for complete evaluation of the gestation as well as the
maternal uterus, adnexal regions, and pelvic cul-de-sac.
Transvaginal technique was performed to assess early pregnancy.

[Series 1: us ob transvaginal · 59 acquisitions, 15 frames shown]
[im 1/59]
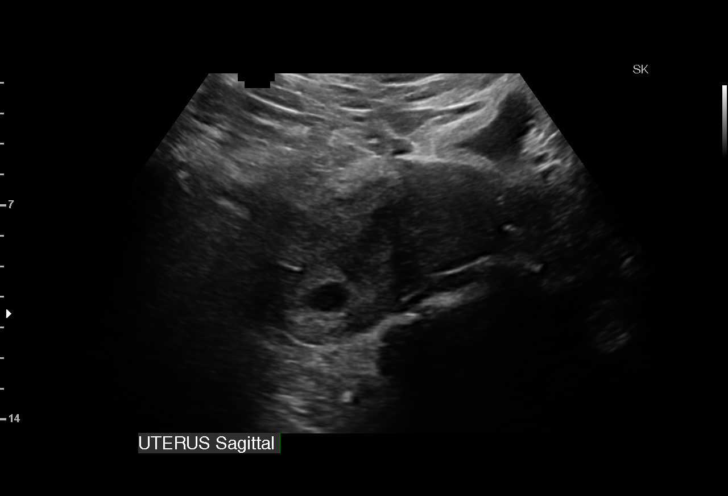
[im 5/59]
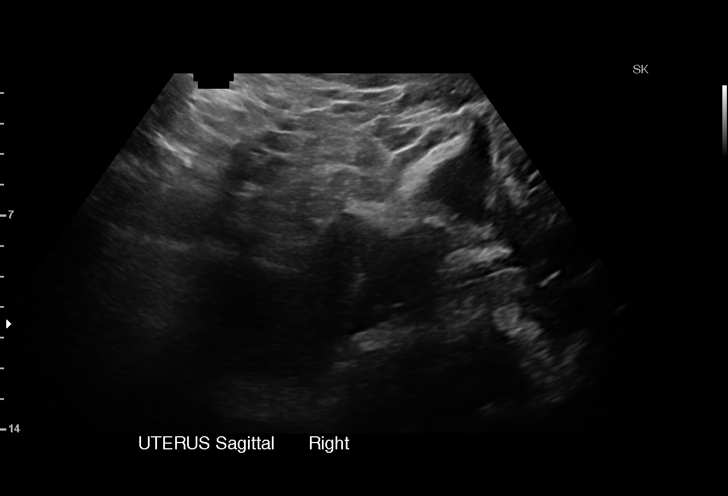
[im 9/59]
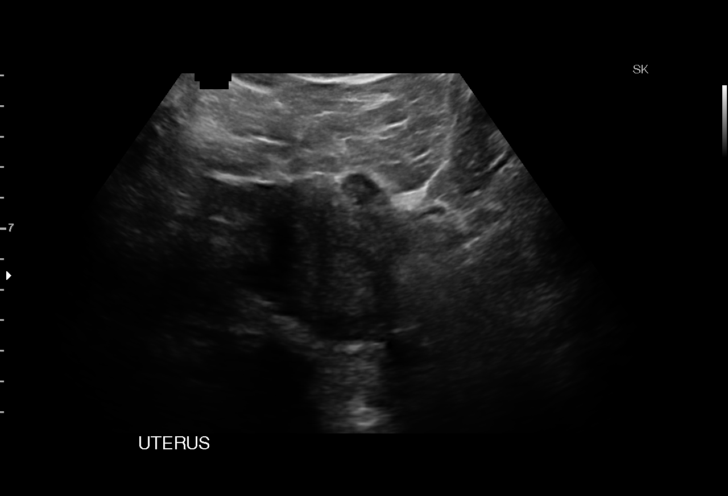
[im 13/59]
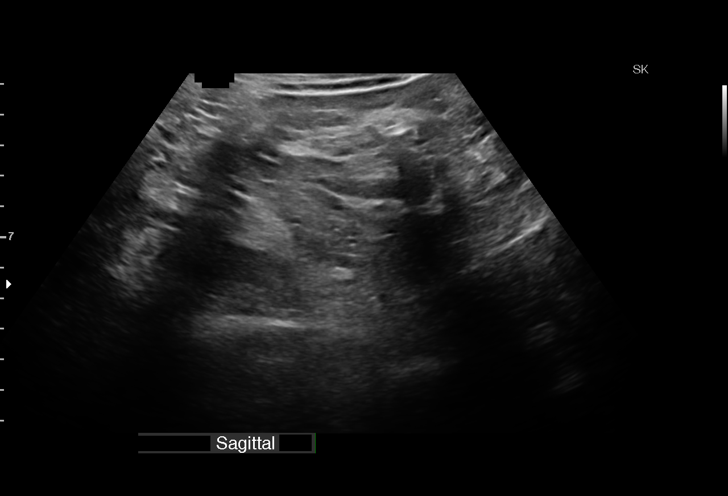
[im 18/59]
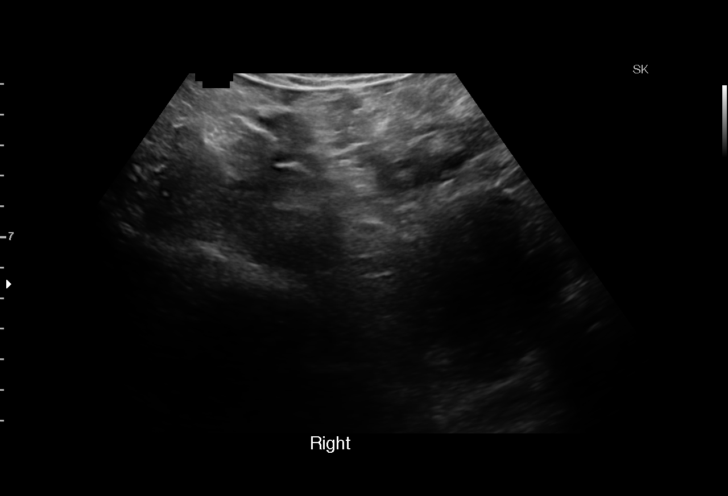
[im 22/59]
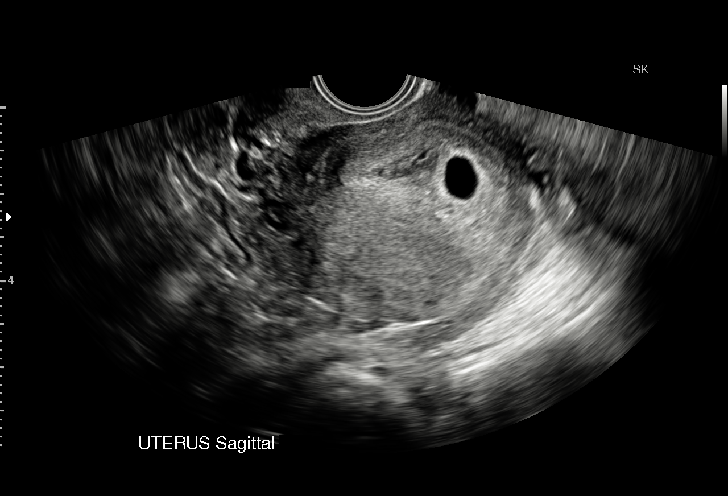
[im 26/59]
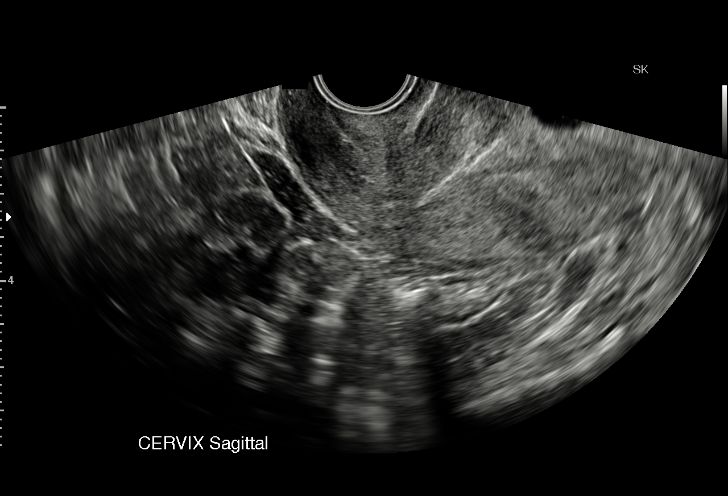
[im 31/59]
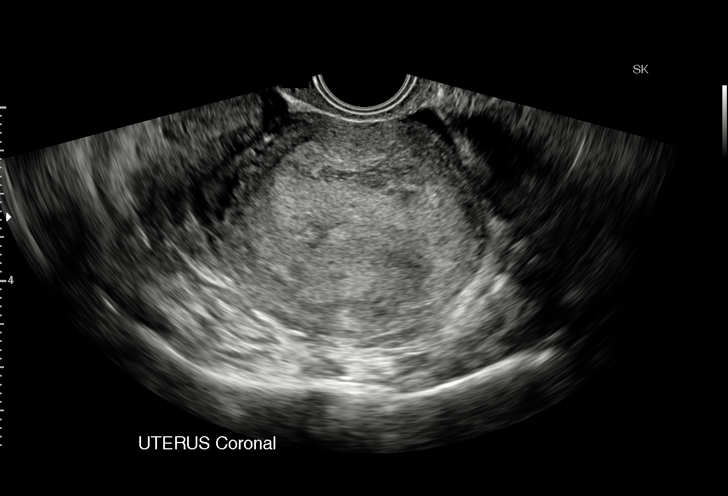
[im 33/59]
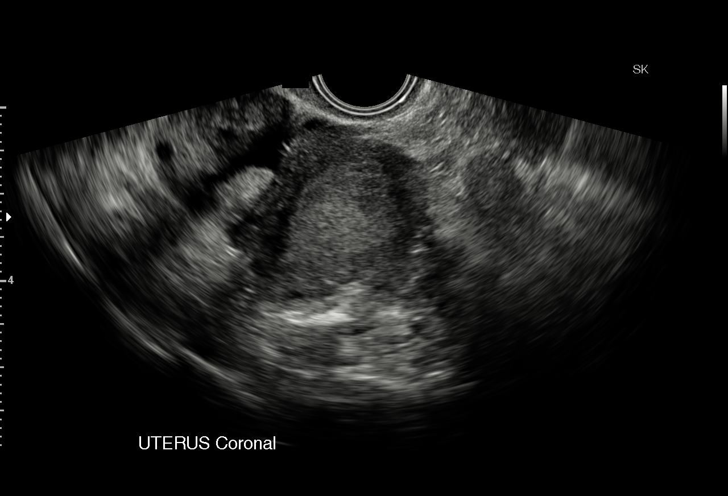
[im 37/59]
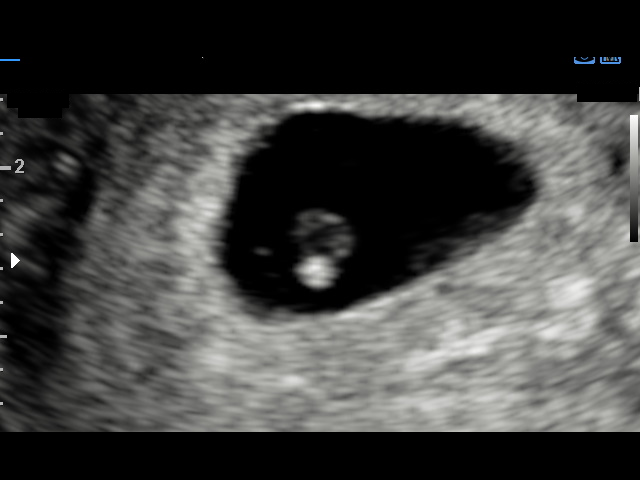
[im 41/59]
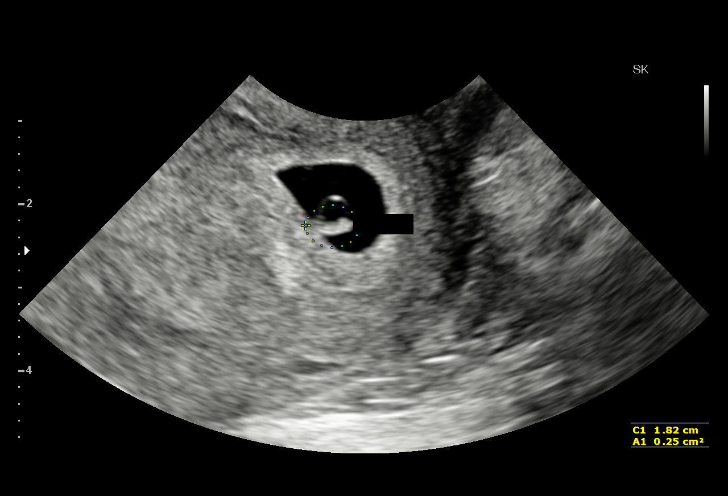
[im 46/59]
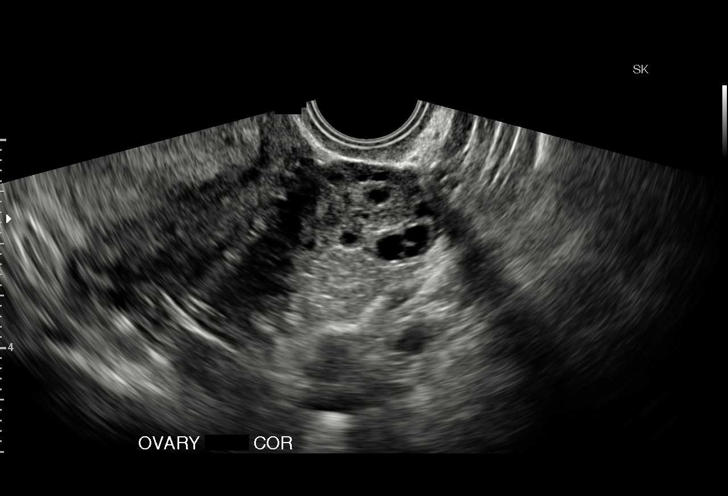
[im 50/59]
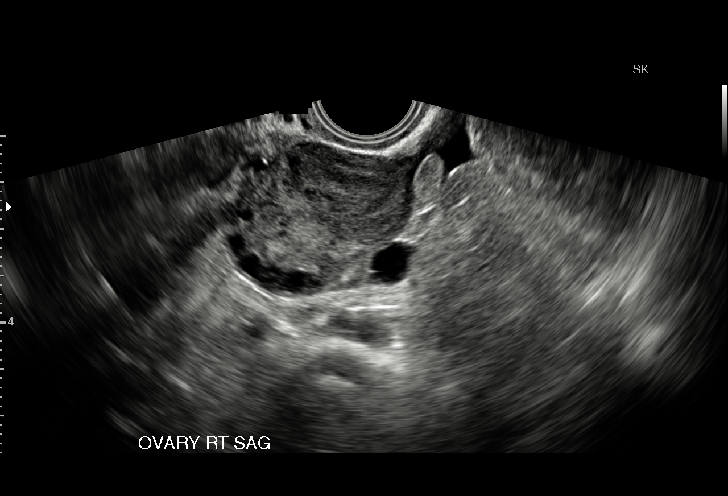
[im 54/59]
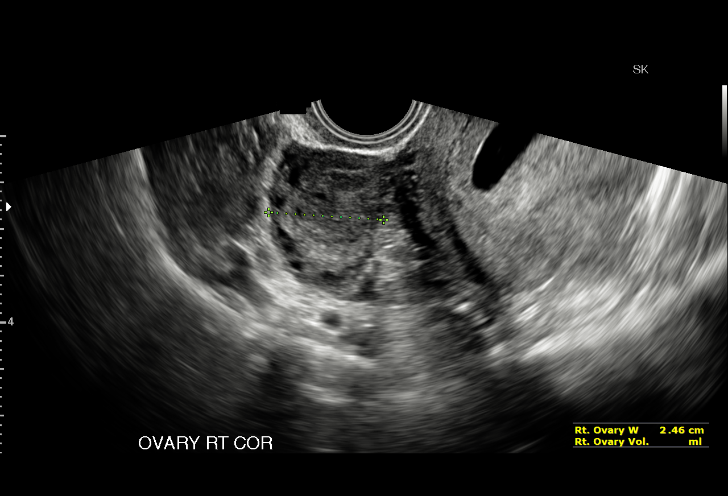
[im 59/59]
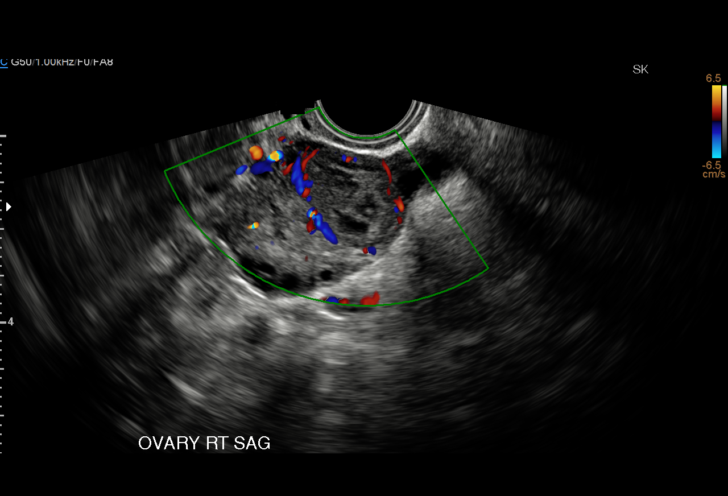

[15 of 28 positions shown; findings below may reference images not displayed]

FINDINGS: Intrauterine gestational sac: Visualized/normal in shape.

Yolk sac:  Visualized.

Embryo:  Visualized.

Cardiac Activity: Detected.

Heart Rate: Cannot be traced due to the embryo's small size.

CRL:  3.6  mm   6 w   0 d                  US EDC: 06/30/2015

Maternal uterus/adnexae: Unremarkable. Corpus luteum cyst on the
right incidentally noted. A small volume of free pelvic fluid is
seen.
IMPRESSION: Single living anterior pregnancy.  No acute abnormality.

## 2019-06-07 ENCOUNTER — Telehealth: Payer: Self-pay

## 2019-06-07 NOTE — Telephone Encounter (Signed)
Patient called with question regarding her positive covid. Patient was advised that she can take over the counter medications to treat her symptoms but if she is having severe symptoms such as sob, chest pain, weakness, or dehydration to report to ED. Patient verbalized understanding
